# Patient Record
Sex: Male | Born: 1960
Health system: Southern US, Community
[De-identification: ages and names within clinical notes are randomized; demographics above are authoritative.]

## PROBLEM LIST (undated history)

## (undated) DIAGNOSIS — M1990 Unspecified osteoarthritis, unspecified site: Secondary | ICD-10-CM

## (undated) DIAGNOSIS — R0602 Shortness of breath: Secondary | ICD-10-CM

## (undated) DIAGNOSIS — K219 Gastro-esophageal reflux disease without esophagitis: Secondary | ICD-10-CM

## (undated) DIAGNOSIS — G4730 Sleep apnea, unspecified: Secondary | ICD-10-CM

## (undated) DIAGNOSIS — E669 Obesity, unspecified: Secondary | ICD-10-CM

## (undated) DIAGNOSIS — Z87442 Personal history of urinary calculi: Secondary | ICD-10-CM

## (undated) DIAGNOSIS — I10 Essential (primary) hypertension: Secondary | ICD-10-CM

## (undated) HISTORY — DX: Essential (primary) hypertension: I10

## (undated) HISTORY — PX: URETHRAL DILATION: SUR417

---

## 2000-09-17 ENCOUNTER — Ambulatory Visit (HOSPITAL_BASED_OUTPATIENT_CLINIC_OR_DEPARTMENT_OTHER): Admission: RE | Admit: 2000-09-17 | Discharge: 2000-09-17 | Payer: Self-pay

## 2002-04-09 ENCOUNTER — Ambulatory Visit (HOSPITAL_BASED_OUTPATIENT_CLINIC_OR_DEPARTMENT_OTHER): Admission: RE | Admit: 2002-04-09 | Discharge: 2002-04-09 | Payer: Self-pay

## 2005-02-13 ENCOUNTER — Ambulatory Visit: Payer: Self-pay | Admitting: Internal Medicine

## 2008-05-05 ENCOUNTER — Emergency Department (HOSPITAL_COMMUNITY): Admission: EM | Admit: 2008-05-05 | Discharge: 2008-05-05 | Payer: Self-pay | Admitting: Emergency Medicine

## 2009-01-25 ENCOUNTER — Ambulatory Visit (HOSPITAL_BASED_OUTPATIENT_CLINIC_OR_DEPARTMENT_OTHER): Admission: RE | Admit: 2009-01-25 | Discharge: 2009-01-25 | Payer: Self-pay | Admitting: Family Medicine

## 2011-04-13 NOTE — Procedures (Signed)
Corey [name], Corey [name]              ACCOUNT NO.:  [id number]   MEDICAL RECORD NO.:  [id number]          PATIENT TYPE:  OUT   LOCATION:  SLEEP CENTER                 FACILITY:  [facility]   PHYSICIAN:  Clinton D. Maple Hudson, MD, FCCP, FACPDATE OF BIRTH:  02-25-61   DATE OF STUDY:                            NOCTURNAL POLYSOMNOGRAM   REFERRING PHYSICIAN:  Tanya D. Daphine Deutscher, M.D.   INDICATIONS FOR STUDY:  Hypersomnia with sleep apnea.  Epworth  sleepiness score 20/24, BMI 35.4, weight 240 pounds, height 69 inches,  neck 17.5 inches.  Home medications are charted and reviewed.   SLEEP ARCHITECTURE:  Split study protocol.  During the diagnostic phase,  total sleep time was 140 minutes with sleep efficiency 90.3%.  Stage I  was 9.6%, stage II 90.4%.  Stages III and REM were absent.  Sleep  latency 3.5 minutes, awake after sleep onset 7 minutes, arousal index  67.3 indicating increased EEG arousal.  No bedtime medication was taken.   RESPIRATORY DATA:  Apnea/hypopnea index (AHI) 95.1 per hour.  A total of  222 events was scored including 63 obstructive apneas and 159 hypopneas.  Sleep position was indicated as supine.  CPAP was then titrated to 14  CWP, AHI zero per hour.  He wore a large ResMed Mirage micro-nasal mask  with chin strap and heated humidifier.   OXYGEN DATA:  Loud snoring before CPAP with oxygen desaturation to a  nadir of 81%.  After CPAP control, snoring was prevented and mean oxygen  saturation held 93.4% on room air.   CARDIAC DATA:  Normal sinus rhythm.   MOVEMENT/PARASOMNIA:  No significant movement disturbance.  No bathroom  trips.   IMPRESSION/RECOMMENDATION:  1. Severe obstructive sleep apnea/hypopnea syndrome, AHI 95.1 per      hour.  Loud snoring with oxygen desaturation to a nadir of 81% on      room air.  2. Successful CPAP titration to 14 CWP, AHI zero per hour.  He used a      Museum/gallery curator micro-nasal mask with chin strap and heated       humidifier.      Clinton D. Maple Hudson, MD, [facility], FACP  Diplomate, Biomedical engineer of Sleep Medicine  Electronically Signed     CDY/MEDQ  D:  01/29/2009 10:40:31  T:  01/30/2009 00:37:51  Job:  [id number]

## 2011-08-23 LAB — DIFFERENTIAL
Basophils Absolute: 0.1
Basophils Relative: 1
Eosinophils Absolute: 0
Eosinophils Relative: 0
Lymphocytes Relative: 9 — ABNORMAL LOW
Lymphs Abs: 1.3
Monocytes Absolute: 1.1 — ABNORMAL HIGH
Monocytes Relative: 7
Neutro Abs: 12.5 — ABNORMAL HIGH
Neutrophils Relative %: 84 — ABNORMAL HIGH

## 2011-08-23 LAB — CBC
HCT: 45.4
Hemoglobin: 15.9
MCHC: 34.9
MCV: 91.5
Platelets: 313
RBC: 4.97
RDW: 13.1
WBC: 15 — ABNORMAL HIGH

## 2011-08-23 LAB — URINALYSIS, ROUTINE W REFLEX MICROSCOPIC
Bilirubin Urine: NEGATIVE
Glucose, UA: NEGATIVE
Hgb urine dipstick: NEGATIVE
Ketones, ur: 15 — AB
Nitrite: NEGATIVE
Protein, ur: NEGATIVE
Specific Gravity, Urine: 1.025
Urobilinogen, UA: 0.2
pH: 5.5

## 2011-08-23 LAB — BASIC METABOLIC PANEL
BUN: 16
CO2: 23
Calcium: 9.4
Chloride: 109
Creatinine, Ser: 1.55 — ABNORMAL HIGH
GFR calc Af Amer: 59 — ABNORMAL LOW
GFR calc non Af Amer: 49 — ABNORMAL LOW
Glucose, Bld: 117 — ABNORMAL HIGH
Potassium: 4.8
Sodium: 140

## 2013-09-28 ENCOUNTER — Encounter: Payer: Self-pay | Admitting: Gastroenterology

## 2013-10-06 ENCOUNTER — Ambulatory Visit: Payer: Self-pay | Admitting: Gastroenterology

## 2013-12-04 ENCOUNTER — Ambulatory Visit: Payer: Self-pay | Admitting: Gastroenterology

## 2013-12-14 DIAGNOSIS — F411 Generalized anxiety disorder: Secondary | ICD-10-CM

## 2013-12-14 DIAGNOSIS — J209 Acute bronchitis, unspecified: Secondary | ICD-10-CM

## 2013-12-14 DIAGNOSIS — E291 Testicular hypofunction: Secondary | ICD-10-CM

## 2013-12-14 DIAGNOSIS — I10 Essential (primary) hypertension: Secondary | ICD-10-CM | POA: Insufficient documentation

## 2013-12-14 HISTORY — DX: Essential (primary) hypertension: I10

## 2013-12-14 HISTORY — DX: Acute bronchitis, unspecified: J20.9

## 2013-12-14 HISTORY — DX: Generalized anxiety disorder: F41.1

## 2013-12-14 HISTORY — DX: Testicular hypofunction: E29.1

## 2013-12-28 ENCOUNTER — Ambulatory Visit: Payer: Self-pay | Admitting: Gastroenterology

## 2014-01-14 ENCOUNTER — Encounter (HOSPITAL_BASED_OUTPATIENT_CLINIC_OR_DEPARTMENT_OTHER): Payer: Self-pay | Admitting: Emergency Medicine

## 2014-01-14 ENCOUNTER — Emergency Department (HOSPITAL_BASED_OUTPATIENT_CLINIC_OR_DEPARTMENT_OTHER)
Admission: EM | Admit: 2014-01-14 | Discharge: 2014-01-14 | Disposition: A | Discharge status: Home / Self Care | Payer: Worker's Compensation | Attending: Emergency Medicine | Admitting: Emergency Medicine

## 2014-01-14 ENCOUNTER — Emergency Department (HOSPITAL_BASED_OUTPATIENT_CLINIC_OR_DEPARTMENT_OTHER): Payer: Worker's Compensation

## 2014-01-14 DIAGNOSIS — Y9389 Activity, other specified: Secondary | ICD-10-CM | POA: Insufficient documentation

## 2014-01-14 DIAGNOSIS — S139XXA Sprain of joints and ligaments of unspecified parts of neck, initial encounter: Secondary | ICD-10-CM | POA: Insufficient documentation

## 2014-01-14 DIAGNOSIS — I10 Essential (primary) hypertension: Secondary | ICD-10-CM | POA: Insufficient documentation

## 2014-01-14 DIAGNOSIS — Z79899 Other long term (current) drug therapy: Secondary | ICD-10-CM | POA: Insufficient documentation

## 2014-01-14 DIAGNOSIS — S39012A Strain of muscle, fascia and tendon of lower back, initial encounter: Secondary | ICD-10-CM

## 2014-01-14 DIAGNOSIS — S161XXA Strain of muscle, fascia and tendon at neck level, initial encounter: Secondary | ICD-10-CM

## 2014-01-14 DIAGNOSIS — Y92410 Unspecified street and highway as the place of occurrence of the external cause: Secondary | ICD-10-CM | POA: Insufficient documentation

## 2014-01-14 DIAGNOSIS — S335XXA Sprain of ligaments of lumbar spine, initial encounter: Secondary | ICD-10-CM | POA: Insufficient documentation

## 2014-01-14 MED ORDER — OXYCODONE-ACETAMINOPHEN 5-325 MG PO TABS
2.0000 | ORAL_TABLET | Freq: Once | ORAL | Status: AC
  Administered 2014-01-14: 2 via ORAL
  Filled 2014-01-14: qty 2

## 2014-01-14 MED ORDER — OXYCODONE-ACETAMINOPHEN 5-325 MG PO TABS: 1.0000 | ORAL_TABLET | Freq: Four times a day (QID) | ORAL | Status: DC | PRN

## 2014-01-14 MED ORDER — NAPROXEN 500 MG PO TABS: 500.0000 mg | ORAL_TABLET | Freq: Two times a day (BID) | ORAL | Status: DC
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
## 2014-01-14 NOTE — Discharge Instructions (Signed)
Recommend you take naproxen as prescribed for symptoms. Apply ice to your sore areas 3-4 times per day to lessen inflammation. You may take Percocet as prescribed for breakthrough pain control. Followup with your primary care doctor. Refrain from strenuous activity or heavy lifting for one week. Return to the emergency department if symptoms worsen such as if you develop bowel or bladder incontinence, numbness of your inner thighs or genital region, or an inability to walk.  Cervical Sprain A cervical sprain is an injury in the neck in which the strong, fibrous tissues (ligaments) that connect your neck bones stretch or tear. Cervical sprains can range from mild to severe. Severe cervical sprains can cause the neck vertebrae to be unstable. This can lead to damage of the spinal cord and can result in serious nervous system problems. The amount of time it takes for a cervical sprain to get better depends on the cause and extent of the injury. Most cervical sprains heal in 1 to 3 weeks. CAUSES  Severe cervical sprains may be caused by:   Contact sport injuries (such as from football, rugby, wrestling, hockey, auto racing, gymnastics, diving, martial arts, or boxing).   Motor vehicle collisions.   Whiplash injuries. This is an injury from a sudden forward-and backward whipping movement of the head and neck.  Falls.  Mild cervical sprains may be caused by:   Being in an awkward position, such as while cradling a telephone between your ear and shoulder.   Sitting in a chair that does not offer proper support.   Working at a poorly Marketing executivedesigned computer station.   Looking up or down for long periods of time.  SYMPTOMS   Pain, soreness, stiffness, or a burning sensation in the front, back, or sides of the neck. This discomfort may develop immediately after the injury or slowly, 24 hours or more after the injury.   Pain or tenderness directly in the middle of the back of the neck.    Shoulder or upper back pain.   Limited ability to move the neck.   Headache.   Dizziness.   Weakness, numbness, or tingling in the hands or arms.   Muscle spasms.   Difficulty swallowing or chewing.   Tenderness and swelling of the neck.  DIAGNOSIS  Most of the time your health care provider can diagnose a cervical sprain by taking your history and doing a physical exam. Your health care provider will ask about previous neck injuries and any known neck problems, such as arthritis in the neck. X-rays may be taken to find out if there are any other problems, such as with the bones of the neck. Other tests, such as a CT scan or MRI, may also be needed.  TREATMENT  Treatment depends on the severity of the cervical sprain. Mild sprains can be treated with rest, keeping the neck in place (immobilization), and pain medicines. Severe cervical sprains are immediately immobilized. Further treatment is done to help with pain, muscle spasms, and other symptoms and may include:  Medicines, such as pain relievers, numbing medicines, or muscle relaxants.   Physical therapy. This may involve stretching exercises, strengthening exercises, and posture training. Exercises and improved posture can help stabilize the neck, strengthen muscles, and help stop symptoms from returning.  HOME CARE INSTRUCTIONS   Put ice on the injured area.   Put ice in a plastic bag.   Place a towel between your skin and the bag.   Leave the ice on for 15 20  minutes, 3 4 times a day.   If your injury was severe, you may have been given a cervical collar to wear. A cervical collar is a two-piece collar designed to keep your neck from moving while it heals.  Do not remove the collar unless instructed by your health care provider.  If you have long hair, keep it outside of the collar.  Ask your health care provider before making any adjustments to your collar. Minor adjustments may be required over time  to improve comfort and reduce pressure on your chin or on the back of your head.  Ifyou are allowed to remove the collar for cleaning or bathing, follow your health care provider's instructions on how to do so safely.  Keep your collar clean by wiping it with mild soap and water and drying it completely. If the collar you have been given includes removable pads, remove them every 1 2 days and hand wash them with soap and water. Allow them to air dry. They should be completely dry before you wear them in the collar.  If you are allowed to remove the collar for cleaning and bathing, wash and dry the skin of your neck. Check your skin for irritation or sores. If you see any, tell your health care provider.  Do not drive while wearing the collar.   Only take over-the-counter or prescription medicines for pain, discomfort, or fever as directed by your health care provider.   Keep all follow-up appointments as directed by your health care provider.   Keep all physical therapy appointments as directed by your health care provider.   Make any needed adjustments to your workstation to promote good posture.   Avoid positions and activities that make your symptoms worse.   Warm up and stretch before being active to help prevent problems.  SEEK MEDICAL CARE IF:   Your pain is not controlled with medicine.   You are unable to decrease your pain medicine over time as planned.   Your activity level is not improving as expected.  SEEK IMMEDIATE MEDICAL CARE IF:   You develop any bleeding.  You develop stomach upset.  You have signs of an allergic reaction to your medicine.   Your symptoms get worse.   You develop new, unexplained symptoms.   You have numbness, tingling, weakness, or paralysis in any part of your body.  MAKE SURE YOU:   Understand these instructions.  Will watch your condition.  Will get help right away if you are not doing well or get worse. Document  Released: 09/09/2007 Document Revised: 09/02/2013 Document Reviewed: 05/20/2013 [facility] Patient Information 2014 [place], [place].  Back Pain, Adult Back pain is very common. The pain often gets better over time. The cause of back pain is usually not dangerous. Most people can learn to manage their back pain on their own.  HOME CARE   Stay active. Start with short walks on flat ground if you can. Try to walk farther each day.  Do not sit, drive, or stand in one place for more than 30 minutes. Do not stay in bed.  Do not avoid exercise or work. Activity can help your back heal faster.  Be careful when you bend or lift an object. Bend at your knees, keep the object close to you, and do not twist.  Sleep on a firm mattress. Lie on your side, and bend your knees. If you lie on your back, put a pillow under your knees.  Only take medicines  as told by your doctor.  Put ice on the injured area.  Put ice in a plastic bag.  Place a towel between your skin and the bag.  Leave the ice on for 15-20 minutes, 03-04 times a day for the first 2 to 3 days. After that, you can switch between ice and heat packs.  Ask your doctor about back exercises or massage.  Avoid feeling anxious or stressed. Find good ways to deal with stress, such as exercise. GET HELP RIGHT AWAY IF:   Your pain does not go away with rest or medicine.  Your pain does not go away in 1 week.  You have new problems.  You do not feel well.  The pain spreads into your legs.  You cannot control when you poop (bowel movement) or pee (urinate).  Your arms or legs feel weak or lose feeling (numbness).  You feel sick to your stomach (nauseous) or throw up (vomit).  You have belly (abdominal) pain.  You feel like you may pass out (faint). MAKE SURE YOU:   Understand these instructions.  Will watch your condition.  Will get help right away if you are not doing well or get worse. Document Released: 04/30/2008 Document  Revised: 02/04/2012 Document Reviewed: 04/02/2011 [facility] Patient Information 2014 [place], [place].

## 2014-01-14 NOTE — ED Provider Notes (Signed)
Medical screening examination/treatment/procedure(s) were performed by non-physician practitioner and as supervising physician I was immediately available for consultation/collaboration.     Jasey Cortez, MD 01/14/14 2329 
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
## 2014-01-28 ENCOUNTER — Ambulatory Visit: Payer: Self-pay | Admitting: Gastroenterology

## 2014-05-17 ENCOUNTER — Other Ambulatory Visit: Payer: Self-pay | Admitting: Orthopedic Surgery

## 2014-05-20 ENCOUNTER — Encounter (HOSPITAL_COMMUNITY): Payer: Self-pay | Admitting: Pharmacy Technician

## 2014-05-22 NOTE — Pre-Procedure Instructions (Addendum)
Chinita PesterVernon T Biggins  05/22/2014   Your procedure is scheduled on:  July 9  [facility] at 08:00 AM.per carla (office)  Call this number if you have problems the morning of surgery: [phone]   Remember:   Do not eat food or drink liquids after midnight.   Take these medicines the morning of surgery with A SIP OF WATER: Mucinex, Flexeril       Take all meds as ordered until day of surgery except as instructed below or per dr   Despina AriasSTOP Naproxen July 4   STOP/ Do not take Aspirin, Aleve, Naproxen, Advil, Ibuprofen, Motrin, Vitamins, Herbs, or Supplements starting July 4   Do not wear jewelry, .  Do not wear lotions, powders, or perfumes. You may wear deodorant.  Do not shave 48 hours prior to surgery. Men may shave face and neck.  Do not bring valuables to the hospital.  [facility] is not responsible  for any belongings or valuables.               Contacts, dentures or bridgework may not be worn into surgery.  Leave suitcase in the car. After surgery it may be brought to your room.  For patients admitted to the hospital, discharge time is determined by your  treatment team.               Special Instructions: See [facility] Preparing For [facility]   Please read over the following fact sheets that you were given: Pain Booklet, Coughing and Deep Breathing, Blood Transfusion Information and Surgical Site Infection Prevention

## 2014-05-24 ENCOUNTER — Encounter (HOSPITAL_COMMUNITY)
Admission: RE | Admit: 2014-05-24 | Discharge: 2014-05-24 | Disposition: A | Discharge status: Home / Self Care | Payer: Worker's Compensation | Source: Ambulatory Visit | Attending: Orthopedic Surgery | Admitting: Orthopedic Surgery

## 2014-05-24 ENCOUNTER — Other Ambulatory Visit: Payer: Self-pay | Admitting: Orthopedic Surgery

## 2014-05-24 ENCOUNTER — Encounter (HOSPITAL_COMMUNITY): Payer: Self-pay

## 2014-05-24 DIAGNOSIS — Z01812 Encounter for preprocedural laboratory examination: Secondary | ICD-10-CM | POA: Insufficient documentation

## 2014-05-24 HISTORY — DX: Sleep apnea, unspecified: G47.30

## 2014-05-24 HISTORY — DX: Personal history of urinary calculi: Z87.442

## 2014-05-24 HISTORY — DX: Gastro-esophageal reflux disease without esophagitis: K21.9

## 2014-05-24 HISTORY — DX: Unspecified osteoarthritis, unspecified site: M19.90

## 2014-05-24 HISTORY — DX: Shortness of breath: R06.02

## 2014-05-24 LAB — URINALYSIS, ROUTINE W REFLEX MICROSCOPIC
Bilirubin Urine: NEGATIVE
Glucose, UA: NEGATIVE mg/dL
Hgb urine dipstick: NEGATIVE
Ketones, ur: NEGATIVE mg/dL
Leukocytes, UA: NEGATIVE
Nitrite: NEGATIVE
Protein, ur: NEGATIVE mg/dL
Specific Gravity, Urine: 1.036 — ABNORMAL HIGH (ref 1.005–1.030)
Urobilinogen, UA: 0.2 mg/dL (ref 0.0–1.0)
pH: 5.5 (ref 5.0–8.0)

## 2014-05-24 LAB — COMPREHENSIVE METABOLIC PANEL
ALT: 27 U/L (ref 0–53)
AST: 22 U/L (ref 0–37)
Albumin: 4.1 g/dL (ref 3.5–5.2)
Alkaline Phosphatase: 88 U/L (ref 39–117)
BUN: 19 mg/dL (ref 6–23)
CO2: 26 mEq/L (ref 19–32)
Calcium: 9.3 mg/dL (ref 8.4–10.5)
Chloride: 106 mEq/L (ref 96–112)
Creatinine, Ser: 1.12 mg/dL (ref 0.50–1.35)
GFR calc Af Amer: 86 mL/min — ABNORMAL LOW (ref >90)
GFR calc non Af Amer: 74 mL/min — ABNORMAL LOW (ref >90)
Glucose, Bld: 96 mg/dL (ref 70–99)
Potassium: 4.6 mEq/L (ref 3.7–5.3)
Sodium: 142 mEq/L (ref 137–147)
Total Bilirubin: <0.2 mg/dL — ABNORMAL LOW (ref 0.3–1.2)
Total Protein: 7.4 g/dL (ref 6.0–8.3)

## 2014-05-24 LAB — CBC WITH DIFFERENTIAL/PLATELET
Basophils Absolute: 0 10*3/uL (ref 0.0–0.1)
Basophils Relative: 0 % (ref 0–1)
Eosinophils Absolute: 0.4 10*3/uL (ref 0.0–0.7)
Eosinophils Relative: 5 % (ref 0–5)
HCT: 43.6 % (ref 39.0–52.0)
Hemoglobin: 14.6 g/dL (ref 13.0–17.0)
Lymphocytes Relative: 30 % (ref 12–46)
Lymphs Abs: 2.4 10*3/uL (ref 0.7–4.0)
MCH: 30.5 pg (ref 26.0–34.0)
MCHC: 33.5 g/dL (ref 30.0–36.0)
MCV: 91.2 fL (ref 78.0–100.0)
Monocytes Absolute: 0.7 10*3/uL (ref 0.1–1.0)
Monocytes Relative: 8 % (ref 3–12)
Neutro Abs: 4.6 10*3/uL (ref 1.7–7.7)
Neutrophils Relative %: 57 % (ref 43–77)
Platelets: 260 10*3/uL (ref 150–400)
RBC: 4.78 MIL/uL (ref 4.22–5.81)
RDW: 13.2 % (ref 11.5–15.5)
WBC: 8.1 10*3/uL (ref 4.0–10.5)

## 2014-05-24 LAB — SURGICAL PCR SCREEN
MRSA, PCR: NEGATIVE
Staphylococcus aureus: POSITIVE — AB

## 2014-05-24 LAB — APTT: aPTT: 32 seconds (ref 24–37)

## 2014-05-24 LAB — PROTIME-INR
INR: 1 (ref 0.00–1.49)
Prothrombin Time: 13.2 seconds (ref 11.6–15.2)

## 2014-05-24 NOTE — Progress Notes (Signed)
req'd ekg,cxr (recent this yr), [place](2 yrs[place] [facility] [facility]

## 2014-05-24 NOTE — Progress Notes (Signed)
Mupirocin ointment rx called into [facility] on [address] for positive staph PCR

## 2014-06-02 MED ORDER — POVIDONE-IODINE 7.5 % EX SOLN
Freq: Once | CUTANEOUS | Status: DC
  Filled 2014-06-02: qty 118

## 2014-06-02 MED ORDER — CEFAZOLIN SODIUM-DEXTROSE 2-3 GM-% IV SOLR
2.0000 g | INTRAVENOUS | Status: AC
  Administered 2014-06-03: 2 g via INTRAVENOUS
  Filled 2014-06-02: qty 50

## 2014-06-02 NOTE — H&P (Signed)
     PREOPERATIVE H&P  Chief Complaint: bilateral hand tinging and humbness  HPI: Corey [name] is a 53 y.o. male who presents with ongoing myelopathic symptoms  MRI reveals SCC C56 and C67  Patient has failed multiple forms of conservative care and continues to have pain (see office notes for additional details regarding the patient's full course of treatment)  Past Medical History  Diagnosis Date  . Hypertension   . Sleep apnea     cpap 16 yrs  . Shortness of breath     occ-allergies  . History of kidney stones   . GERD (gastroesophageal reflux disease)     occ  . Arthritis    Past Surgical History  Procedure Laterality Date  . Urethral dilation      child   History   Social History  . Marital Status: Married    Spouse Name: N/A    Number of Children: N/A  . Years of Education: N/A   Social History Main Topics  . Smoking status: Former Smoker -- 2.00 packs/day for 30 years    Types: Cigarettes    Quit date: 05/24/2012  . Smokeless tobacco: Not on file  . Alcohol Use: No  . Drug Use: No  . Sexual Activity: Not on file   Other Topics Concern  . Not on file   Social History Narrative  . No narrative on file   No family history on file. No Known Allergies Prior to Admission medications   Medication Sig Start Date End Date Taking? Authorizing Provider  ALPRAZolam Prudy Feeler(XANAX) 1 MG tablet Take 0.5 mg by mouth at bedtime as needed for anxiety or sleep.   Yes Historical Provider, MD  buPROPion (WELLBUTRIN XL) 300 MG 24 hr tablet Take 300 mg by mouth at bedtime.   Yes Historical Provider, MD  cyclobenzaprine (FLEXERIL) 5 MG tablet Take 5-10 mg by mouth 2 (two) times daily. Take 5mg  in the morning and 10mg  at bedtime.   Yes Historical Provider, MD  Dextromethorphan-Guaifenesin (MUCINEX DM MAXIMUM STRENGTH PO) Take 1 tablet by mouth 2 (two) times daily.   Yes Historical Provider, MD  naproxen (NAPROSYN) 500 MG tablet Take 1 tablet (500 mg total) by mouth 2 (two)  times daily with a meal. 01/14/14  Yes Antony MaduraKelly Humes, PA-C  Testosterone (ANDROGEL PUMP) 20.25 MG/ACT (1.62%) GEL Place 2 g onto the skin every morning.   Yes Historical Provider, MD  valsartan (DIOVAN) 320 MG tablet Take 320 mg by mouth daily.   Yes Historical Provider, MD     All other systems have been reviewed and were otherwise negative with the exception of those mentioned in the HPI and as above.  Physical Exam: There were no vitals filed for this visit.  General: Alert, no acute distress Cardiovascular: No pedal edema Respiratory: No cyanosis, no use of accessory musculature Skin: No lesions in the area of chief complaint Neurologic: Sensation intact distally Psychiatric: Patient is competent for consent with normal mood and affect Lymphatic: No axillary or cervical lymphadenopathy  MUSCULOSKELETAL: decreased sensation to light touch bilateral hands  Assessment/Plan: myelopathy Plan for Procedure(s): ANTERIOR CERVICAL DECOMPRESSION/DISCECTOMY FUSION 2 LEVELS   Emilee HeroUMONSKI,Casten Floren LEONARD, MD 06/02/2014 3:31 PM

## 2014-06-03 ENCOUNTER — Observation Stay (HOSPITAL_COMMUNITY): Payer: Worker's Compensation

## 2014-06-03 ENCOUNTER — Inpatient Hospital Stay (HOSPITAL_COMMUNITY): Payer: Worker's Compensation

## 2014-06-03 ENCOUNTER — Encounter (HOSPITAL_COMMUNITY)
Admission: RE | Disposition: A | Discharge status: Home / Self Care | Payer: Worker's Compensation | Source: Ambulatory Visit | Attending: Orthopedic Surgery

## 2014-06-03 ENCOUNTER — Observation Stay (HOSPITAL_COMMUNITY)
Admission: RE | Admit: 2014-06-03 | Discharge: 2014-06-04 | Disposition: A | Discharge status: Home / Self Care | Payer: Worker's Compensation | Source: Ambulatory Visit | Attending: Orthopedic Surgery | Admitting: Orthopedic Surgery

## 2014-06-03 ENCOUNTER — Encounter (HOSPITAL_COMMUNITY): Payer: Self-pay | Admitting: Certified Registered Nurse Anesthetist

## 2014-06-03 ENCOUNTER — Encounter (HOSPITAL_COMMUNITY): Payer: Worker's Compensation | Admitting: Anesthesiology

## 2014-06-03 ENCOUNTER — Inpatient Hospital Stay (HOSPITAL_COMMUNITY): Payer: Worker's Compensation | Admitting: Anesthesiology

## 2014-06-03 DIAGNOSIS — G4730 Sleep apnea, unspecified: Secondary | ICD-10-CM | POA: Insufficient documentation

## 2014-06-03 DIAGNOSIS — G959 Disease of spinal cord, unspecified: Secondary | ICD-10-CM

## 2014-06-03 DIAGNOSIS — K219 Gastro-esophageal reflux disease without esophagitis: Secondary | ICD-10-CM | POA: Insufficient documentation

## 2014-06-03 DIAGNOSIS — I10 Essential (primary) hypertension: Secondary | ICD-10-CM | POA: Insufficient documentation

## 2014-06-03 DIAGNOSIS — M129 Arthropathy, unspecified: Secondary | ICD-10-CM | POA: Insufficient documentation

## 2014-06-03 DIAGNOSIS — M4712 Other spondylosis with myelopathy, cervical region: Principal | ICD-10-CM | POA: Insufficient documentation

## 2014-06-03 DIAGNOSIS — Z87891 Personal history of nicotine dependence: Secondary | ICD-10-CM | POA: Insufficient documentation

## 2014-06-03 HISTORY — PX: ANTERIOR CERVICAL DECOMP/DISCECTOMY FUSION: SHX1161

## 2014-06-03 HISTORY — DX: Disease of spinal cord, unspecified: G95.9

## 2014-06-03 LAB — ABO/RH: ABO/RH(D): O POS

## 2014-06-03 LAB — TYPE AND SCREEN
ABO/RH(D): O POS
Antibody Screen: NEGATIVE

## 2014-06-03 SURGERY — ANTERIOR CERVICAL DECOMPRESSION/DISCECTOMY FUSION 2 LEVELS
Anesthesia: General | Site: Neck

## 2014-06-03 MED ORDER — ROCURONIUM BROMIDE 100 MG/10ML IV SOLN
INTRAVENOUS | Status: DC | PRN
  Administered 2014-06-03: 50 mg via INTRAVENOUS

## 2014-06-03 MED ORDER — SODIUM CHLORIDE 0.9 % IV SOLN
250.0000 mL | INTRAVENOUS | Status: DC
Start: 2014-06-03 — End: 2014-06-04

## 2014-06-03 MED ORDER — FENTANYL CITRATE 0.05 MG/ML IJ SOLN
INTRAMUSCULAR | Status: DC | PRN
  Administered 2014-06-03: 50 ug via INTRAVENOUS
  Administered 2014-06-03: 100 ug via INTRAVENOUS
  Administered 2014-06-03 (×3): 50 ug via INTRAVENOUS
  Administered 2014-06-03: 150 ug via INTRAVENOUS

## 2014-06-03 MED ORDER — GLYCOPYRROLATE 0.2 MG/ML IJ SOLN
INTRAMUSCULAR | Status: DC | PRN
  Administered 2014-06-03: 0.6 mg via INTRAVENOUS

## 2014-06-03 MED ORDER — FENTANYL CITRATE 0.05 MG/ML IJ SOLN
INTRAMUSCULAR | Status: AC
  Filled 2014-06-03: qty 5

## 2014-06-03 MED ORDER — PROMETHAZINE HCL 25 MG/ML IJ SOLN: 6.2500 mg | INTRAMUSCULAR | Status: DC | PRN

## 2014-06-03 MED ORDER — ONDANSETRON HCL 4 MG/2ML IJ SOLN
INTRAMUSCULAR | Status: AC
  Filled 2014-06-03: qty 2

## 2014-06-03 MED ORDER — THROMBIN 20000 UNITS EX SOLR
CUTANEOUS | Status: DC | PRN
  Administered 2014-06-03: 20000 [IU] via TOPICAL

## 2014-06-03 MED ORDER — HYDROMORPHONE HCL PF 1 MG/ML IJ SOLN
INTRAMUSCULAR | Status: AC
  Filled 2014-06-03: qty 1

## 2014-06-03 MED ORDER — OXYCODONE-ACETAMINOPHEN 5-325 MG PO TABS
1.0000 | ORAL_TABLET | ORAL | Status: DC | PRN
Start: 2014-06-03 — End: 2014-06-04
  Administered 2014-06-03 – 2014-06-04 (×3): 2 via ORAL
  Filled 2014-06-03 (×3): qty 2

## 2014-06-03 MED ORDER — SODIUM CHLORIDE 0.9 % IJ SOLN: 3.0000 mL | INTRAMUSCULAR | Status: DC | PRN

## 2014-06-03 MED ORDER — ACETAMINOPHEN 650 MG RE SUPP: 650.0000 mg | RECTAL | Status: DC | PRN

## 2014-06-03 MED ORDER — NEOSTIGMINE METHYLSULFATE 10 MG/10ML IV SOLN
INTRAVENOUS | Status: DC | PRN
  Administered 2014-06-03: 4 mg via INTRAVENOUS

## 2014-06-03 MED ORDER — LACTATED RINGERS IV SOLN
INTRAVENOUS | Status: DC
  Administered 2014-06-03: 08:00:00 via INTRAVENOUS

## 2014-06-03 MED ORDER — PROPOFOL 10 MG/ML IV BOLUS
INTRAVENOUS | Status: AC
  Filled 2014-06-03: qty 20

## 2014-06-03 MED ORDER — CEFAZOLIN SODIUM 1-5 GM-% IV SOLN
1.0000 g | Freq: Three times a day (TID) | INTRAVENOUS | Status: AC
  Administered 2014-06-03 – 2014-06-04 (×2): 1 g via INTRAVENOUS
  Filled 2014-06-03 (×2): qty 50

## 2014-06-03 MED ORDER — NEOSTIGMINE METHYLSULFATE 10 MG/10ML IV SOLN
INTRAVENOUS | Status: AC
  Filled 2014-06-03: qty 2

## 2014-06-03 MED ORDER — ALPRAZOLAM 0.5 MG PO TABS: 0.5000 mg | ORAL_TABLET | Freq: Every evening | ORAL | Status: DC | PRN

## 2014-06-03 MED ORDER — ACETAMINOPHEN 325 MG PO TABS: 650.0000 mg | ORAL_TABLET | ORAL | Status: DC | PRN

## 2014-06-03 MED ORDER — SODIUM CHLORIDE 0.9 % IJ SOLN
3.0000 mL | Freq: Two times a day (BID) | INTRAMUSCULAR | Status: DC
  Administered 2014-06-03: 3 mL via INTRAVENOUS

## 2014-06-03 MED ORDER — OXYCODONE HCL 5 MG/5ML PO SOLN: 5.0000 mg | Freq: Once | ORAL | Status: DC | PRN

## 2014-06-03 MED ORDER — ESMOLOL HCL 10 MG/ML IV SOLN
INTRAVENOUS | Status: AC
  Filled 2014-06-03: qty 10

## 2014-06-03 MED ORDER — SUCCINYLCHOLINE CHLORIDE 20 MG/ML IJ SOLN
INTRAMUSCULAR | Status: DC | PRN
  Administered 2014-06-03: 100 mg via INTRAVENOUS

## 2014-06-03 MED ORDER — BUPIVACAINE-EPINEPHRINE (PF) 0.25% -1:200000 IJ SOLN
INTRAMUSCULAR | Status: AC
  Filled 2014-06-03: qty 30

## 2014-06-03 MED ORDER — PROPOFOL 10 MG/ML IV BOLUS
INTRAVENOUS | Status: DC | PRN
  Administered 2014-06-03: 200 mg via INTRAVENOUS

## 2014-06-03 MED ORDER — SENNOSIDES-DOCUSATE SODIUM 8.6-50 MG PO TABS
1.0000 | ORAL_TABLET | Freq: Every evening | ORAL | Status: DC | PRN
  Filled 2014-06-03: qty 1

## 2014-06-03 MED ORDER — IRBESARTAN 300 MG PO TABS
300.0000 mg | ORAL_TABLET | Freq: Every day | ORAL | Status: DC
  Filled 2014-06-03 (×2): qty 1

## 2014-06-03 MED ORDER — OXYCODONE HCL 5 MG PO TABS: 5.0000 mg | ORAL_TABLET | Freq: Once | ORAL | Status: DC | PRN

## 2014-06-03 MED ORDER — GLYCOPYRROLATE 0.2 MG/ML IJ SOLN
INTRAMUSCULAR | Status: AC
  Filled 2014-06-03: qty 4

## 2014-06-03 MED ORDER — ROCURONIUM BROMIDE 50 MG/5ML IV SOLN
INTRAVENOUS | Status: AC
  Filled 2014-06-03: qty 1

## 2014-06-03 MED ORDER — MORPHINE SULFATE 2 MG/ML IJ SOLN
1.0000 mg | INTRAMUSCULAR | Status: DC | PRN
  Administered 2014-06-03: 2 mg via INTRAVENOUS
  Filled 2014-06-03: qty 1

## 2014-06-03 MED ORDER — DIAZEPAM 5 MG PO TABS
5.0000 mg | ORAL_TABLET | Freq: Four times a day (QID) | ORAL | Status: DC | PRN
  Administered 2014-06-03 – 2014-06-04 (×3): 5 mg via ORAL
  Filled 2014-06-03 (×3): qty 1

## 2014-06-03 MED ORDER — PHENOL 1.4 % MT LIQD: 1.0000 | OROMUCOSAL | Status: DC | PRN

## 2014-06-03 MED ORDER — ONDANSETRON HCL 4 MG/2ML IJ SOLN
INTRAMUSCULAR | Status: DC | PRN
  Administered 2014-06-03: 4 mg via INTRAVENOUS

## 2014-06-03 MED ORDER — BUPROPION HCL ER (XL) 300 MG PO TB24
300.0000 mg | ORAL_TABLET | Freq: Every day | ORAL | Status: DC
  Administered 2014-06-03: 300 mg via ORAL
  Filled 2014-06-03 (×2): qty 1

## 2014-06-03 MED ORDER — LACTATED RINGERS IV SOLN
INTRAVENOUS | Status: DC | PRN
  Administered 2014-06-03: 08:00:00 via INTRAVENOUS

## 2014-06-03 MED ORDER — DOCUSATE SODIUM 100 MG PO CAPS
100.0000 mg | ORAL_CAPSULE | Freq: Two times a day (BID) | ORAL | Status: DC
  Administered 2014-06-03: 100 mg via ORAL
  Filled 2014-06-03 (×3): qty 1

## 2014-06-03 MED ORDER — FLEET ENEMA 7-19 GM/118ML RE ENEM
1.0000 | ENEMA | Freq: Once | RECTAL | Status: AC | PRN
  Filled 2014-06-03: qty 1

## 2014-06-03 MED ORDER — MIDAZOLAM HCL 2 MG/2ML IJ SOLN
INTRAMUSCULAR | Status: AC
  Filled 2014-06-03: qty 2

## 2014-06-03 MED ORDER — HEMOSTATIC AGENTS (NO CHARGE) OPTIME
TOPICAL | Status: DC | PRN
  Administered 2014-06-03: 1

## 2014-06-03 MED ORDER — LIDOCAINE HCL (CARDIAC) 20 MG/ML IV SOLN
INTRAVENOUS | Status: DC | PRN
  Administered 2014-06-03: 100 mg via INTRAVENOUS

## 2014-06-03 MED ORDER — ONDANSETRON HCL 4 MG/2ML IJ SOLN
4.0000 mg | INTRAMUSCULAR | Status: DC | PRN
  Administered 2014-06-03: 4 mg via INTRAVENOUS
  Filled 2014-06-03: qty 2

## 2014-06-03 MED ORDER — BISACODYL 5 MG PO TBEC
5.0000 mg | DELAYED_RELEASE_TABLET | Freq: Every day | ORAL | Status: DC | PRN
  Filled 2014-06-03: qty 1

## 2014-06-03 MED ORDER — HYDROMORPHONE HCL PF 1 MG/ML IJ SOLN
0.2500 mg | INTRAMUSCULAR | Status: DC | PRN
  Administered 2014-06-03 (×4): 0.5 mg via INTRAVENOUS

## 2014-06-03 MED ORDER — THROMBIN 20000 UNITS EX KIT: PACK | CUTANEOUS | Status: DC | PRN

## 2014-06-03 MED ORDER — THROMBIN 20000 UNITS EX SOLR
CUTANEOUS | Status: AC
  Filled 2014-06-03: qty 20000

## 2014-06-03 MED ORDER — MENTHOL 3 MG MT LOZG: 1.0000 | LOZENGE | OROMUCOSAL | Status: DC | PRN

## 2014-06-03 MED ORDER — LIDOCAINE HCL (CARDIAC) 20 MG/ML IV SOLN
INTRAVENOUS | Status: AC
  Filled 2014-06-03: qty 10

## 2014-06-03 MED ORDER — ALBUTEROL SULFATE (2.5 MG/3ML) 0.083% IN NEBU: 3.0000 mL | INHALATION_SOLUTION | Freq: Four times a day (QID) | RESPIRATORY_TRACT | Status: DC | PRN

## 2014-06-03 MED ORDER — MIDAZOLAM HCL 5 MG/5ML IJ SOLN
INTRAMUSCULAR | Status: DC | PRN
  Administered 2014-06-03: 2 mg via INTRAVENOUS

## 2014-06-03 MED ORDER — BUPIVACAINE-EPINEPHRINE 0.25% -1:200000 IJ SOLN
INTRAMUSCULAR | Status: DC | PRN
  Administered 2014-06-03: 2.5 mL

## 2014-06-03 MED ORDER — MIDAZOLAM HCL 2 MG/2ML IJ SOLN
1.0000 mg | INTRAMUSCULAR | Status: DC | PRN
  Administered 2014-06-03: 2 mg via INTRAVENOUS
  Filled 2014-06-03: qty 2

## 2014-06-03 MED ORDER — 0.9 % SODIUM CHLORIDE (POUR BTL) OPTIME
TOPICAL | Status: DC | PRN
  Administered 2014-06-03: 1000 mL

## 2014-06-03 MED ORDER — ALUM & MAG HYDROXIDE-SIMETH 200-200-20 MG/5ML PO SUSP
30.0000 mL | Freq: Four times a day (QID) | ORAL | Status: DC | PRN
  Administered 2014-06-03 – 2014-06-04 (×2): 30 mL via ORAL
  Filled 2014-06-03 (×2): qty 30

## 2014-06-03 SURGICAL SUPPLY — 80 items
APL SKNCLS STERI-STRIP NONHPOA (GAUZE/BANDAGES/DRESSINGS) ×1
BENZOIN TINCTURE PRP APPL 2/3 (GAUZE/BANDAGES/DRESSINGS) ×3 IMPLANT
BIT DRILL NEURO 2X3.1 SFT TUCH (MISCELLANEOUS) ×1 IMPLANT
BIT DRILL SRG 14X2.2XFLT CHK (BIT) IMPLANT
BIT DRL SRG 14X2.2XFLT CHK (BIT) ×1
BLADE SURG 15 STRL LF DISP TIS (BLADE) ×1 IMPLANT
BLADE SURG 15 STRL SS (BLADE) ×3
BLADE SURG ROTATE 9660 (MISCELLANEOUS) ×5 IMPLANT
BUR MATCHSTICK NEURO 3.0 LAGG (BURR) IMPLANT
CAGE 16X6X14 ENDOSKEL MED (Orthopedic Implant) IMPLANT
CARTRIDGE OIL MAESTRO DRILL (MISCELLANEOUS) ×1 IMPLANT
CLOSURE WOUND 1/2 X4 (GAUZE/BANDAGES/DRESSINGS) ×1
COLLAR CERV LO CONTOUR FIRM DE (SOFTGOODS) IMPLANT
CORDS BIPOLAR (ELECTRODE) ×3 IMPLANT
COVER SURGICAL LIGHT HANDLE (MISCELLANEOUS) ×3 IMPLANT
CRADLE DONUT ADULT HEAD (MISCELLANEOUS) ×3 IMPLANT
DEVICE ENDSKLTN CRVCL 5MM-0MED (Orthopedic Implant) IMPLANT
DIFFUSER DRILL AIR PNEUMATIC (MISCELLANEOUS) ×3 IMPLANT
DRAIN JACKSON RD 7FR 3/32 (WOUND CARE) IMPLANT
DRAPE C-ARM 42X72 X-RAY (DRAPES) ×3 IMPLANT
DRAPE POUCH INSTRU U-SHP 10X18 (DRAPES) ×3 IMPLANT
DRAPE SURG 17X23 STRL (DRAPES) ×9 IMPLANT
DRILL BIT SKYLINE 14MM (BIT) ×3
DRILL NEURO 2X3.1 SOFT TOUCH (MISCELLANEOUS) ×3
DURAPREP 26ML APPLICATOR (WOUND CARE) ×3 IMPLANT
ELECT COATED BLADE 2.86 ST (ELECTRODE) ×3 IMPLANT
ELECT REM PT RETURN 9FT ADLT (ELECTROSURGICAL) ×3
ELECTRODE REM PT RTRN 9FT ADLT (ELECTROSURGICAL) ×1 IMPLANT
ENDOSKELETON CERVICAL 5MM-0MED (Orthopedic Implant) ×3 IMPLANT
ENDOSKELETON IMPLANT MED 6M-0 (Orthopedic Implant) ×3 IMPLANT
EVACUATOR SILICONE 100CC (DRAIN) IMPLANT
GAUZE SPONGE 4X4 16PLY XRAY LF (GAUZE/BANDAGES/DRESSINGS) ×3 IMPLANT
GLOVE BIO SURGEON STRL SZ7 (GLOVE) ×7 IMPLANT
GLOVE BIO SURGEON STRL SZ8 (GLOVE) ×3 IMPLANT
GLOVE BIOGEL PI IND STRL 7.0 (GLOVE) IMPLANT
GLOVE BIOGEL PI IND STRL 7.5 (GLOVE) ×2 IMPLANT
GLOVE BIOGEL PI IND STRL 8 (GLOVE) ×1 IMPLANT
GLOVE BIOGEL PI INDICATOR 7.0 (GLOVE) ×6
GLOVE BIOGEL PI INDICATOR 7.5 (GLOVE) ×4
GLOVE BIOGEL PI INDICATOR 8 (GLOVE) ×2
GLOVE SURG SS PI 7.0 STRL IVOR (GLOVE) ×4 IMPLANT
GOWN STRL REUS W/ TWL LRG LVL3 (GOWN DISPOSABLE) ×1 IMPLANT
GOWN STRL REUS W/ TWL XL LVL3 (GOWN DISPOSABLE) ×1 IMPLANT
GOWN STRL REUS W/TWL LRG LVL3 (GOWN DISPOSABLE) ×12
GOWN STRL REUS W/TWL XL LVL3 (GOWN DISPOSABLE) ×3
IV CATH 14GX2 1/4 (CATHETERS) ×3 IMPLANT
KIT BASIN OR (CUSTOM PROCEDURE TRAY) ×3 IMPLANT
KIT ROOM TURNOVER OR (KITS) ×3 IMPLANT
MARKER SKIN DUAL TIP RULER LAB (MISCELLANEOUS) ×2 IMPLANT
NDL SPNL 20GX3.5 QUINCKE YW (NEEDLE) ×1 IMPLANT
NEEDLE 27GAX1X1/2 (NEEDLE) ×3 IMPLANT
NEEDLE SPNL 20GX3.5 QUINCKE YW (NEEDLE) ×3 IMPLANT
NS IRRIG 1000ML POUR BTL (IV SOLUTION) ×3 IMPLANT
OIL CARTRIDGE MAESTRO DRILL (MISCELLANEOUS) ×3
PACK ORTHO CERVICAL (CUSTOM PROCEDURE TRAY) ×3 IMPLANT
PAD ARMBOARD 7.5X6 YLW CONV (MISCELLANEOUS) ×6 IMPLANT
PATTIES SURGICAL .5 X.5 (GAUZE/BANDAGES/DRESSINGS) ×2 IMPLANT
PATTIES SURGICAL .5 X1 (DISPOSABLE) IMPLANT
PIN DISTRACTION 14 (PIN) ×4 IMPLANT
PLATE TWO LEVEL SKYLINE 30MM (Plate) ×2 IMPLANT
PUTTY BONE DBX 2.5 MIS (Bone Implant) ×2 IMPLANT
SCREW VAR SELF TAP SKYLINE 14M (Screw) ×12 IMPLANT
SPONGE GAUZE 4X4 12PLY (GAUZE/BANDAGES/DRESSINGS) ×3 IMPLANT
SPONGE INTESTINAL PEANUT (DISPOSABLE) ×3 IMPLANT
SPONGE SURGIFOAM ABS GEL 100 (HEMOSTASIS) ×3 IMPLANT
STRIP CLOSURE SKIN 1/2X4 (GAUZE/BANDAGES/DRESSINGS) ×2 IMPLANT
SURGIFLO TRUKIT (HEMOSTASIS) IMPLANT
SUT MNCRL AB 4-0 PS2 18 (SUTURE) ×3 IMPLANT
SUT SILK 4 0 (SUTURE)
SUT SILK 4-0 18XBRD TIE 12 (SUTURE) IMPLANT
SUT VIC AB 2-0 CT2 18 VCP726D (SUTURE) ×3 IMPLANT
SYR BULB IRRIGATION 50ML (SYRINGE) ×3 IMPLANT
SYR CONTROL 10ML LL (SYRINGE) ×6 IMPLANT
TAPE CLOTH 4X10 WHT NS (GAUZE/BANDAGES/DRESSINGS) ×3 IMPLANT
TAPE CLOTH SURG 4X10 WHT LF (GAUZE/BANDAGES/DRESSINGS) ×2 IMPLANT
TAPE UMBILICAL COTTON 1/8X30 (MISCELLANEOUS) ×3 IMPLANT
TOWEL OR 17X24 6PK STRL BLUE (TOWEL DISPOSABLE) ×3 IMPLANT
TOWEL OR 17X26 10 PK STRL BLUE (TOWEL DISPOSABLE) ×3 IMPLANT
WATER STERILE IRR 1000ML POUR (IV SOLUTION) ×1 IMPLANT
YANKAUER SUCT BULB TIP NO VENT (SUCTIONS) ×3 IMPLANT

## 2014-06-03 NOTE — Anesthesia Procedure Notes (Signed)
Procedure Name: Intubation Date/Time: 06/03/2014 10:23 AM Performed by: Vita BarleyURNER, Olaf Mesa E Pre-anesthesia Checklist: Patient identified, Emergency Drugs available, Suction available and Patient being monitored Patient Re-evaluated:Patient Re-evaluated prior to inductionOxygen Delivery Method: Circle system utilized Preoxygenation: Pre-oxygenation with 100% oxygen Intubation Type: IV induction Ventilation: Mask ventilation without difficulty and Oral airway inserted - appropriate to patient size Laryngoscope Size: Hyacinth MeekerMiller and 2 Grade View: Grade II Tube type: Oral Tube size: 7.5 mm Number of attempts: 1 Airway Equipment and Method: Stylet and Oral airway Placement Confirmation: ETT inserted through vocal cords under direct vision,  positive ETCO2 and breath sounds checked- equal and bilateral Secured at: 23 cm Tube secured with: Tape Dental Injury: Teeth and Oropharynx as per pre-operative assessment

## 2014-06-03 NOTE — Progress Notes (Signed)
Pt placed on CPAP of 16 cmH2O home settings with pt home nasal mask. Pt tolerating well at this time.

## 2014-06-03 NOTE — Anesthesia Preprocedure Evaluation (Addendum)
Anesthesia Evaluation  Patient identified by MRN, date of birth, ID band Patient awake    Reviewed: Allergy & Precautions, H&P , NPO status , Patient's Chart, lab work & pertinent test results  History of Anesthesia Complications Negative for: history of anesthetic complications  Airway Mallampati: II TM Distance: >3 FB Neck ROM: Full    Dental  (+) Partial Lower, Partial Upper, Dental Advisory Given,    Pulmonary sleep apnea and Continuous Positive Airway Pressure Ventilation , former smoker,    Pulmonary exam normal       Cardiovascular hypertension,     Neuro/Psych negative neurological ROS  negative psych ROS   GI/Hepatic Neg liver ROS, GERD-  Medicated,  Endo/Other  Morbid obesity  Renal/GU negative Renal ROS     Musculoskeletal   Abdominal   Peds  Hematology   Anesthesia Other Findings   Reproductive/Obstetrics                        Anesthesia Physical Anesthesia Plan  ASA: II  Anesthesia Plan: General   Post-op Pain Management:    Induction: Intravenous  Airway Management Planned: Oral ETT  Additional Equipment:   Intra-op Plan:   Post-operative Plan: Extubation in OR  Informed Consent: I have reviewed the patients History and Physical, chart, labs and discussed the procedure including the risks, benefits and alternatives for the proposed anesthesia with the patient or authorized representative who has indicated his/her understanding and acceptance.   Dental advisory given  Plan Discussed with: CRNA, Anesthesiologist and Surgeon  Anesthesia Plan Comments:       Anesthesia Quick Evaluation

## 2014-06-03 NOTE — Anesthesia Postprocedure Evaluation (Signed)
Anesthesia Post Note  Patient: Corey [name]  Procedure(s) Performed: Procedure(s) (LRB): ANTERIOR CERVICAL DECOMPRESSION FUSION, CERVICAL FIVE-SIX, CERVICAL SIX-SEVEN WITH INSTRUMENTATION AND ALLOGRAFT (N/A)  Anesthesia type: general  Patient location: PACU  Post pain: Pain level controlled  Post assessment: Patient's Cardiovascular Status Stable  Last Vitals:  Filed Vitals:   06/03/14 1415  BP:   Pulse: 87  Temp:   Resp: 20    Post vital signs: Reviewed and stable  Level of consciousness: sedated  Complications: No apparent anesthesia complications

## 2014-06-03 NOTE — Transfer of Care (Signed)
Immediate Anesthesia Transfer of Care Note  Patient: Corey [name]  Procedure(s) Performed: Procedure(s): ANTERIOR CERVICAL DECOMPRESSION FUSION, CERVICAL FIVE-SIX, CERVICAL SIX-SEVEN WITH INSTRUMENTATION AND ALLOGRAFT (N/A)  Patient Location: PACU  Anesthesia Type:General  Level of Consciousness: awake and alert   Airway & Oxygen Therapy: Patient Spontanous Breathing and Patient connected to nasal cannula oxygen  Post-op Assessment: Report given to PACU RN, Post -op Vital signs reviewed and stable and Patient moving all extremities X 4  Post vital signs: Reviewed and stable  Complications: No apparent anesthesia complications

## 2014-06-03 NOTE — Progress Notes (Signed)
Orthopedic Tech Progress Note Patient Details:  Corey [name] 09-08-1961 [id number]  Ortho Devices Type of Ortho Device: [place] cervical collar Ortho Device/Splint Location: neck Ortho Device/Splint Interventions: Corey CaldronOrdered   [name] 06/03/2014, 4:31 PM

## 2014-06-04 ENCOUNTER — Encounter (HOSPITAL_COMMUNITY): Payer: Self-pay | Admitting: Orthopedic Surgery

## 2014-06-04 MED FILL — Gelatin Absorbable Sponge Size 100: CUTANEOUS | Qty: 1 | Status: AC

## 2014-06-04 NOTE — Progress Notes (Signed)
UR Completed Bethani Brugger Graves-Bigelow, RN,BSN [phone]  

## 2014-06-04 NOTE — Op Note (Signed)
NAMEKHOEN, GENET NO.:  [id number]  MEDICAL RECORD NO.:  [id number]  LOCATION:  3C09C                        FACILITY:  [facility]  PHYSICIAN:  Estill Bamberg, MD      DATE OF BIRTH:  03/03/1961  DATE OF PROCEDURE:  06/03/2014                              OPERATIVE REPORT   PREOPERATIVE DIAGNOSES: 1. Cervical myelopathy. 2. Right-sided cervical radiculopathy.  POSTOPERATIVE DIAGNOSES: 1. Cervical myelopathy. 2. Right-sided cervical radiculopathy.  PROCEDURES: 1. Anterior cervical decompression and fusion, C5-6, C6-7. 2. Placement of anterior instrumentation, C5-C7. 3. Use of morselized allograft. 4. Insertion of interbody device x2 (tightened interbody spacers). 5. Intraoperative use of fluoroscopy.  SURGEON:  Estill Bamberg, MD.  ASSISTANTDorathy Daft McKenzie PA-C.  ANESTHESIA:  General endotracheal anesthesia.  COMPLICATIONS:  None.  DISPOSITION:  Stable.  ESTIMATED BLOOD LOSS:  Minimal.  INDICATIONS FOR PROCEDURE:  Briefly, Mr. Jun is a very pleasant 53- year-old male, who did initially present to me on March 08, 2014, status post a work injury that did occur on January 14, 2014.  The patient did have symptoms associated with cervical myelopathy, in addition to cervical radiculopathy.  Of note, the patient did have progressive bilateral hand tingling and numbness, and he did have progressive weakness in his grip.  An MRI did reveal spinal cord compression and neural foraminal compression at C5-6 and C6-7.  We therefore did discuss proceeding with procedure reflected above.  OPERATIVE DETAILS:  On June 03, 2014, the patient was brought to surgery and general endotracheal anesthesia was administered.  The patient was placed supine on the hospital bed.  The patient's arms were secured to the sides and all bony prominences were padded.  A time-out procedure was performed.  The neck was then prepped in the usual fashion.  A left transverse incision  was then made.  The platysma was incised.  A Clementeen Graham approach was used and the anterior spine was noted.  A lateral intraoperative fluoroscopic view did confirm the appropriate operative level.  I then placed a self-retaining Shadow-Line retractor.  I then placed Caspar pins into the C6 and C7 vertebral bodies and distraction was applied.  I then went forward with a standard C6-7 diskectomy.  The posterior longitudinal ligament was identified and entered using a nerve hook.  I then performed a central and bilateral neural foraminal decompression in the usual manner.  The endplates were then prepared and the appropriate size interbody spacer was packed with DBX mix and tamped into position in the usual fashion.  The lower Caspar pin was removed. The bone wax was placed in its place.  The Caspar pin was then placed into the C5 vertebral body and again distraction was applied, and again, a diskectomy and decompression was performed as previously noted.  The endplates were then prepared and the appropriate size interbody spacer was packed with DBX mix and tamped into position in the usual fashion. I did use intraoperative fluoroscopy while placing the hardware.  The appropriate-sized anterior cervical plate was placed over the anterior cervical spine.  The 14 mm variable angle screws were placed, 2 in each vertebral body from C5-C7 for a total of 6 vertebral body screws.  I was pleased with the press fit of the screws.  The CAM locking mechanism was utilized to secure the screws to the plate.  I was pleased with the radiographic appearance.  The wound was then copiously irrigated.  The platysma was then closed using 2-0 Vicryl.  The skin was then closed using 3-0 Monocryl.  Benzoin and Steri-Strips were applied followed by a sterile dressing.  All instrument counts were correct at the termination of the procedure.  Of note, Jason CoopKayla McKenzie was my assistant throughout the surgery and  did aid in retraction, suctioning and closure throughout the surgery.     Estill BambergMark Yaniyah Koors, MD     MD/MEDQ  D:  06/03/2014  T:  06/04/2014  Job:  [id number]

## 2014-06-04 NOTE — Progress Notes (Signed)
    Patient doing excellent. Resolved R arm pain, improved numbness, mild-moderate neck pain well controlled, expected moderate swallowing difficulties, denies hoarseness of voice, SOB. Pleased with progress and eager to proceed home   Physical Exam: Filed Vitals:   06/04/14 0757  BP: 148/83  Pulse: 85  Temp: 98.4 F (36.9 C)  Resp: 18    Dressing in place, CDI, Hard collar in place and worn appropriately. Neck soft and supple.   POD #1 s/p C5-7 ACDF for R radiculopathy and myelopathy  - Resolved R arm px, improved numbness - encourage ambulation - Percocet for pain, Valium for muscle spasms - likely d/c home today after breakfast  - F/U in office 2 weeks

## 2014-06-04 NOTE — Progress Notes (Signed)
Pt doing well. Pt and wife given D/C instructions with Rx's, verbal understanding of teaching was given. Pt's IV was removed prior to D/C. Pt D/C'd home via wheelchair with aspen collar and Philly collar @ 0920 per MD order. Pt is stable @ D/C and has no other needs at this time. Rema FendtAshley Aleah Ahlgrim, RN

## 2014-06-09 NOTE — Discharge Summary (Signed)
Patient ID: Corey [name] MRN: [id number] DOB/AGE: [birth year] 53 y.o.  Admit date: 06/03/2014 Discharge date: 06/04/2014  Admission Diagnoses:  Active Problems:   Myelopathy   Discharge Diagnoses:  Same  Past Medical History  Diagnosis Date  . Hypertension   . Sleep apnea     cpap 16 yrs  . Shortness of breath     occ-allergies  . History of kidney stones   . GERD (gastroesophageal reflux disease)     occ  . Arthritis     Surgeries: Procedure(s): ANTERIOR CERVICAL DECOMPRESSION FUSION, CERVICAL FIVE-SIX, CERVICAL SIX-SEVEN WITH INSTRUMENTATION AND ALLOGRAFT on 06/03/2014   Consultants:  None  Discharged Condition: Improved  Hospital Course: Corey [name] is an 53 y.o. male who was admitted 06/03/2014 for operative treatment of myeloradiculopathy for unremitting pain that affects sleep, daily activities, and work/hobbies. After pre-op clearance the patient was taken to the operating room on 06/03/2014 and underwent  Procedure(s): ANTERIOR CERVICAL DECOMPRESSION FUSION, CERVICAL FIVE-SIX, CERVICAL SIX-SEVEN WITH INSTRUMENTATION AND ALLOGRAFT.    Patient was given perioperative antibiotics:  Anti-infectives   Start     Dose/Rate Route Frequency Ordered Stop   06/03/14 1700  ceFAZolin (ANCEF) IVPB 1 g/50 mL premix     1 g 100 mL/hr over 30 Minutes Intravenous Every 8 hours 06/03/14 1611 06/04/14 0151   06/03/14 0600  ceFAZolin (ANCEF) IVPB 2 g/50 mL premix     2 g 100 mL/hr over 30 Minutes Intravenous On call to O.R. 06/02/14 1426 06/03/14 1030       Patient was given sequential compression devices, early ambulation to prevent DVT.  Patient benefited maximally from hospital stay and there were no complications.    Recent vital signs: BP 148/83  Pulse 85  Temp(Src) 98.4 F (36.9 C) (Oral)  Resp 18  SpO2 92%   Discharge Medications:     Medication List    STOP taking these medications       cyclobenzaprine 5 MG tablet  Commonly known as:  FLEXERIL     naproxen 500 MG tablet  Commonly known as:  NAPROSYN      TAKE these medications       albuterol 108 (90 BASE) MCG/ACT inhaler  Commonly known as:  PROVENTIL HFA;VENTOLIN HFA  Inhale 2 puffs into the lungs every 6 (six) hours as needed for wheezing or shortness of breath.     ALPRAZolam 1 MG tablet  Commonly known as:  XANAX  Take 0.5 mg by mouth at bedtime as needed for anxiety or sleep.     ANDROGEL PUMP 20.25 MG/ACT (1.62%) Gel  Generic drug:  Testosterone  Place 2 g onto the skin every morning.     buPROPion 300 MG 24 hr tablet  Commonly known as:  WELLBUTRIN XL  Take 300 mg by mouth at bedtime.     MUCINEX DM MAXIMUM STRENGTH PO  Take 1 tablet by mouth 2 (two) times daily.     valsartan 320 MG tablet  Commonly known as:  DIOVAN  Take 320 mg by mouth daily.        Diagnostic Studies: Dg Chest 2 View  06/03/2014   CLINICAL DATA:  Preoperative examination.  EXAM: CHEST  2 VIEW  COMPARISON:  None.  FINDINGS: The lungs are clear. Heart size is normal. No pneumothorax or pleural effusion. Thoracic spondylosis is noted.  IMPRESSION: No acute disease.   Electronically Signed   By: Drusilla Kanner M.D.   On: 06/03/2014 08:35   Dg  Cervical Spine 2-3 Views  06/03/2014   CLINICAL DATA:  Lateral cervical spine portable images during cervical fusion.  EXAM: DG C-ARM 61-120 MIN; CERVICAL SPINE - 2-3 VIEW  : FINDINGS:  Two portable lateral images of the cervical spine were submitted. First shows placement of a surgical probe through an anterior approach to have its tip at the axial level of the mid body of C 6. Follow-up image shows placement of an anterior fusion plate and fixation screws spanning C5-C7. Metal intervertebral cages are well positioned centrally in the C5-C6 and C6-C7 disc spaces. No evidence of an operative complication.  IMPRESSION: Lateral images during cervical fusion from C5-C7 as described.   Electronically Signed   By: Amie Portlandavid  Ormond M.D.   On: 06/03/2014 17:11   Dg  C-arm 1-60 Min  06/03/2014   CLINICAL DATA:  Lateral cervical spine portable images during cervical fusion.  EXAM: DG C-ARM 61-120 MIN; CERVICAL SPINE - 2-3 VIEW  : FINDINGS:  Two portable lateral images of the cervical spine were submitted. First shows placement of a surgical probe through an anterior approach to have its tip at the axial level of the mid body of C 6. Follow-up image shows placement of an anterior fusion plate and fixation screws spanning C5-C7. Metal intervertebral cages are well positioned centrally in the C5-C6 and C6-C7 disc spaces. No evidence of an operative complication.  IMPRESSION: Lateral images during cervical fusion from C5-C7 as described.   Electronically Signed   By: Amie Portlandavid  Ormond M.D.   On: 06/03/2014 17:11    Disposition: 01-Home or Self Care   POD #1 s/p C5-7 ACDF for R radiculopathy and myelopathy  - Resolved R arm px, improved numbness  - encourage ambulation  - Percocet for pain, Valium for muscle spasms  -Written scripts for pain signed and in chart -D/C instructions sheet printed and in chart -D/C today  -F/U in office 2 weeks   Signed: Georga BoraMCKENZIE, Kellianne Ek J 06/09/2014, 12:33 PM

## 2016-02-10 ENCOUNTER — Ambulatory Visit (INDEPENDENT_AMBULATORY_CARE_PROVIDER_SITE_OTHER): Payer: 59 | Admitting: Cardiology

## 2016-02-10 ENCOUNTER — Encounter: Payer: Self-pay | Admitting: Cardiology

## 2016-02-10 VITALS — BP 126/74 | Ht 65.0 in | Wt 261.1 lb

## 2016-02-10 DIAGNOSIS — R079 Chest pain, unspecified: Secondary | ICD-10-CM | POA: Diagnosis not present

## 2016-02-10 DIAGNOSIS — R0602 Shortness of breath: Secondary | ICD-10-CM | POA: Diagnosis not present

## 2016-02-10 DIAGNOSIS — I739 Peripheral vascular disease, unspecified: Secondary | ICD-10-CM

## 2016-02-10 DIAGNOSIS — I10 Essential (primary) hypertension: Secondary | ICD-10-CM | POA: Insufficient documentation

## 2016-02-10 DIAGNOSIS — K219 Gastro-esophageal reflux disease without esophagitis: Secondary | ICD-10-CM | POA: Insufficient documentation

## 2016-02-10 DIAGNOSIS — I517 Cardiomegaly: Secondary | ICD-10-CM | POA: Diagnosis not present

## 2016-02-10 DIAGNOSIS — N200 Calculus of kidney: Secondary | ICD-10-CM

## 2016-02-10 DIAGNOSIS — G4733 Obstructive sleep apnea (adult) (pediatric): Secondary | ICD-10-CM | POA: Insufficient documentation

## 2016-02-10 HISTORY — DX: Obstructive sleep apnea (adult) (pediatric): G47.33

## 2016-02-10 HISTORY — DX: Essential (primary) hypertension: I10

## 2016-02-10 HISTORY — DX: Calculus of kidney: N20.0

## 2016-02-10 NOTE — Progress Notes (Signed)
Cardiology Office Note   Date:  02/10/2016   ID:  Corey [name], DOB 13-Jul-1961, MRN [id number]  PCP:  Dema SeverinYORK,REGINA F, NP    Chief Complaint  Patient presents with  . Chest Pain  . Shortness of Breath      History of Present Illness: Corey [name] is a 55 y.o. male who presents for evaluation of abnormal chest xray.  [name].  He was initially evaluated by his PCP and found to have an abnormal chest xray with an enlarged heart.  He has a history of HTN and has a family history of CAD with CABG at age 55.  He has a remote history of tobacco use.  His last lipids showed a total chol of 168, LDL 108, HDL 45 and TAG 75. He does have problems with DOE but has gained significant weight since neck surgery.  He says the SOB was gradual as he gained weight.  He occasionally complains of chest tightness in his chest at the end of the day when he feels tired or gets angry that is midsternal with no radiation.  He denies any nausea or diaphoresis with the pain.  He denies any palpitations, dizziness or syncope.  He denies any PND or orthopnea but uses his CPAP nightly.  He also complains of cramps in his legs and fatigue that resolves with rest.     Past Medical History  Diagnosis Date  . Sleep apnea     cpap 16 yrs  . Shortness of breath     occ-allergies  . History of kidney stones   . GERD (gastroesophageal reflux disease)     occ  . Arthritis   . Benign essential HTN 02/10/2016    Past Surgical History  Procedure Laterality Date  . Urethral dilation      child  . Anterior cervical decomp/discectomy fusion N/A 06/03/2014    Procedure: ANTERIOR CERVICAL DECOMPRESSION FUSION, CERVICAL FIVE-SIX, CERVICAL SIX-SEVEN WITH INSTRUMENTATION AND ALLOGRAFT;  Surgeon: Emilee HeroMark Leonard Dumonski, MD;  Location: [facility] OR;  Service: Orthopedics;  Laterality: N/A;     Current Outpatient Prescriptions  Medication Sig Dispense Refill  . albuterol (PROVENTIL HFA;VENTOLIN HFA) 108 (90 BASE)  MCG/ACT inhaler Inhale 2 puffs into the lungs every 6 (six) hours as needed for wheezing or shortness of breath.    . ALPRAZolam (XANAX) 1 MG tablet Take 0.5 mg by mouth at bedtime as needed for anxiety or sleep.    [name]. buPROPion (WELLBUTRIN XL) 300 MG 24 hr tablet Take 300 mg by mouth at bedtime.    [name]. Dextromethorphan-Guaifenesin (MUCINEX DM MAXIMUM STRENGTH PO) Take 1 tablet by mouth 2 (two) times daily.    . Testosterone (ANDROGEL PUMP) 20.25 MG/ACT (1.62%) GEL Place 2 g onto the skin every morning.    . valsartan (DIOVAN) 320 MG tablet Take 320 mg by mouth daily.     No current facility-administered medications for this visit.    Allergies:   Review of patient's allergies indicates no known allergies.    Social History:  The patient  reports that he quit smoking about 3 years ago. His smoking use included Cigarettes. He has a 60 pack-year smoking history. He does not have any smokeless tobacco history on file. He reports that he does not drink alcohol or use illicit drugs.   Family History:  The patient's family history includes Arrhythmia in his mother; CAD (age of onset:  31) in his father; Heart failure in his mother.    ROS:  Please see the history of present illness.   Otherwise, review of systems are positive for none.   All other systems are reviewed and negative.    PHYSICAL EXAM: VS:  BP 126/74 mmHg  Ht  (1.651 m)  Wt 261 lb 1.9 oz (118.443 kg)  BMI 43.45 kg/m2 , BMI Body mass index is 43.45 kg/(m^2). GEN: Well nourished, well developed, in no acute distress HEENT: normal Neck: no JVD, carotid bruits, or masses Cardiac: RRR; no murmurs, rubs, or gallops,no edema  Respiratory:  clear to auscultation bilaterally, normal work of breathing GI: soft, nontender, nondistended, + BS MS: no deformity or atrophy Skin: warm and dry, no rash Neuro:  Strength and sensation are intact Psych: euthymic mood, full affect   EKG:  EKG is ordered today. The ekg ordered today  demonstrates NSR with no ST changes   Recent Labs: No results found for requested labs within last 365 days.    Lipid Panel No results found for: CHOL, TRIG, HDL, CHOLHDL, VLDL, LDLCALC, LDLDIRECT    Wt Readings from Last 3 Encounters:  02/10/16 261 lb 1.9 oz (118.443 kg)  05/24/14 241 lb 1.6 oz (109.362 kg)  01/14/14 245 lb (111.131 kg)       ASSESSMENT AND PLAN:  1.  Chest pain which is nonexertional and usually occurs at the end of the day when he is tired and also occurs when he is angry.  His cardiac risk factors include obesity, remote history of tobacco use, HTN.  EKG is nonischemic.  I will get a stress myoview to rule out ischemia.   2.  Cardiomegaly on chest xray - I suspect this is due to fat pad.  I will get a 2D echo to assess LVF and LVF size.  3.  DOE that seems to have come on gradually as he has gained weight.  - check 2D echo 4.  Claudication - with smoking history he may have PVD.  I will check LE arterial dopplers to assess further.  5.  HTN - well controlled. Continue Valsartan.   Current medicines are reviewed at length with the patient today.  The patient does not have concerns regarding medicines.  The following changes have been made:  no change  Labs/ tests ordered today: See above Assessment and Plan  Orders Placed This Encounter  Procedures  . Myocardial Perfusion Imaging  . EKG 12-Lead  . ECHOCARDIOGRAM COMPLETE     Disposition:   FU with me PRN pending results of studies  SignedQuintella Reichert, MD  02/10/2016 9:40 PM    [facility] [address] [place], [place], [place]  [address] Phone: [phone]; Fax: [phone]

## 2016-02-10 NOTE — Patient Instructions (Addendum)
Medication Instructions:  Your physician recommends that you continue on your current medications as directed. Please refer to the Current Medication list given to you today.   Labwork: None  Testing/Procedures: Your physician has requested that you have an echocardiogram. Echocardiography is a painless test that uses sound waves to create images of your heart. It provides your doctor with information about the size and shape of your heart and how well your heart's chambers and valves are working. This procedure takes approximately one hour. There are no restrictions for this procedure.   Your physician has requested that you have a lower extremity arterial duplex. During this test, ultrasound is used to evaluate arterial blood flow in the legs. Allow one hour for this exam. There are no restrictions or special instructions.   Dr. Mayford Knifeurner recommends you have a NUCLEAR STRESS TEST.  Follow-Up: Your physician recommends that you schedule a follow-up appointment AS NEEDED with Dr. Mayford Knifeurner pending study results.   Any Other Special Instructions Will Be Listed Below (If Applicable).     If you need a refill on your cardiac medications before your next appointment, please call your pharmacy.

## 2016-02-20 ENCOUNTER — Other Ambulatory Visit: Payer: Self-pay | Admitting: Cardiology

## 2016-02-20 DIAGNOSIS — I739 Peripheral vascular disease, unspecified: Secondary | ICD-10-CM

## 2016-03-07 ENCOUNTER — Telehealth (HOSPITAL_COMMUNITY): Payer: Self-pay

## 2016-03-07 NOTE — Telephone Encounter (Signed)
Encounter complete. 

## 2016-03-08 ENCOUNTER — Ambulatory Visit (HOSPITAL_COMMUNITY)
Admission: RE | Admit: 2016-03-08 | Discharge: 2016-03-08 | Disposition: A | Discharge status: Home / Self Care | Payer: 59 | Source: Ambulatory Visit | Attending: Cardiology | Admitting: Cardiology

## 2016-03-08 ENCOUNTER — Ambulatory Visit (HOSPITAL_BASED_OUTPATIENT_CLINIC_OR_DEPARTMENT_OTHER)
Admission: RE | Admit: 2016-03-08 | Discharge: 2016-03-08 | Disposition: A | Discharge status: Home / Self Care | Payer: 59 | Source: Ambulatory Visit | Attending: Cardiology | Admitting: Cardiology

## 2016-03-08 DIAGNOSIS — R0600 Dyspnea, unspecified: Secondary | ICD-10-CM | POA: Diagnosis present

## 2016-03-08 DIAGNOSIS — Z6841 Body Mass Index (BMI) 40.0 and over, adult: Secondary | ICD-10-CM | POA: Diagnosis not present

## 2016-03-08 DIAGNOSIS — Z8249 Family history of ischemic heart disease and other diseases of the circulatory system: Secondary | ICD-10-CM | POA: Insufficient documentation

## 2016-03-08 DIAGNOSIS — I739 Peripheral vascular disease, unspecified: Secondary | ICD-10-CM | POA: Diagnosis not present

## 2016-03-08 DIAGNOSIS — R938 Abnormal findings on diagnostic imaging of other specified body structures: Secondary | ICD-10-CM | POA: Diagnosis not present

## 2016-03-08 DIAGNOSIS — E669 Obesity, unspecified: Secondary | ICD-10-CM | POA: Insufficient documentation

## 2016-03-08 DIAGNOSIS — R0602 Shortness of breath: Secondary | ICD-10-CM | POA: Diagnosis not present

## 2016-03-08 DIAGNOSIS — K219 Gastro-esophageal reflux disease without esophagitis: Secondary | ICD-10-CM | POA: Insufficient documentation

## 2016-03-08 DIAGNOSIS — R079 Chest pain, unspecified: Secondary | ICD-10-CM

## 2016-03-08 DIAGNOSIS — R5383 Other fatigue: Secondary | ICD-10-CM | POA: Diagnosis not present

## 2016-03-08 DIAGNOSIS — Z87891 Personal history of nicotine dependence: Secondary | ICD-10-CM | POA: Insufficient documentation

## 2016-03-08 DIAGNOSIS — I119 Hypertensive heart disease without heart failure: Secondary | ICD-10-CM | POA: Diagnosis not present

## 2016-03-08 DIAGNOSIS — G4733 Obstructive sleep apnea (adult) (pediatric): Secondary | ICD-10-CM | POA: Insufficient documentation

## 2016-03-08 DIAGNOSIS — R0609 Other forms of dyspnea: Secondary | ICD-10-CM | POA: Diagnosis not present

## 2016-03-08 LAB — MYOCARDIAL PERFUSION IMAGING
Estimated workload: 8.3 METS
Exercise duration (min): 8 min
Exercise duration (sec): 10 s
LV dias vol: 109 mL (ref 62–150)
LV sys vol: 48 mL
MPHR: 166 {beats}/min
Peak HR: 142 {beats}/min
Percent HR: 85 %
RPE: 17
Rest HR: 67 {beats}/min
SDS: 0
SRS: 3
SSS: 3
TID: 1.06

## 2016-03-08 LAB — ECHOCARDIOGRAM COMPLETE
Height: 66 in
Weight: 4176 oz

## 2016-03-08 MED ORDER — TECHNETIUM TC 99M SESTAMIBI GENERIC - CARDIOLITE
28.1000 | Freq: Once | INTRAVENOUS | Status: AC | PRN
  Administered 2016-03-08: 28.1 via INTRAVENOUS

## 2016-03-08 MED ORDER — TECHNETIUM TC 99M SESTAMIBI GENERIC - CARDIOLITE
9.8000 | Freq: Once | INTRAVENOUS | Status: AC | PRN
  Administered 2016-03-08: 9.8 via INTRAVENOUS

## 2016-08-07 IMAGING — NM NM MISC PROCEDURE
6 series · 36 of 36 positions shown · non-contrast
Comparison: none

[Series 1: wbr_r-proj_st wbr rest · 6.40mm/px · 6 of 64 frames shown]
[frame 6/64]
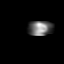
[frame 16/64]
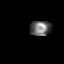
[frame 27/64]
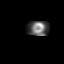
[frame 38/64]
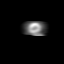
[frame 48/64]
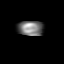
[frame 59/64]
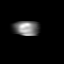

[Series 1: wbr rest · 6.40mm/px · 6 of 64 frames shown]
[frame 6/64]
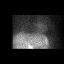
[frame 16/64]
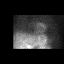
[frame 27/64]
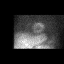
[frame 38/64]
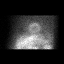
[frame 48/64]
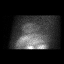
[frame 59/64]
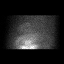

[Series 2: wbr_s-proj_st wbr stress-gsp · 6.40mm/px · 6 of 512 frames shown]
[frame 43/512]
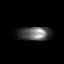
[frame 128/512]
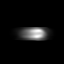
[frame 214/512]
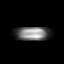
[frame 299/512]
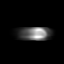
[frame 384/512]
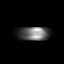
[frame 470/512]
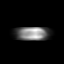

[Series 2: wbr stress-gsp · 6.40mm/px · 6 of 469 frames shown]
[frame 40/469  full-range]
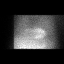
[frame 118/469  full-range]
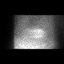
[frame 196/469  full-range]
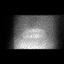
[frame 274/469  full-range]
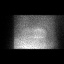
[frame 352/469  full-range]
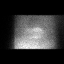
[frame 430/469  full-range]
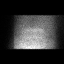

[Series 3: wbr stress-sum-em · 6.40mm/px · 6 of 64 frames shown]
[frame 6/64]
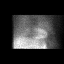
[frame 16/64]
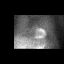
[frame 27/64]
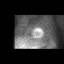
[frame 38/64]
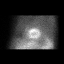
[frame 48/64]
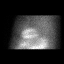
[frame 59/64]
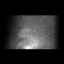

[Series 3: wbr_s-proj_st wbr stress-sum-em · 6.40mm/px · 6 of 64 frames shown]
[frame 6/64]
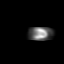
[frame 16/64]
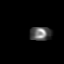
[frame 27/64]
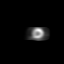
[frame 38/64]
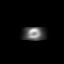
[frame 48/64]
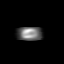
[frame 59/64]
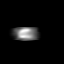

[36 of 36 positions shown; findings below may reference images not displayed]

Canned report from images found in remote index.

Refer to host system for actual result text.

## 2018-11-10 ENCOUNTER — Emergency Department (HOSPITAL_COMMUNITY): Payer: Worker's Compensation | Admitting: Certified Registered"

## 2018-11-10 ENCOUNTER — Emergency Department (HOSPITAL_BASED_OUTPATIENT_CLINIC_OR_DEPARTMENT_OTHER): Payer: Worker's Compensation

## 2018-11-10 ENCOUNTER — Encounter (HOSPITAL_COMMUNITY)
Admission: EM | Disposition: A | Discharge status: Home / Self Care | Payer: Self-pay | Source: Home / Self Care | Attending: Emergency Medicine

## 2018-11-10 ENCOUNTER — Other Ambulatory Visit: Payer: Self-pay

## 2018-11-10 ENCOUNTER — Encounter (HOSPITAL_BASED_OUTPATIENT_CLINIC_OR_DEPARTMENT_OTHER): Payer: Self-pay | Admitting: Emergency Medicine

## 2018-11-10 ENCOUNTER — Ambulatory Visit (HOSPITAL_BASED_OUTPATIENT_CLINIC_OR_DEPARTMENT_OTHER)
Admission: EM | Admit: 2018-11-10 | Discharge: 2018-11-10 | Disposition: A | Discharge status: Home / Self Care | Payer: Worker's Compensation | Attending: Emergency Medicine | Admitting: Emergency Medicine

## 2018-11-10 DIAGNOSIS — W231XXA Caught, crushed, jammed, or pinched between stationary objects, initial encounter: Secondary | ICD-10-CM | POA: Diagnosis not present

## 2018-11-10 DIAGNOSIS — Z79890 Hormone replacement therapy (postmenopausal): Secondary | ICD-10-CM | POA: Diagnosis not present

## 2018-11-10 DIAGNOSIS — S62635A Displaced fracture of distal phalanx of left ring finger, initial encounter for closed fracture: Secondary | ICD-10-CM | POA: Diagnosis not present

## 2018-11-10 DIAGNOSIS — S61215A Laceration without foreign body of left ring finger without damage to nail, initial encounter: Secondary | ICD-10-CM | POA: Insufficient documentation

## 2018-11-10 DIAGNOSIS — G4733 Obstructive sleep apnea (adult) (pediatric): Secondary | ICD-10-CM | POA: Diagnosis not present

## 2018-11-10 DIAGNOSIS — S61211A Laceration without foreign body of left index finger without damage to nail, initial encounter: Secondary | ICD-10-CM | POA: Diagnosis not present

## 2018-11-10 DIAGNOSIS — S68113A Complete traumatic metacarpophalangeal amputation of left middle finger, initial encounter: Secondary | ICD-10-CM | POA: Diagnosis present

## 2018-11-10 DIAGNOSIS — Z87891 Personal history of nicotine dependence: Secondary | ICD-10-CM | POA: Diagnosis not present

## 2018-11-10 DIAGNOSIS — I10 Essential (primary) hypertension: Secondary | ICD-10-CM | POA: Diagnosis not present

## 2018-11-10 DIAGNOSIS — Z23 Encounter for immunization: Secondary | ICD-10-CM | POA: Insufficient documentation

## 2018-11-10 DIAGNOSIS — S68119A Complete traumatic metacarpophalangeal amputation of unspecified finger, initial encounter: Secondary | ICD-10-CM

## 2018-11-10 DIAGNOSIS — S61213A Laceration without foreign body of left middle finger without damage to nail, initial encounter: Secondary | ICD-10-CM

## 2018-11-10 DIAGNOSIS — Z79899 Other long term (current) drug therapy: Secondary | ICD-10-CM | POA: Diagnosis not present

## 2018-11-10 DIAGNOSIS — E669 Obesity, unspecified: Secondary | ICD-10-CM | POA: Diagnosis not present

## 2018-11-10 DIAGNOSIS — Z6838 Body mass index (BMI) 38.0-38.9, adult: Secondary | ICD-10-CM | POA: Insufficient documentation

## 2018-11-10 DIAGNOSIS — Y9389 Activity, other specified: Secondary | ICD-10-CM | POA: Insufficient documentation

## 2018-11-10 HISTORY — DX: Obesity, unspecified: E66.9

## 2018-11-10 HISTORY — PX: I&D EXTREMITY: SHX5045

## 2018-11-10 LAB — CBC WITH DIFFERENTIAL/PLATELET
Abs Immature Granulocytes: 0.03 10*3/uL (ref 0.00–0.07)
Basophils Absolute: 0 10*3/uL (ref 0.0–0.1)
Basophils Relative: 0 %
Eosinophils Absolute: 0.1 10*3/uL (ref 0.0–0.5)
Eosinophils Relative: 1 %
HCT: 46 % (ref 39.0–52.0)
Hemoglobin: 14.8 g/dL (ref 13.0–17.0)
Immature Granulocytes: 0 %
Lymphocytes Relative: 24 %
Lymphs Abs: 2.6 10*3/uL (ref 0.7–4.0)
MCH: 29.7 pg (ref 26.0–34.0)
MCHC: 32.2 g/dL (ref 30.0–36.0)
MCV: 92.4 fL (ref 80.0–100.0)
Monocytes Absolute: 1 10*3/uL (ref 0.1–1.0)
Monocytes Relative: 9 %
Neutro Abs: 7.2 10*3/uL (ref 1.7–7.7)
Neutrophils Relative %: 66 %
Platelets: 315 10*3/uL (ref 150–400)
RBC: 4.98 MIL/uL (ref 4.22–5.81)
RDW: 13.1 % (ref 11.5–15.5)
WBC: 11 10*3/uL — ABNORMAL HIGH (ref 4.0–10.5)
nRBC: 0 % (ref 0.0–0.2)

## 2018-11-10 LAB — BASIC METABOLIC PANEL
Anion gap: 6 (ref 5–15)
BUN: 23 mg/dL — ABNORMAL HIGH (ref 6–20)
CO2: 26 mmol/L (ref 22–32)
Calcium: 9.6 mg/dL (ref 8.9–10.3)
Chloride: 106 mmol/L (ref 98–111)
Creatinine, Ser: 1.07 mg/dL (ref 0.61–1.24)
GFR calc Af Amer: >60 mL/min (ref >60)
GFR calc non Af Amer: >60 mL/min (ref >60)
Glucose, Bld: 122 mg/dL — ABNORMAL HIGH (ref 70–99)
Potassium: 4.3 mmol/L (ref 3.5–5.1)
Sodium: 138 mmol/L (ref 135–145)

## 2018-11-10 SURGERY — IRRIGATION AND DEBRIDEMENT EXTREMITY
Anesthesia: Monitor Anesthesia Care | Site: Finger | Laterality: Left

## 2018-11-10 MED ORDER — OXYCODONE HCL 5 MG PO TABS: 5.0000 mg | ORAL_TABLET | Freq: Four times a day (QID) | ORAL | 0 refills | Status: DC | PRN

## 2018-11-10 MED ORDER — TETANUS-DIPHTH-ACELL PERTUSSIS 5-2.5-18.5 LF-MCG/0.5 IM SUSP
0.5000 mL | Freq: Once | INTRAMUSCULAR | Status: AC
  Administered 2018-11-10: 0.5 mL via INTRAMUSCULAR
  Filled 2018-11-10: qty 0.5

## 2018-11-10 MED ORDER — FENTANYL CITRATE (PF) 100 MCG/2ML IJ SOLN
50.0000 ug | Freq: Once | INTRAMUSCULAR | Status: AC
  Administered 2018-11-10: 50 ug via INTRAVENOUS
  Filled 2018-11-10: qty 2

## 2018-11-10 MED ORDER — SODIUM CHLORIDE 0.9 % IV SOLN
INTRAVENOUS | Status: DC | PRN
  Administered 2018-11-10: 250 mL via INTRAVENOUS

## 2018-11-10 MED ORDER — LIDOCAINE HCL (PF) 1 % IJ SOLN
INTRAMUSCULAR | Status: DC | PRN
  Administered 2018-11-10: 9 mL

## 2018-11-10 MED ORDER — MIDAZOLAM HCL 5 MG/5ML IJ SOLN
INTRAMUSCULAR | Status: DC | PRN
  Administered 2018-11-10: 2 mg via INTRAVENOUS

## 2018-11-10 MED ORDER — LIDOCAINE 2% (20 MG/ML) 5 ML SYRINGE
INTRAMUSCULAR | Status: DC | PRN
  Administered 2018-11-10: 60 mg via INTRAVENOUS

## 2018-11-10 MED ORDER — BUPIVACAINE-EPINEPHRINE (PF) 0.25% -1:200000 IJ SOLN
INTRAMUSCULAR | Status: AC
  Filled 2018-11-10: qty 30

## 2018-11-10 MED ORDER — BUPIVACAINE-EPINEPHRINE (PF) 0.25% -1:200000 IJ SOLN
INTRAMUSCULAR | Status: DC | PRN
  Administered 2018-11-10: 9 mL via PERINEURAL

## 2018-11-10 MED ORDER — ACETAMINOPHEN 325 MG PO TABS: 650.0000 mg | ORAL_TABLET | Freq: Four times a day (QID) | ORAL | Status: DC

## 2018-11-10 MED ORDER — CEPHALEXIN 500 MG PO CAPS: 500.0000 mg | ORAL_CAPSULE | Freq: Four times a day (QID) | ORAL | 0 refills | Status: AC

## 2018-11-10 MED ORDER — ONDANSETRON HCL 4 MG/2ML IJ SOLN
INTRAMUSCULAR | Status: DC | PRN
  Administered 2018-11-10: 4 mg via INTRAVENOUS

## 2018-11-10 MED ORDER — LACTATED RINGERS IV SOLN
INTRAVENOUS | Status: DC
  Administered 2018-11-10: 19:00:00 via INTRAVENOUS

## 2018-11-10 MED ORDER — LIDOCAINE 2% (20 MG/ML) 5 ML SYRINGE
INTRAMUSCULAR | Status: AC
  Filled 2018-11-10: qty 5

## 2018-11-10 MED ORDER — PROPOFOL 500 MG/50ML IV EMUL
INTRAVENOUS | Status: DC | PRN
  Administered 2018-11-10: 75 ug/kg/min via INTRAVENOUS

## 2018-11-10 MED ORDER — LIDOCAINE HCL 1 % IJ SOLN
INTRAMUSCULAR | Status: AC
Start: 2018-11-10 — End: ?
  Filled 2018-11-10: qty 20

## 2018-11-10 MED ORDER — ONDANSETRON HCL 4 MG/2ML IJ SOLN
INTRAMUSCULAR | Status: AC
  Filled 2018-11-10: qty 2

## 2018-11-10 MED ORDER — FENTANYL CITRATE (PF) 250 MCG/5ML IJ SOLN
INTRAMUSCULAR | Status: AC
  Filled 2018-11-10: qty 5

## 2018-11-10 MED ORDER — 0.9 % SODIUM CHLORIDE (POUR BTL) OPTIME
TOPICAL | Status: DC | PRN
  Administered 2018-11-10: 1000 mL

## 2018-11-10 MED ORDER — CEFAZOLIN SODIUM 1 G IJ SOLR
INTRAMUSCULAR | Status: AC
  Filled 2018-11-10: qty 20

## 2018-11-10 MED ORDER — IBUPROFEN 200 MG PO TABS: 600.0000 mg | ORAL_TABLET | Freq: Four times a day (QID) | ORAL | Status: DC

## 2018-11-10 MED ORDER — MIDAZOLAM HCL 2 MG/2ML IJ SOLN
INTRAMUSCULAR | Status: AC
  Filled 2018-11-10: qty 2

## 2018-11-10 MED ORDER — PROPOFOL 10 MG/ML IV BOLUS
INTRAVENOUS | Status: DC | PRN
  Administered 2018-11-10: 30 mg via INTRAVENOUS

## 2018-11-10 MED ORDER — FENTANYL CITRATE (PF) 250 MCG/5ML IJ SOLN
INTRAMUSCULAR | Status: DC | PRN
  Administered 2018-11-10: 50 ug via INTRAVENOUS

## 2018-11-10 MED ORDER — CEFAZOLIN SODIUM-DEXTROSE 2-4 GM/100ML-% IV SOLN
2.0000 g | Freq: Once | INTRAVENOUS | Status: DC
  Filled 2018-11-10: qty 100

## 2018-11-10 MED ORDER — SODIUM CHLORIDE 0.9 % IV SOLN
2.0000 g | Freq: Once | INTRAVENOUS | Status: AC
  Administered 2018-11-10: 2 g via INTRAVENOUS
  Filled 2018-11-10: qty 20

## 2018-11-10 SURGICAL SUPPLY — 36 items
BANDAGE COBAN STERILE 2 (GAUZE/BANDAGES/DRESSINGS) IMPLANT
BLADE SURG 15 STRL LF DISP TIS (BLADE) ×1 IMPLANT
BLADE SURG 15 STRL SS (BLADE) ×3
BNDG COHESIVE 4X5 TAN STRL (GAUZE/BANDAGES/DRESSINGS) ×3 IMPLANT
BNDG GAUZE ELAST 4 BULKY (GAUZE/BANDAGES/DRESSINGS) ×4 IMPLANT
CHLORAPREP W/TINT 26ML (MISCELLANEOUS) ×1 IMPLANT
CORD BIPOLAR FORCEPS 12FT (ELECTRODE) ×3 IMPLANT
COVER WAND RF STERILE (DRAPES) ×1 IMPLANT
CUFF TOURNIQUET SINGLE 18IN (TOURNIQUET CUFF) ×2 IMPLANT
DRAPE SURG 17X23 STRL (DRAPES) ×3 IMPLANT
DRSG EMULSION OIL 3X3 NADH (GAUZE/BANDAGES/DRESSINGS) ×3 IMPLANT
GAUZE SPONGE 4X4 12PLY STRL (GAUZE/BANDAGES/DRESSINGS) ×3 IMPLANT
GLOVE BIO SURGEON STRL SZ7.5 (GLOVE) ×3 IMPLANT
GLOVE BIOGEL PI IND STRL 7.0 (GLOVE) ×1 IMPLANT
GLOVE BIOGEL PI IND STRL 8 (GLOVE) ×1 IMPLANT
GLOVE BIOGEL PI INDICATOR 7.0 (GLOVE) ×2
GLOVE BIOGEL PI INDICATOR 8 (GLOVE) ×2
GLOVE ECLIPSE 6.5 STRL STRAW (GLOVE) ×3 IMPLANT
GOWN STRL REUS W/ TWL LRG LVL3 (GOWN DISPOSABLE) ×2 IMPLANT
GOWN STRL REUS W/TWL LRG LVL3 (GOWN DISPOSABLE) ×6
GOWN STRL REUS W/TWL XL LVL3 (GOWN DISPOSABLE) ×3 IMPLANT
KIT BASIN OR (CUSTOM PROCEDURE TRAY) ×3 IMPLANT
NDL HYPO 25X1 1.5 SAFETY (NEEDLE) IMPLANT
NEEDLE HYPO 25X1 1.5 SAFETY (NEEDLE) ×3 IMPLANT
NS IRRIG 1000ML POUR BTL (IV SOLUTION) ×3 IMPLANT
PACK ORTHO EXTREMITY (CUSTOM PROCEDURE TRAY) ×3 IMPLANT
PADDING CAST ABS 4INX4YD NS (CAST SUPPLIES) ×2
PADDING CAST ABS COTTON 4X4 ST (CAST SUPPLIES) IMPLANT
RUBBERBAND STERILE (MISCELLANEOUS) IMPLANT
SET IRRIG Y TYPE TUR BLADDER L (SET/KITS/TRAYS/PACK) IMPLANT
STOCKINETTE 6  STRL (DRAPES) ×2
STOCKINETTE 6 STRL (DRAPES) ×1 IMPLANT
SUT VICRYL RAPIDE 4-0 (SUTURE) IMPLANT
SUT VICRYL RAPIDE 4/0 PS 2 (SUTURE) ×2 IMPLANT
SYR 10ML LL (SYRINGE) IMPLANT
UNDERPAD 30X30 (UNDERPADS AND DIAPERS) ×3 IMPLANT
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
## 2018-11-10 NOTE — ED Notes (Signed)
ED Provider at bedside. 
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
## 2018-11-10 NOTE — Discharge Instructions (Addendum)
Discharge Instructions    Move your fingers as much as possible, making a full fist and fully opening the fist. Elevate your hand to reduce pain & swelling of the digits.  Ice over the operative site may be helpful to reduce pain & swelling.  DO NOT USE HEAT. Leave the dressing in place until you return to our office.  You may shower, but keep the bandage clean & dry.  You may drive a car when you are off of prescription pain medications and can safely control your vehicle with both hands. Our office will call you to arrange follow-up   Please call [phone] during normal business hours or [phone] after hours for any problems. Including the following:  - excessive redness of the incisions - drainage for more than 4 days - fever of more than 101.5 F  *Please note that pain medications will not be refilled after hours or on weekends.
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
## 2018-11-10 NOTE — Anesthesia Postprocedure Evaluation (Signed)
Anesthesia Post Note  Patient: Corey [name]  Procedure(s) Performed: IRRIGATION AND DEBRIDEMENT EXTREMITY, REVISION AMPUTATION OF LEFT RING FINGER (Left Finger)     Patient location during evaluation: PACU Anesthesia Type: MAC Level of consciousness: awake and alert Pain management: pain level controlled Vital Signs Assessment: post-procedure vital signs reviewed and stable Respiratory status: spontaneous breathing, nonlabored ventilation, respiratory function stable and patient connected to nasal cannula oxygen Cardiovascular status: stable and blood pressure returned to baseline Postop Assessment: no apparent nausea or vomiting Anesthetic complications: no    Last Vitals:  Vitals:   11/10/18 2123 11/10/18 2126  BP: (!) 151/105 (!) 141/81  Pulse: 70 72  Resp: (!) 24 20  Temp:  36.5 C  SpO2: 91% 92%    Last Pain:  Vitals:   11/10/18 2108  TempSrc:   PainSc: 0-No pain                 Lavonna Lampron COKER
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
## 2018-11-10 NOTE — Transfer of Care (Signed)
Immediate Anesthesia Transfer of Care Note  Patient: Corey [name]  Procedure(s) Performed: IRRIGATION AND DEBRIDEMENT EXTREMITY, REVISION AMPUTATION OF LEFT RING FINGER (Left Finger)  Patient Location: PACU  Anesthesia Type:MAC  Level of Consciousness: awake, alert , oriented and patient cooperative  Airway & Oxygen Therapy: Patient Spontanous Breathing and Patient connected to nasal cannula oxygen  Post-op Assessment: Report given to RN, Post -op Vital signs reviewed and stable and Patient moving all extremities  Post vital signs: Reviewed and stable  Last Vitals:  Vitals Value Taken Time  BP 140/116 11/10/2018  9:08 PM  Temp    Pulse 75 11/10/2018  9:09 PM  Resp 18 11/10/2018  9:09 PM  SpO2 96 % 11/10/2018  9:09 PM  Vitals shown include unvalidated device data.  Last Pain:  Vitals:   11/10/18 1726  TempSrc:   PainSc: 10-Worst pain ever         Complications: No apparent anesthesia complications

## 2018-11-10 NOTE — Anesthesia Preprocedure Evaluation (Signed)
Anesthesia Evaluation  Patient identified by MRN, date of birth, ID band Patient awake    Reviewed: Allergy & Precautions, NPO status , Patient's Chart, lab work & pertinent test results  Airway Mallampati: III  TM Distance: >3 FB Neck ROM: Full    Dental  (+) Edentulous Upper, Edentulous Lower   Pulmonary former smoker,    breath sounds clear to auscultation       Cardiovascular hypertension,  Rhythm:Regular Rate:Normal     Neuro/Psych    GI/Hepatic   Endo/Other    Renal/GU      Musculoskeletal   Abdominal (+) + obese,   Peds  Hematology   Anesthesia Other Findings   Reproductive/Obstetrics                             Anesthesia Physical Anesthesia Plan  ASA: III  Anesthesia Plan: MAC   Post-op Pain Management:    Induction:   PONV Risk Score and Plan: Propofol infusion and Ondansetron  Airway Management Planned:   Additional Equipment:   Intra-op Plan:   Post-operative Plan:   Informed Consent: I have reviewed the patients History and Physical, chart, labs and discussed the procedure including the risks, benefits and alternatives for the proposed anesthesia with the patient or authorized representative who has indicated his/her understanding and acceptance.     Plan Discussed with: CRNA and Anesthesiologist  Anesthesia Plan Comments:         Anesthesia Quick Evaluation

## 2018-11-11 ENCOUNTER — Encounter (HOSPITAL_COMMUNITY): Payer: Self-pay | Admitting: Orthopedic Surgery

## 2019-06-02 ENCOUNTER — Ambulatory Visit (INDEPENDENT_AMBULATORY_CARE_PROVIDER_SITE_OTHER): Payer: Managed Care, Other (non HMO) | Admitting: Family Medicine

## 2019-06-02 ENCOUNTER — Encounter: Payer: Self-pay | Admitting: Family Medicine

## 2019-06-02 ENCOUNTER — Other Ambulatory Visit: Payer: Self-pay

## 2019-06-02 DIAGNOSIS — R079 Chest pain, unspecified: Secondary | ICD-10-CM | POA: Diagnosis not present

## 2019-06-02 NOTE — Progress Notes (Signed)
Chief Complaint  Patient presents with  . Chest tightness       New Patient Visit SUBJECTIVE: HPI: Corey [name] is an 58 y.o.male who is being seen for establishing care.  The patient was previously seen at [facility]. Due to COVID-19 pandemic, we are interacting via web portal for an electronic face-to-face visit. I verified patient's ID using 2 identifiers. Patient agreed to proceed with visit via this method. Patient is at work, I am at office. Patient and I are present for visit.   Duration of issue: 6 days Quality: Tightness/pressure Palliation: Rest Provocation: exertion (cycling or "overdoing it"); he is able to go about his daily job without issue Severity: 2/10 Radiation: none Duration of chest pain: several minutes Associated symptoms: sob Cardiac history: HTN Family heart history: Mom w A fib and CHF, dad w several MI's and bypass surgeries Smoker? No  No Known Allergies  Past Medical History:  Diagnosis Date  . Arthritis   . Benign essential HTN 02/10/2016  . GERD (gastroesophageal reflux disease)    occ  . History of kidney stones   . Obesity   . Shortness of breath    occ-allergies  . Sleep apnea    cpap 16 yrs   Past Surgical History:  Procedure Laterality Date  . ANTERIOR CERVICAL DECOMP/DISCECTOMY FUSION N/A 06/03/2014   Procedure: ANTERIOR CERVICAL DECOMPRESSION FUSION, CERVICAL FIVE-SIX, CERVICAL SIX-SEVEN WITH INSTRUMENTATION AND ALLOGRAFT;  Surgeon: Sinclair Ship, MD;  Location: [place];  Service: Orthopedics;  Laterality: N/A;  . I&D EXTREMITY Left 11/10/2018   Procedure: IRRIGATION AND DEBRIDEMENT EXTREMITY, REVISION AMPUTATION OF LEFT RING FINGER;  Surgeon: Milly Jakob, MD;  Location: [place];  Service: Orthopedics;  Laterality: Left;  . URETHRAL DILATION     child   Family History  Problem Relation Age of Onset  . Arrhythmia Mother        afib  . Heart failure Mother   . CAD Father 58       CABG with redo   No Known  Allergies  Current Outpatient Medications:  .  acetaminophen (TYLENOL) 325 MG tablet, Take 2 tablets (650 mg total) by mouth every 6 (six) hours., Disp: , Rfl:  .  ALPRAZolam (XANAX) 1 MG tablet, Take 0.5 mg by mouth at bedtime as needed for anxiety or sleep., Disp: , Rfl:  .  ibuprofen (ADVIL) 200 MG tablet, Take 3 tablets (600 mg total) by mouth every 6 (six) hours., Disp: , Rfl:  .  losartan (COZAAR) 100 MG tablet, Take 100 mg by mouth daily., Disp: , Rfl:  .  Omega-3 Fatty Acids (OMEGA-3 FISH OIL PO), Take 1 tablet by mouth daily., Disp: , Rfl:   ROS Cardiovascular: Denies current chest pain  Respiratory: Denies current dyspnea   OBJECTIVE: No conversational dyspnea Age appropriate judgment and insight Nml affect and mood  ASSESSMENT/PLAN: Exertional chest pain - Plan: Ambulatory referral to Cardiology; with hx, concerned for cardiac etiology with him. He is able to walk. Take it easy from heavy exertion causing tightness. If less exertion brings about s/s's, he is go to go ED.  Patient should return once he sees cards The patient voiced understanding and agreement to the plan.   [place], DO 06/02/19  11:00 AM

## 2019-06-04 ENCOUNTER — Other Ambulatory Visit: Payer: Self-pay

## 2019-06-04 ENCOUNTER — Telehealth (INDEPENDENT_AMBULATORY_CARE_PROVIDER_SITE_OTHER): Payer: Managed Care, Other (non HMO) | Admitting: Cardiology

## 2019-06-04 DIAGNOSIS — I10 Essential (primary) hypertension: Secondary | ICD-10-CM

## 2019-06-04 DIAGNOSIS — I209 Angina pectoris, unspecified: Secondary | ICD-10-CM

## 2019-06-04 DIAGNOSIS — K219 Gastro-esophageal reflux disease without esophagitis: Secondary | ICD-10-CM

## 2019-06-04 DIAGNOSIS — G4733 Obstructive sleep apnea (adult) (pediatric): Secondary | ICD-10-CM

## 2019-06-04 HISTORY — DX: Angina pectoris, unspecified: I20.9

## 2019-06-04 MED ORDER — NITROGLYCERIN 0.4 MG SL SUBL: 0.4000 mg | SUBLINGUAL_TABLET | SUBLINGUAL | 2 refills | Status: DC | PRN

## 2019-06-04 MED ORDER — ASPIRIN EC 81 MG PO TBEC: 81.0000 mg | DELAYED_RELEASE_TABLET | Freq: Every day | ORAL | 3 refills | Status: AC

## 2019-06-04 MED ORDER — METOPROLOL TARTRATE 25 MG PO TABS: 25.0000 mg | ORAL_TABLET | Freq: Two times a day (BID) | ORAL | 1 refills | Status: DC

## 2019-06-04 NOTE — Patient Instructions (Signed)
Medication Instructions:  Your physician has recommended you make the following change in your medication:   START: Aspirin 81 mg : Take 1 tab daily START: Metoprolol 25 mg : Take 1 tab twice daily  Nitroglycerin 0.4 mg sublingual (under your tongue) as needed for chest pain. If experiencing chest pain, stop what you are doing and sit down. Take 1 nitroglycerin and wait 5 minutes. If chest pain continues, take another nitroglycerin and wait 5 minutes. If chest pain does not subside, take 1 more nitroglycerin and dial 911. You make take a total of 3 nitroglycerin in a 15 minute time frame.   If you need a refill on your cardiac medications before your next appointment, please call your pharmacy.   Lab work: None If you have labs (blood work) drawn today and your tests are completely normal, you will receive your results only by: [name] MyChart Message (if you have MyChart) OR . A paper copy in the mail If you have any lab test that is abnormal or we need to change your treatment, we will call you to review the results.  Testing/Procedures: None  Follow-Up: At [facility], you and your health needs are our priority.  As part of our continuing mission to provide you with exceptional heart care, we have created designated Provider Care Teams.  These Care Teams include your primary Cardiologist (physician) and Advanced Practice Providers (APPs -  Physician Assistants and Nurse Practitioners) who all work together to provide you with the care you need, when you need it. You will need a follow up appointment in 2 weeks.  Any Other Special Instructions Will Be Listed Below (If Applicable).

## 2019-06-04 NOTE — Progress Notes (Signed)
Virtual Visit via Video Note   This visit type was conducted due to national recommendations for restrictions regarding the COVID-19 Pandemic (e.g. social distancing) in an effort to limit this patient's exposure and mitigate transmission in our community.  Due to his co-morbid illnesses, this patient is at least at moderate risk for complications without adequate follow up.  This format is felt to be most appropriate for this patient at this time.  All issues noted in this document were discussed and addressed.  A limited physical exam was performed with this format.  Please refer to the patient's chart for his consent to telehealth for [facility].  Evaluation Performed:  Follow-up visit  This visit type was conducted due to national recommendations for restrictions regarding the COVID-19 Pandemic (e.g. social distancing).  This format is felt to be most appropriate for this patient at this time.  All issues noted in this document were discussed and addressed.  No physical exam was performed (except for noted visual exam findings with Video Visits).  Please refer to the patient's chart (MyChart message for video visits and phone note for telephone visits) for the patient's consent to telehealth for [facility].  Date:  06/04/2019  ID: Corey PesterVernon T Farver, DOB [birth year], MRN [id number]   Patient Location: [address] [place] [id number]   Provider location:   [facility] [place] [facility]  PCP:  Dema SeverinYork, Regina F, NP  Cardiologist:  Gypsy Balsamobert Krasowski, MD     Chief Complaint: I have a chest pain  History of Present Illness:    Corey [name] is a 58 y.o. male  who presents via audio/video conferencing for a telehealth visit today.  With hypertension, hyperlipidemia, family history of premature coronary artery disease, remote history of smoking who was referred to me because of chest pain.  He said that few weeks ago he went to [place] with his family he was riding a bike  with his grandchildren and anytime he was trying to push himself a little harder he developed tightness squeezing in the chest shortness of breath was associated with this sensation.  He have to stop the sensation went away his activities of normal daily living does not bring this sensation up.  He has been back since then to [place] is able to walk and do normal activity without symptoms His risk factors include family history of premature coronary artery disease his father actually got bypasses done twice first time before age of 58.  He used to smoke but quit years ago, he does have hypertension, he does have dyslipidemia..   The patient does not have symptoms concerning for COVID-19 infection (fever, chills, cough, or new SHORTNESS OF BREATH).    Prior CV studies:   The following studies were reviewed today:       Past Medical History:  Diagnosis Date  . Arthritis   . Benign essential HTN 02/10/2016  . GERD (gastroesophageal reflux disease)    occ  . History of kidney stones   . Obesity   . Shortness of breath    occ-allergies  . Sleep apnea    cpap 16 yrs    Past Surgical History:  Procedure Laterality Date  . ANTERIOR CERVICAL DECOMP/DISCECTOMY FUSION N/A 06/03/2014   Procedure: ANTERIOR CERVICAL DECOMPRESSION FUSION, CERVICAL FIVE-SIX, CERVICAL SIX-SEVEN WITH INSTRUMENTATION AND ALLOGRAFT;  Surgeon: Emilee HeroMark Leonard Dumonski, MD;  [facility];  Service: Orthopedics;  Laterality: N/A;  . I&D EXTREMITY Left 11/10/2018   Procedure: IRRIGATION  AND DEBRIDEMENT EXTREMITY, REVISION AMPUTATION OF LEFT RING FINGER;  Surgeon: Milly Jakob, MD;  Location: [place];  Service: Orthopedics;  Laterality: Left;  . URETHRAL DILATION     child     Current Meds  Medication Sig  . acetaminophen (TYLENOL) 325 MG tablet Take 2 tablets (650 mg total) by mouth every 6 (six) hours.  . ALPRAZolam (XANAX) 1 MG tablet Take 0.5 mg by mouth at bedtime as needed for anxiety or sleep.  [name]  ibuprofen (ADVIL) 200 MG tablet Take 3 tablets (600 mg total) by mouth every 6 (six) hours.  [name] losartan (COZAAR) 100 MG tablet Take 100 mg by mouth daily.  . Omega-3 Fatty Acids (OMEGA-3 FISH OIL PO) Take 1 tablet by mouth daily.      Family History: The patient's family history includes Arrhythmia in his mother; CAD (age of onset: 55) in his father; Heart failure in his mother.   ROS:   Please see the history of present illness.     All other systems reviewed and are negative.   Labs/Other Tests and Data Reviewed:     Recent Labs: 11/10/2018: BUN 23; Creatinine, Ser 1.07; Hemoglobin 14.8; Platelets 315; Potassium 4.3; Sodium 138  Recent Lipid Panel No results found for: CHOL, TRIG, HDL, CHOLHDL, VLDL, LDLCALC, LDLDIRECT    Exam:    Vital Signs:  There were no vitals taken for this visit.    Wt Readings from Last 3 Encounters:  11/10/18 240 lb (108.9 kg)  03/08/16 261 lb (118.4 kg)  02/10/16 261 lb 1.9 oz (118.4 kg)     Well nourished, well developed in no acute distress. Alert oriented x3.  We talking over the video link.  He is asymptomatic at the time of my interview he is on the parking lot and talking to him while he sitting in his car.  Diagnosis for this visit:   1. Benign essential HTN   2. Angina pectoris ([place])   3. OSA (obstructive sleep apnea)   4. Gastroesophageal reflux disease without esophagitis      ASSESSMENT & PLAN:    1.  Angina pectoris really very worrisome story he is symptoms happen only with extreme exertion.  We talked about options in this situation and were talking about medical therapy versus stress testing versus cardiac catheterization.  He is not at the point of making decision what to do.  I explained to him all options and I told him and his situation cardiac catheterization is reasonable.  He wants to think about it.  In the meantime I will start him on appropriate medications.  We will start with aspirin I will give him prescription  for metoprolol 25 twice daily.  I will also give him prescription for nitroglycerin asked him to take it on as-needed basis.  I told him if nitroglycerin does not need to relieve the pain he need to go to the emergency room.  I will talk to him again about 2 weeks however I told him if he decide which way he wants to proceed in the meantime he can contact us will proceed whichever way he prefers. 2.  Obstructive sleep apnea use CPAP mask on a regular basis. 3.  Essential hypertension blood pressure well controlled continue present management. 4.  Gastroesophageal reflux disease.  Stable on appropriate medications.  COVID-19 Education: The signs and symptoms of COVID-19 were discussed with the patient and how to seek care for testing (follow up with PCP or arrange E-visit).  The  importance of social distancing was discussed today.  Patient Risk:   After full review of this patients clinical status, I feel that they are at least moderate risk at this time.  Time:   Today, I have spent 25 minutes with the patient with telehealth technology discussing pt health issues.  I spent 5 minutes reviewing her chart before the visit.  Visit was finished at 10:45 AM.    Medication Adjustments/Labs and Tests Ordered: Current medicines are reviewed at length with the patient today.  Concerns regarding medicines are outlined above.  No orders of the defined types were placed in this encounter.  Medication changes:  Meds ordered this encounter  Medications  . aspirin EC 81 MG tablet    Sig: Take 1 tablet (81 mg total) by mouth daily.    Dispense:  90 tablet    Refill:  3  . metoprolol tartrate (LOPRESSOR) 25 MG tablet    Sig: Take 1 tablet (25 mg total) by mouth 2 (two) times daily.    Dispense:  180 tablet    Refill:  1  . nitroGLYCERIN (NITROSTAT) 0.4 MG SL tablet    Sig: Place 1 tablet (0.4 mg total) under the tongue every 5 (five) minutes as needed for chest pain.    Dispense:  30 tablet     Refill:  2     Disposition: Follow-up in 2 weeks  Signed, Georgeanna Leaobert J. Krasowski, MD, [facility] 06/04/2019 11:23 AM    [facility]

## 2019-06-09 ENCOUNTER — Telehealth: Payer: Self-pay | Admitting: Cardiology

## 2019-06-09 DIAGNOSIS — I209 Angina pectoris, unspecified: Secondary | ICD-10-CM

## 2019-06-09 DIAGNOSIS — Z01812 Encounter for preprocedural laboratory examination: Secondary | ICD-10-CM

## 2019-06-09 NOTE — Telephone Encounter (Signed)
Patient has decided to schedule the heart cath that was suggested by Dr Raliegh Ip but he would like to have a call back before scheduling because he has a couple question

## 2019-06-10 NOTE — Telephone Encounter (Signed)
Patient seen by DR. Agustin Cree 06/04/2019. He recommended a cardiac cath at the time. The patient now wants to proceed. Ok to schedule?

## 2019-06-10 NOTE — Telephone Encounter (Signed)
Will need to discuss with Dr. Agustin Cree. Can route to his inbox or keep with follow up.

## 2019-06-10 NOTE — Telephone Encounter (Signed)
Telephone call to patient. Informed need to talk to Dr. Agustin Cree. Will route to his inbox and someone will contact him on Monday. Told to go to the ED if chest pain occurs without resolution with NTG .

## 2019-06-11 NOTE — Telephone Encounter (Signed)
Lets start working on getting him for cardiac cath next week and I can talk to him on Monday via virtualvisit

## 2019-06-12 ENCOUNTER — Ambulatory Visit: Payer: 59 | Admitting: Family Medicine

## 2019-06-12 DIAGNOSIS — Z0289 Encounter for other administrative examinations: Secondary | ICD-10-CM

## 2019-06-12 NOTE — Telephone Encounter (Signed)
Telephone call to patient. Informed of date and time of cardiac cath (06/19/2019 at 7:30 am) and to be at short stay at 5:30 am. Informed of date and time of Covid test which is 06/16/19 at 11:45 AM at the [facility]. He was told to quarantine after COvid test. Pt verbalized understanding.

## 2019-06-12 NOTE — Addendum Note (Signed)
Addended byShirlee More on: 06/12/2019 08:21 AM   Modules accepted: Miquel Dunn

## 2019-06-12 NOTE — Telephone Encounter (Signed)
Pt rescheduled for a virtual visit on Mon 06/15/19 and patient aware. BMP and CBC ordered and patient coming Mon to have drawn. COvid test scheduled for Tues 06/16/19 at 1145 and patient aware to be at the [facility] to have test. Cath scheduled for Friday 06/18/09 with Dr. Martin[place] for 7:30 case. Patient aware to be there by 5:30 AM at the [facility] at [facility] Orders will need to be placed and discussed with patient on Monday visit.

## 2019-06-15 ENCOUNTER — Other Ambulatory Visit: Payer: Self-pay

## 2019-06-15 ENCOUNTER — Encounter: Payer: Self-pay | Admitting: Cardiology

## 2019-06-15 ENCOUNTER — Ambulatory Visit (INDEPENDENT_AMBULATORY_CARE_PROVIDER_SITE_OTHER): Payer: Managed Care, Other (non HMO) | Admitting: Cardiology

## 2019-06-15 VITALS — BP 124/70 | HR 60 | Ht 66.0 in | Wt 245.0 lb

## 2019-06-15 DIAGNOSIS — G4733 Obstructive sleep apnea (adult) (pediatric): Secondary | ICD-10-CM

## 2019-06-15 DIAGNOSIS — I10 Essential (primary) hypertension: Secondary | ICD-10-CM | POA: Diagnosis not present

## 2019-06-15 DIAGNOSIS — I209 Angina pectoris, unspecified: Secondary | ICD-10-CM | POA: Diagnosis not present

## 2019-06-15 LAB — CBC
Hematocrit: 44.4 % (ref 37.5–51.0)
Hemoglobin: 14.8 g/dL (ref 13.0–17.7)
MCH: 30.5 pg (ref 26.6–33.0)
MCHC: 33.3 g/dL (ref 31.5–35.7)
MCV: 92 fL (ref 79–97)
Platelets: 278 10*3/uL (ref 150–450)
RBC: 4.85 x10E6/uL (ref 4.14–5.80)
RDW: 12.7 % (ref 11.6–15.4)
WBC: 6.7 10*3/uL (ref 3.4–10.8)

## 2019-06-15 LAB — BASIC METABOLIC PANEL
BUN/Creatinine Ratio: 17 (ref 9–20)
BUN: 15 mg/dL (ref 6–24)
CO2: 21 mmol/L (ref 20–29)
Calcium: 9.5 mg/dL (ref 8.7–10.2)
Chloride: 102 mmol/L (ref 96–106)
Creatinine, Ser: 0.89 mg/dL (ref 0.76–1.27)
GFR calc Af Amer: 110 mL/min/{1.73_m2} (ref >59)
GFR calc non Af Amer: 95 mL/min/{1.73_m2} (ref >59)
Glucose: 103 mg/dL — ABNORMAL HIGH (ref 65–99)
Potassium: 4.2 mmol/L (ref 3.5–5.2)
Sodium: 138 mmol/L (ref 134–144)

## 2019-06-15 NOTE — Patient Instructions (Addendum)
Medication Instructions:  Your physician recommends that you continue on your current medications as directed. Please refer to the Current Medication list given to you today.  If you need a refill on your cardiac medications before your next appointment, please call your pharmacy.   Lab work: Your physician recommends that you return for lab work today: cbc, bmp   If you have labs (blood work) drawn today and your tests are completely normal, you will receive your results only by:  [place] (if you have MyChart) OR  A paper copy in the mail If you have any lab test that is abnormal or we need to change your treatment, we will call you to review the results.  Testing/Procedures:  A chest x-ray takes a picture of the organs and structures inside the chest, including the heart, lungs, and blood vessels. This test can show several things, including, whether the heart is enlarges; whether fluid is building up in the lungs; and whether pacemaker / defibrillator leads are still in place.     Corey [name] [place] [address] Dept: [phone] Loc: [phone]  Corey [name]  06/15/2019  You are scheduled for a Cardiac Catheterization on Friday, July 24 with Dr. Peter Martinique.  1. Please arrive at the [facility] (Main Entrance A) at [facility]: [address] [place], [place] [address] at 5:30 AM (This time is two hours before your procedure to ensure your preparation). Free valet parking service is available.   Special note: Every effort is made to have your procedure done on time. Please understand that emergencies sometimes delay scheduled procedures.  2. Diet: Do not eat solid foods after midnight.  The patient may have clear liquids until 5am upon the day of the procedure.  3. Labs: You will have labs drawn today.   4. Medication instructions in preparation for your  procedure:   On the morning of your procedure, take your Aspirin and any morning medicines NOT listed above.  You may use sips of water.  5. Plan for one night stay--bring personal belongings. 6. Bring a current list of your medications and current insurance cards. 7. You MUST have a responsible person to drive you home. 8. Someone MUST be with you the first 24 hours after you arrive home or your discharge will be delayed. 9. Please wear clothes that are easy to get on and off and wear slip-on shoes.  Thank you for allowing [place] to care for you!   -- [facility]   Follow-Up: At [facility], you and your health needs are our priority.  As part of our continuing mission to provide you with exceptional heart care, we have created designated Provider Care Teams.  These Care Teams include your primary Cardiologist (physician) and Advanced Practice Providers (APPs -  Physician Assistants and Nurse Practitioners) who all work together to provide you with the care you need, when you need it. You will need a follow up appointment in 1 months.  Please call our office 2 months in advance to schedule this appointment.  You may see No primary care provider on file. or another member of our [employer] Provider Team in [place]: Shirlee More, MD  Jyl Heinz, MD  Any Other Special Instructions Will Be Listed Below (If Applicable).   Coronary Angiogram With Stent Coronary angiogram with stent placement is a procedure to widen or open a narrow  blood vessel of the heart (coronary artery). Arteries may become blocked by cholesterol buildup (plaques) in the lining of the wall. When a coronary artery becomes partially blocked, blood flow to that area decreases. This may lead to chest pain or a heart attack (myocardial infarction). A stent is a small piece of metal that looks like mesh or a spring. Stent placement may be done as treatment for a heart attack or right  after a coronary angiogram in which a blocked artery is found. Let your health care provider know about:  Any allergies you have.  All medicines you are taking, including vitamins, herbs, eye drops, creams, and over-the-counter medicines.  Any problems you or family members have had with anesthetic medicines.  Any blood disorders you have.  Any surgeries you have had.  Any medical conditions you have.  Whether you are pregnant or may be pregnant. What are the risks? Generally, this is a safe procedure. However, problems may occur, including:  Damage to the heart or its blood vessels.  A return of blockage.  Bleeding, infection, or bruising at the insertion site.  A collection of blood under the skin (hematoma) at the insertion site.  A blood clot in another part of the body.  Kidney injury.  Allergic reaction to the dye or contrast that is used.  Bleeding into the abdomen (retroperitoneal bleeding). What happens before the procedure? Staying hydrated Follow instructions from your health care provider about hydration, which may include:  Up to 2 hours before the procedure - you may continue to drink clear liquids, such as water, clear fruit juice, black coffee, and plain tea.  Eating and drinking restrictions Follow instructions from your health care provider about eating and drinking, which may include:  8 hours before the procedure - stop eating heavy meals or foods such as meat, fried foods, or fatty foods.  6 hours before the procedure - stop eating light meals or foods, such as toast or cereal.  2 hours before the procedure - stop drinking clear liquids. Ask your health care provider about:  Changing or stopping your regular medicines. This is especially important if you are taking diabetes medicines or blood thinners.  Taking medicines such as ibuprofen. These medicines can thin your blood. Do not take these medicines before your procedure if your health care  provider instructs you not to. Generally, aspirin is recommended before a procedure of passing a small, thin tube (catheter) through a blood vessel and into the heart (cardiac catheterization). What happens during the procedure?   An IV tube will be inserted into one of your veins.  You will be given one or more of the following: ? A medicine to help you relax (sedative). ? A medicine to numb the area where the catheter will be inserted into an artery (local anesthetic).  To reduce your risk of infection: ? Your health care team will wash or sanitize their hands. ? Your skin will be washed with soap. ? Hair may be removed from the area where the catheter will be inserted.  Using a guide wire, the catheter will be inserted into an artery. The location may be in your groin, in your wrist, or in the fold of your arm (near your elbow).  A type of X-ray (fluoroscopy) will be used to help guide the catheter to the opening of the arteries in the heart.  A dye will be injected into the catheter, and X-rays will be taken. The dye will help to show  where any narrowing or blockages are located in the arteries.  A tiny wire will be guided to the blocked spot, and a balloon will be inflated to make the artery wider.  The stent will be expanded and will crush the plaques into the wall of the vessel. The stent will hold the area open and improve the blood flow. Most stents have a drug coating to reduce the risk of the stent narrowing over time.  The artery may be made wider using a drill, laser, or other tools to remove plaques.  When the blood flow is better, the catheter will be removed. The lining of the artery will grow over the stent, which stays where it was placed. This procedure may vary among health care providers and hospitals. What happens after the procedure?  If the procedure is done through the leg, you will be kept in bed lying flat for about 6 hours. You will be instructed to not bend  and not cross your legs.  The insertion site will be checked frequently.  The pulse in your foot or wrist will be checked frequently.  You may have additional blood tests, X-rays, and a test that records the electrical activity of your heart (electrocardiogram, or ECG). This information is not intended to replace advice given to you by your health care provider. Make sure you discuss any questions you have with your health care provider. Document Released: 05/19/2003 Document Revised: 02/21/2018 Document Reviewed: 06/17/2016 Elsevier Patient Education  2020 Elsevier Inc.   Chest X-Ray A chest X-ray is a painless test that uses radiation to create images of the structures inside of your chest. Chest X-rays are used to look for many health conditions, including heart failure, pneumonia, tuberculosis, rib fractures, breathing disorders, and cancer. They may be used to diagnose chest pain, constant coughing, or trouble breathing. Tell a health care provider about:  Any allergies you have.  All medicines you are taking, including vitamins, herbs, eye drops, creams, and over-the-counter medicines.  Any surgeries you have had.  Any medical conditions you have.  Whether you are pregnant or may be pregnant. What are the risks? Getting a chest X-ray is a safe procedure. However, you will be exposed to a small amount of radiation. Being exposed to too much radiation over a lifetime can increase the risk of cancer. This risk is small, but it may occur if you have many X-rays throughout your life. What happens before the procedure?  You may be asked to remove glasses, jewelry, and any other metal objects.  You will be asked to undress from the waist up. You may be given a hospital gown to wear.  You may be asked to wear a protective lead apron to protect parts of your body from radiation. What happens during the procedure?   You will be asked to stand still as each picture is taken to get  the best possible images.  You will be asked to take a deep breath and hold your breath for a few seconds.  The X-ray machine will create a picture of your chest using a tiny burst of radiation. This is painless.  More pictures may be taken from other angles. Typically, one picture will be taken while you face the X-ray camera, and another picture will be taken from the side while you stand. If you cannot stand, you may be asked to lie down. The procedure may vary among health care providers and hospitals. What happens after the procedure?  The  X-ray(s) will be reviewed by your health care provider or an X-ray (radiology) specialist.  It is up to you to get your test results. Ask your health care provider, or the department that is doing the test, when your results will be ready.  Your health care provider will tell you if you need more tests or a follow-up exam. Keep all follow-up visits as told by your health care provider. This is important. Summary  A chest X-ray is a safe, painless test that is used to examine the inside of the chest, heart, and lungs.  You will need to undress from the waist up and remove jewelry and metal objects before the procedure.  You will be exposed to a small amount of radiation during the procedure.  The X-ray machine will take one or more pictures of your chest while you remain as still as possible.  Later, a health care provider or specialist will review the test results with you. This information is not intended to replace advice given to you by your health care provider. Make sure you discuss any questions you have with your health care provider. Document Released: 01/08/2017 Document Revised: 03/04/2019 Document Reviewed: 01/08/2017 Elsevier Patient Education  2020 [employer]

## 2019-06-15 NOTE — Addendum Note (Signed)
Addended by: Ashok Norris on: 06/15/2019 09:46 AM   Modules accepted: Orders

## 2019-06-15 NOTE — Progress Notes (Signed)
Cardiology Office Note:    Date:  06/15/2019   ID:  Corey [name], DOB 06/21/1961, MRN [id number]  PCP:  York, Regina F, NP  Cardiologist:  Robert Krasowski, MD    Referring MD: York, Regina F, NP   Chief Complaint  Patient presents with  . EKG  to be done  . Discuss getting cath  I want to have a cardiac catheterization  History of Present Illness:    Corey [name] is a 57 y.o. male I have seen him over the video link few weeks ago he presented with fairly typical exertional tightness in the chest.  We will talk about options for this situation I did offer him cardiac catheterization he said he would like to think about it.  He make up his mind that he would like to have a cardiac catheterization.  Overall he said he is doing well he is active and does not have any chest pain doing normal activity of daily living.  Previously had a chest pain when he was trying to push himself when he was riding a bike.  He described one episode since have seen him last time when he got upset and he started having tightness in the chest.  Did not have to take nitroglycerin.  His risk factors for coronary artery disease include family history of premature coronary artery disease, hypertension, history of smoking however he quit years ago.   Past Medical History:  Diagnosis Date  . Arthritis   . Benign essential HTN 02/10/2016  . GERD (gastroesophageal reflux disease)    occ  . History of kidney stones   . Obesity   . Shortness of breath    occ-allergies  . Sleep apnea    cpap 16 yrs    Past Surgical History:  Procedure Laterality Date  . ANTERIOR CERVICAL DECOMP/DISCECTOMY FUSION N/A 06/03/2014   Procedure: ANTERIOR CERVICAL DECOMPRESSION FUSION, CERVICAL FIVE-SIX, CERVICAL SIX-SEVEN WITH INSTRUMENTATION AND ALLOGRAFT;  Surgeon: Mark Leonard Dumonski, MD;  Location: [facility] OR;  Service: Orthopedics;  Laterality: N/A;  . I&D EXTREMITY Left 11/10/2018   Procedure: IRRIGATION AND DEBRIDEMENT  EXTREMITY, REVISION AMPUTATION OF LEFT RING FINGER;  Surgeon: Thompson, David, MD;  Location: [facility] OR;  Service: Orthopedics;  Laterality: Left;  . URETHRAL DILATION     child    Current Medications: Current Meds  Medication Sig  . acetaminophen (TYLENOL) 325 MG tablet Take 2 tablets (650 mg total) by mouth every 6 (six) hours.  . ALPRAZolam (XANAX) 1 MG tablet Take 0.5 mg by mouth at bedtime as needed for anxiety or sleep.  . aspirin EC 81 MG tablet Take 1 tablet (81 mg total) by mouth daily.  . losartan (COZAAR) 100 MG tablet Take 100 mg by mouth daily.  . nitroGLYCERIN (NITROSTAT) 0.4 MG SL tablet Place 1 tablet (0.4 mg total) under the tongue every 5 (five) minutes as needed for chest pain.  . Omega-3 Fatty Acids (OMEGA-3 FISH OIL PO) Take 1 tablet by mouth daily.     Allergies:   Patient has no known allergies.   Social History   Socioeconomic History  . Marital status: Married    Spouse name: Not on file  . Number of children: Not on file  . Years of education: Not on file  . Highest education level: Not on file  Occupational History  . Not on file  Social Needs  . Financial resource strain: Not on file  . Food insecurity    Worry: Not on   file    Inability: Not on file  . Transportation needs    Medical: Not on file    Non-medical: Not on file  Tobacco Use  . Smoking status: Former Smoker    Packs/day: 2.00    Years: 30.00    Pack years: 60.00    Types: Cigarettes    Quit date: 05/24/2012    Years since quitting: 7.0  . Smokeless tobacco: Never Used  Substance and Sexual Activity  . Alcohol use: No  . Drug use: No  . Sexual activity: Not on file  Lifestyle  . Physical activity    Days per week: Not on file    Minutes per session: Not on file  . Stress: Not on file  Relationships  . Social Herbalist on phone: Not on file    Gets together: Not on file    Attends religious service: Not on file    Active member of club or organization: Not on file     Attends meetings of clubs or organizations: Not on file    Relationship status: Not on file  Other Topics Concern  . Not on file  Social History Narrative  . Not on file     Family History: The patient's family history includes Arrhythmia in his mother; CAD (age of onset: 58) in his father; Heart failure in his mother. ROS:   Please see the history of present illness.    All 14 point review of systems negative except as described per history of present illness  EKGs/Labs/Other Studies Reviewed:      Recent Labs: 11/10/2018: BUN 23; Creatinine, Ser 1.07; Hemoglobin 14.8; Platelets 315; Potassium 4.3; Sodium 138  Recent Lipid Panel No results found for: CHOL, TRIG, HDL, CHOLHDL, VLDL, LDLCALC, LDLDIRECT  Physical Exam:    VS:  BP 124/70   Pulse 60   Ht 5\' 6"  (1.676 m)   Wt 245 lb (111.1 kg)   SpO2 98%   BMI 39.54 kg/m     Wt Readings from Last 3 Encounters:  06/15/19 245 lb (111.1 kg)  11/10/18 240 lb (108.9 kg)  03/08/16 261 lb (118.4 kg)     GEN:  Well nourished, well developed in no acute distress HEENT: Normal NECK: No JVD; No carotid bruits LYMPHATICS: No lymphadenopathy CARDIAC: RRR, no murmurs, no rubs, no gallops RESPIRATORY:  Clear to auscultation without rales, wheezing or rhonchi  ABDOMEN: Soft, non-tender, non-distended MUSCULOSKELETAL:  No edema; No deformity  SKIN: Warm and dry LOWER EXTREMITIES: no swelling NEUROLOGIC:  Alert and oriented x 3 PSYCHIATRIC:  Normal affect   ASSESSMENT:    1. Angina pectoris ([place])   2. Benign essential HTN   3. OSA (obstructive sleep apnea)    PLAN:    In order of problems listed above:  1. Angina pectoris.  Now only one episode since I seen him last time when he got upset.  Did not have to use any nitroglycerin.  We talked in length about what to do with the situation.  He agreed to have a cardiac catheterization he is scheduled to have it done on Friday.  I explained procedure to him including all risk  benefits as well as alternatives.  He agreed to proceed.  In the meantime I will start him also on statin I will start him on Crestor 10 mg daily he is reluctant to do it because he tells me he is cholesterol was always very good.  I told him if he  truly does have significant coronary artery disease whatever cholesterol he had checked before was probably noted good and have them he need to have cholesterol medication.  I told him to use nitroglycerin for chest pain go to the emergency room if pain is not relieved by nitroglycerin. 2. Essential hypertension blood pressure today 124/70 which is good I will continue present management. 3. Obstructive sleep apnea that being managed by internal medicine team   Medication Adjustments/Labs and Tests Ordered: Current medicines are reviewed at length with the patient today.  Concerns regarding medicines are outlined above.  No orders of the defined types were placed in this encounter.  Medication changes: No orders of the defined types were placed in this encounter.   Signed, Georgeanna Leaobert J. Krasowski, MD, [facility] HospitalFACC 06/15/2019 9:27 AM    [facility]

## 2019-06-15 NOTE — H&P (View-Only) (Signed)
Cardiology Office Note:    Date:  06/15/2019   ID:  Corey [name], DOB 1961/05/31, MRN [id number]  PCP:  Dema SeverinYork, Regina F, NP  Cardiologist:  Gypsy Balsamobert Journi Moffa, MD    Referring MD: Dema SeverinYork, Regina F, NP   Chief Complaint  Patient presents with  . EKG  to be done  . Discuss getting cath  I want to have a cardiac catheterization  History of Present Illness:    Corey [name] is a 58 y.o. male I have seen him over the video link few weeks ago he presented with fairly typical exertional tightness in the chest.  We will talk about options for this situation I did offer him cardiac catheterization he said he would like to think about it.  He make up his mind that he would like to have a cardiac catheterization.  Overall he said he is doing well he is active and does not have any chest pain doing normal activity of daily living.  Previously had a chest pain when he was trying to push himself when he was riding a bike.  He described one episode since have seen him last time when he got upset and he started having tightness in the chest.  Did not have to take nitroglycerin.  His risk factors for coronary artery disease include family history of premature coronary artery disease, hypertension, history of smoking however he quit years ago.   Past Medical History:  Diagnosis Date  . Arthritis   . Benign essential HTN 02/10/2016  . GERD (gastroesophageal reflux disease)    occ  . History of kidney stones   . Obesity   . Shortness of breath    occ-allergies  . Sleep apnea    cpap 16 yrs    Past Surgical History:  Procedure Laterality Date  . ANTERIOR CERVICAL DECOMP/DISCECTOMY FUSION N/A 06/03/2014   Procedure: ANTERIOR CERVICAL DECOMPRESSION FUSION, CERVICAL FIVE-SIX, CERVICAL SIX-SEVEN WITH INSTRUMENTATION AND ALLOGRAFT;  Surgeon: Emilee HeroMark Leonard Dumonski, MD;  Location: [facility] OR;  Service: Orthopedics;  Laterality: N/A;  . I&D EXTREMITY Left 11/10/2018   Procedure: IRRIGATION AND DEBRIDEMENT  EXTREMITY, REVISION AMPUTATION OF LEFT RING FINGER;  Surgeon: Mack Hookhompson, David, MD;  [facility];  Service: Orthopedics;  Laterality: Left;  . URETHRAL DILATION     child    Current Medications: Current Meds  Medication Sig  . acetaminophen (TYLENOL) 325 MG tablet Take 2 tablets (650 mg total) by mouth every 6 (six) hours.  . ALPRAZolam (XANAX) 1 MG tablet Take 0.5 mg by mouth at bedtime as needed for anxiety or sleep.  [name]. aspirin EC 81 MG tablet Take 1 tablet (81 mg total) by mouth daily.  [name]. losartan (COZAAR) 100 MG tablet Take 100 mg by mouth daily.  . nitroGLYCERIN (NITROSTAT) 0.4 MG SL tablet Place 1 tablet (0.4 mg total) under the tongue every 5 (five) minutes as needed for chest pain.  . Omega-3 Fatty Acids (OMEGA-3 FISH OIL PO) Take 1 tablet by mouth daily.     Allergies:   Patient has no known allergies.   Social History   Socioeconomic History  . Marital status: Married    Spouse name: Not on file  . Number of children: Not on file  . Years of education: Not on file  . Highest education level: Not on file  Occupational History  . Not on file  Social Needs  . Financial resource strain: Not on file  . Food insecurity    Worry: Not on  file    Inability: Not on file  . Transportation needs    Medical: Not on file    Non-medical: Not on file  Tobacco Use  . Smoking status: Former Smoker    Packs/day: 2.00    Years: 30.00    Pack years: 60.00    Types: Cigarettes    Quit date: 05/24/2012    Years since quitting: 7.0  . Smokeless tobacco: Never Used  Substance and Sexual Activity  . Alcohol use: No  . Drug use: No  . Sexual activity: Not on file  Lifestyle  . Physical activity    Days per week: Not on file    Minutes per session: Not on file  . Stress: Not on file  Relationships  . Social Herbalist on phone: Not on file    Gets together: Not on file    Attends religious service: Not on file    Active member of club or organization: Not on file     Attends meetings of clubs or organizations: Not on file    Relationship status: Not on file  Other Topics Concern  . Not on file  Social History Narrative  . Not on file     Family History: The patient's family history includes Arrhythmia in his mother; CAD (age of onset: 58) in his father; Heart failure in his mother. ROS:   Please see the history of present illness.    All 14 point review of systems negative except as described per history of present illness  EKGs/Labs/Other Studies Reviewed:      Recent Labs: 11/10/2018: BUN 23; Creatinine, Ser 1.07; Hemoglobin 14.8; Platelets 315; Potassium 4.3; Sodium 138  Recent Lipid Panel No results found for: CHOL, TRIG, HDL, CHOLHDL, VLDL, LDLCALC, LDLDIRECT  Physical Exam:    VS:  BP 124/70   Pulse 60   Ht 5\' 6"  (1.676 m)   Wt 245 lb (111.1 kg)   SpO2 98%   BMI 39.54 kg/m     Wt Readings from Last 3 Encounters:  06/15/19 245 lb (111.1 kg)  11/10/18 240 lb (108.9 kg)  03/08/16 261 lb (118.4 kg)     GEN:  Well nourished, well developed in no acute distress HEENT: Normal NECK: No JVD; No carotid bruits LYMPHATICS: No lymphadenopathy CARDIAC: RRR, no murmurs, no rubs, no gallops RESPIRATORY:  Clear to auscultation without rales, wheezing or rhonchi  ABDOMEN: Soft, non-tender, non-distended MUSCULOSKELETAL:  No edema; No deformity  SKIN: Warm and dry LOWER EXTREMITIES: no swelling NEUROLOGIC:  Alert and oriented x 3 PSYCHIATRIC:  Normal affect   ASSESSMENT:    1. Angina pectoris ([place])   2. Benign essential HTN   3. OSA (obstructive sleep apnea)    PLAN:    In order of problems listed above:  1. Angina pectoris.  Now only one episode since I seen him last time when he got upset.  Did not have to use any nitroglycerin.  We talked in length about what to do with the situation.  He agreed to have a cardiac catheterization he is scheduled to have it done on Friday.  I explained procedure to him including all risk  benefits as well as alternatives.  He agreed to proceed.  In the meantime I will start him also on statin I will start him on Crestor 10 mg daily he is reluctant to do it because he tells me he is cholesterol was always very good.  I told him if he  truly does have significant coronary artery disease whatever cholesterol he had checked before was probably noted good and have them he need to have cholesterol medication.  I told him to use nitroglycerin for chest pain go to the emergency room if pain is not relieved by nitroglycerin. 2. Essential hypertension blood pressure today 124/70 which is good I will continue present management. 3. Obstructive sleep apnea that being managed by internal medicine team   Medication Adjustments/Labs and Tests Ordered: Current medicines are reviewed at length with the patient today.  Concerns regarding medicines are outlined above.  No orders of the defined types were placed in this encounter.  Medication changes: No orders of the defined types were placed in this encounter.   Signed, Georgeanna Leaobert J. Kassidy Frankson, MD, [facility] 06/15/2019 9:27 AM    [facility]

## 2019-06-16 ENCOUNTER — Telehealth: Payer: Self-pay | Admitting: *Deleted

## 2019-06-16 ENCOUNTER — Other Ambulatory Visit (HOSPITAL_COMMUNITY)
Admission: RE | Admit: 2019-06-16 | Discharge: 2019-06-16 | Disposition: A | Discharge status: Home / Self Care | Payer: Managed Care, Other (non HMO) | Source: Ambulatory Visit | Attending: Cardiology | Admitting: Cardiology

## 2019-06-16 DIAGNOSIS — Z1159 Encounter for screening for other viral diseases: Secondary | ICD-10-CM | POA: Diagnosis present

## 2019-06-16 LAB — SARS CORONAVIRUS 2 (TAT 6-24 HRS): SARS Coronavirus 2: NEGATIVE

## 2019-06-16 NOTE — Telephone Encounter (Signed)
Labs are looking good per Dr. Raliegh Ip. Pt verbalized understanding.

## 2019-06-16 NOTE — Telephone Encounter (Signed)
-----   Message from Park Liter, MD sent at 06/16/2019 12:34 PM EDT ----- Labs are looking good

## 2019-06-18 ENCOUNTER — Telehealth: Payer: Self-pay | Admitting: *Deleted

## 2019-06-18 NOTE — Telephone Encounter (Addendum)
Pt contacted pre-catheterization scheduled at [facility] for: Friday June 19, 2019 7:30 AM Verified arrival time and place: [place] Entrance A at: 5:30   Covid-19 test date: 06/16/19  No solid food after midnight prior to cath, clear liquids until 5 AM day of procedure. Contrast allergy: no    AM meds can be  taken pre-cath with sip of water including: ASA 81 mg   Confirmed patient has responsible person to drive home post procedure and observe 24 hours after arriving home: yes  Due to Covid-19 pandemic, only one support person will be allowed with patient. Must be the same support person for that patient's entire stay. On arrival the support person will be required to wear a mask and will be screened, including having temporal temperature checked (below 100.4 to be cleared). They will be required to wait in the [facility] waiting room for the duration of the procedure. To limit exposure, MDs will continue to call support person after the procedure instead of speaking with them in consult room.   Patients are required to wear a mask when they enter the hospital.      COVID-19 Pre-Screening Questions:  . In the past 7 to 10 days have you had a cough,  shortness of breath, headache, congestion, fever (100 or greater) body aches, chills, sore throat, or sudden loss of taste or sense of smell? no . Have you been around anyone with known Covid 19? no . Have you been around anyone who is awaiting Covid 19 test results in the past 7 to 10 days? no . Have you been around anyone who has been exposed to Covid 19, or has mentioned symptoms of Covid 19 within the past 7 to 10 days? no  I reviewed procedure instructions/mask/visitor/Covid-19 screening questions with patient,he verbalized understanding, thanked me for call.  Pt states there has been an "outbreak"  of Covid-19 at his work but he has not been allowed in the building and has not been in physical contact with anyone  at his work.

## 2019-06-19 ENCOUNTER — Other Ambulatory Visit: Payer: Self-pay

## 2019-06-19 ENCOUNTER — Encounter (HOSPITAL_COMMUNITY)
Admission: RE | Disposition: A | Discharge status: Home / Self Care | Payer: Managed Care, Other (non HMO) | Source: Home / Self Care | Attending: Cardiology

## 2019-06-19 ENCOUNTER — Ambulatory Visit (HOSPITAL_COMMUNITY)
Admission: RE | Admit: 2019-06-19 | Discharge: 2019-06-19 | Disposition: A | Discharge status: Home / Self Care | Payer: Managed Care, Other (non HMO) | Attending: Cardiology | Admitting: Cardiology

## 2019-06-19 DIAGNOSIS — Z79899 Other long term (current) drug therapy: Secondary | ICD-10-CM | POA: Insufficient documentation

## 2019-06-19 DIAGNOSIS — I25119 Atherosclerotic heart disease of native coronary artery with unspecified angina pectoris: Secondary | ICD-10-CM | POA: Diagnosis not present

## 2019-06-19 DIAGNOSIS — Z8249 Family history of ischemic heart disease and other diseases of the circulatory system: Secondary | ICD-10-CM | POA: Insufficient documentation

## 2019-06-19 DIAGNOSIS — M1990 Unspecified osteoarthritis, unspecified site: Secondary | ICD-10-CM | POA: Insufficient documentation

## 2019-06-19 DIAGNOSIS — Z87891 Personal history of nicotine dependence: Secondary | ICD-10-CM | POA: Insufficient documentation

## 2019-06-19 DIAGNOSIS — E669 Obesity, unspecified: Secondary | ICD-10-CM | POA: Diagnosis not present

## 2019-06-19 DIAGNOSIS — K219 Gastro-esophageal reflux disease without esophagitis: Secondary | ICD-10-CM | POA: Insufficient documentation

## 2019-06-19 DIAGNOSIS — Z955 Presence of coronary angioplasty implant and graft: Secondary | ICD-10-CM | POA: Diagnosis not present

## 2019-06-19 DIAGNOSIS — Z6839 Body mass index (BMI) 39.0-39.9, adult: Secondary | ICD-10-CM | POA: Diagnosis not present

## 2019-06-19 DIAGNOSIS — Z7982 Long term (current) use of aspirin: Secondary | ICD-10-CM | POA: Insufficient documentation

## 2019-06-19 DIAGNOSIS — G4733 Obstructive sleep apnea (adult) (pediatric): Secondary | ICD-10-CM | POA: Diagnosis not present

## 2019-06-19 DIAGNOSIS — I10 Essential (primary) hypertension: Secondary | ICD-10-CM | POA: Diagnosis present

## 2019-06-19 DIAGNOSIS — I209 Angina pectoris, unspecified: Secondary | ICD-10-CM | POA: Diagnosis present

## 2019-06-19 DIAGNOSIS — E785 Hyperlipidemia, unspecified: Secondary | ICD-10-CM | POA: Insufficient documentation

## 2019-06-19 HISTORY — PX: CORONARY STENT INTERVENTION: CATH118234

## 2019-06-19 HISTORY — PX: LEFT HEART CATH AND CORONARY ANGIOGRAPHY: CATH118249

## 2019-06-19 LAB — POCT ACTIVATED CLOTTING TIME
Activated Clotting Time: 235 seconds
Activated Clotting Time: 505 seconds

## 2019-06-19 SURGERY — LEFT HEART CATH AND CORONARY ANGIOGRAPHY
Anesthesia: LOCAL

## 2019-06-19 MED ORDER — MIDAZOLAM HCL 2 MG/2ML IJ SOLN
INTRAMUSCULAR | Status: DC | PRN
  Administered 2019-06-19 (×2): 1 mg via INTRAVENOUS

## 2019-06-19 MED ORDER — SODIUM CHLORIDE 0.9 % WEIGHT BASED INFUSION: 1.0000 mL/kg/h | INTRAVENOUS | Status: DC

## 2019-06-19 MED ORDER — SODIUM CHLORIDE 0.9 % IV SOLN: 250.0000 mL | INTRAVENOUS | Status: DC | PRN

## 2019-06-19 MED ORDER — SODIUM CHLORIDE 0.9% FLUSH: 3.0000 mL | INTRAVENOUS | Status: DC | PRN

## 2019-06-19 MED ORDER — HEPARIN SODIUM (PORCINE) 1000 UNIT/ML IJ SOLN
INTRAMUSCULAR | Status: AC
  Filled 2019-06-19: qty 2

## 2019-06-19 MED ORDER — LIDOCAINE HCL (PF) 1 % IJ SOLN
INTRAMUSCULAR | Status: DC | PRN
  Administered 2019-06-19: 2 mL

## 2019-06-19 MED ORDER — LIDOCAINE HCL (PF) 1 % IJ SOLN
INTRAMUSCULAR | Status: AC
  Filled 2019-06-19: qty 30

## 2019-06-19 MED ORDER — HEPARIN (PORCINE) IN NACL 1000-0.9 UT/500ML-% IV SOLN
INTRAVENOUS | Status: AC
  Filled 2019-06-19: qty 1500

## 2019-06-19 MED ORDER — TICAGRELOR 90 MG PO TABS: 90.0000 mg | ORAL_TABLET | Freq: Two times a day (BID) | ORAL | Status: DC

## 2019-06-19 MED ORDER — SODIUM CHLORIDE 0.9 % WEIGHT BASED INFUSION
3.0000 mL/kg/h | INTRAVENOUS | Status: AC
  Administered 2019-06-19: 3 mL/kg/h via INTRAVENOUS

## 2019-06-19 MED ORDER — TICAGRELOR 90 MG PO TABS
ORAL_TABLET | ORAL | Status: DC | PRN
  Administered 2019-06-19: 180 mg via ORAL

## 2019-06-19 MED ORDER — HEPARIN (PORCINE) IN NACL 1000-0.9 UT/500ML-% IV SOLN
INTRAVENOUS | Status: DC | PRN
  Administered 2019-06-19 (×3): 500 mL

## 2019-06-19 MED ORDER — IOHEXOL 350 MG/ML SOLN
INTRAVENOUS | Status: DC | PRN
  Administered 2019-06-19: 150 mL via INTRAVENOUS

## 2019-06-19 MED ORDER — ATORVASTATIN CALCIUM 80 MG PO TABS: 80.0000 mg | ORAL_TABLET | Freq: Every day | ORAL | 1 refills | Status: DC

## 2019-06-19 MED ORDER — TICAGRELOR 90 MG PO TABS: 90.0000 mg | ORAL_TABLET | Freq: Two times a day (BID) | ORAL | 2 refills | Status: DC

## 2019-06-19 MED ORDER — TICAGRELOR 90 MG PO TABS
ORAL_TABLET | ORAL | Status: AC
  Filled 2019-06-19: qty 2

## 2019-06-19 MED ORDER — FENTANYL CITRATE (PF) 100 MCG/2ML IJ SOLN
INTRAMUSCULAR | Status: DC | PRN
  Administered 2019-06-19 (×3): 25 ug via INTRAVENOUS

## 2019-06-19 MED ORDER — ASPIRIN 81 MG PO CHEW: 81.0000 mg | CHEWABLE_TABLET | ORAL | Status: DC

## 2019-06-19 MED ORDER — SODIUM CHLORIDE 0.9% FLUSH: 3.0000 mL | Freq: Two times a day (BID) | INTRAVENOUS | Status: DC

## 2019-06-19 MED ORDER — MIDAZOLAM HCL 2 MG/2ML IJ SOLN
INTRAMUSCULAR | Status: AC
  Filled 2019-06-19: qty 2

## 2019-06-19 MED ORDER — VERAPAMIL HCL 2.5 MG/ML IV SOLN
INTRAVENOUS | Status: AC
  Filled 2019-06-19: qty 2

## 2019-06-19 MED ORDER — ANGIOPLASTY BOOK
Status: AC
  Filled 2019-06-19: qty 1

## 2019-06-19 MED ORDER — HEPARIN SODIUM (PORCINE) 1000 UNIT/ML IJ SOLN
INTRAMUSCULAR | Status: DC | PRN
  Administered 2019-06-19: 5500 [IU] via INTRAVENOUS
  Administered 2019-06-19: 3000 [IU] via INTRAVENOUS
  Administered 2019-06-19: 5500 [IU] via INTRAVENOUS

## 2019-06-19 MED ORDER — NITROGLYCERIN 1 MG/10 ML FOR IR/CATH LAB
INTRA_ARTERIAL | Status: DC | PRN
  Administered 2019-06-19: 200 ug via INTRACORONARY

## 2019-06-19 MED ORDER — NITROGLYCERIN 1 MG/10 ML FOR IR/CATH LAB
INTRA_ARTERIAL | Status: AC
  Filled 2019-06-19: qty 10

## 2019-06-19 MED ORDER — FENTANYL CITRATE (PF) 100 MCG/2ML IJ SOLN
INTRAMUSCULAR | Status: AC
  Filled 2019-06-19: qty 2

## 2019-06-19 MED ORDER — VERAPAMIL HCL 2.5 MG/ML IV SOLN
INTRAVENOUS | Status: DC | PRN
  Administered 2019-06-19: 10 mL via INTRA_ARTERIAL

## 2019-06-19 MED FILL — BRILINTA 90 MG TABLET: 90 | 30 days supply | Qty: 60 | Fill #0

## 2019-06-19 SURGICAL SUPPLY — 22 items
BALLN SAPPHIRE 2.5X15 (BALLOONS) ×2
BALLN SAPPHIRE ~~LOC~~ 3.0X18 (BALLOONS) ×1 IMPLANT
BALLOON SAPPHIRE 2.5X15 (BALLOONS) IMPLANT
BAND CMPR LRG ZPHR (HEMOSTASIS) ×1
BAND ZEPHYR COMPRESS 30 LONG (HEMOSTASIS) ×1 IMPLANT
CATH 5FR JL3.5 JR4 ANG PIG MP (CATHETERS) ×1 IMPLANT
CATH LAUNCHER 6FR EBU3.5 (CATHETERS) ×1 IMPLANT
COVER DOME SNAP 22 D (MISCELLANEOUS) ×1 IMPLANT
GUIDEWIRE INQWIRE 1.5J.035X260 (WIRE) IMPLANT
INQWIRE 1.5J .035X260CM (WIRE) ×2
KIT ENCORE 26 ADVANTAGE (KITS) ×1 IMPLANT
KIT HEART LEFT (KITS) ×2 IMPLANT
NDL PERC 21GX4CM (NEEDLE) IMPLANT
NEEDLE PERC 21GX4CM (NEEDLE) ×2 IMPLANT
PACK CARDIAC CATHETERIZATION (CUSTOM PROCEDURE TRAY) ×2 IMPLANT
SHEATH RAIN RADIAL 21G 6FR (SHEATH) ×1 IMPLANT
STENT RESOLUTE ONYX 3.0X26 (Permanent Stent) ×1 IMPLANT
SYR MEDRAD MARK 7 150ML (SYRINGE) ×2 IMPLANT
TRANSDUCER W/STOPCOCK (MISCELLANEOUS) ×2 IMPLANT
TUBING CIL FLEX 10 FLL-RA (TUBING) ×2 IMPLANT
WIRE ASAHI PROWATER 180CM (WIRE) ×1 IMPLANT
WIRE MICROINTRODUCER 60CM (WIRE) ×1 IMPLANT

## 2019-06-19 NOTE — Discharge Instructions (Signed)
Radial Site Care  This sheet gives you information about how to care for yourself after your procedure. Your health care provider may also give you more specific instructions. If you have problems or questions, contact your health care provider. What can I expect after the procedure? After the procedure, it is common to have:  Bruising and tenderness at the catheter insertion area. Follow these instructions at home: Medicines  Take over-the-counter and prescription medicines only as told by your health care provider. Insertion site care  Follow instructions from your health care provider about how to take care of your insertion site. Make sure you: ? Wash your hands with soap and water before you change your bandage (dressing). If soap and water are not available, use hand sanitizer. ? Change your dressing as told by your health care provider. ? Leave stitches (sutures), skin glue, or adhesive strips in place. These skin closures may need to stay in place for 2 weeks or longer. If adhesive strip edges start to loosen and curl up, you may trim the loose edges. Do not remove adhesive strips completely unless your health care provider tells you to do that.  Check your insertion site every day for signs of infection. Check for: ? Redness, swelling, or pain. ? Fluid or blood. ? Pus or a bad smell. ? Warmth.  Do not take baths, swim, or use a hot tub until your health care provider approves.  You may shower 24-48 hours after the procedure, or as directed by your health care provider. ? Remove the dressing and gently wash the site with plain soap and water. ? Pat the area dry with a clean towel. ? Do not rub the site. That could cause bleeding.  Do not apply powder or lotion to the site. Activity   For 24 hours after the procedure, or as directed by your health care provider: ? Do not flex or bend the affected arm. ? Do not push or pull heavy objects with the affected arm. ? Do not  drive yourself home from the hospital or clinic. You may drive 24 hours after the procedure unless your health care provider tells you not to. ? Do not operate machinery or power tools.  Do not lift anything that is heavier than 5 lb (4.5 kg) for one week.  Ask your health care provider when it is okay to: ? Return to work or school. ? Resume usual physical activities or sports. ? Resume sexual activity. General instructions  If the catheter site starts to bleed, raise your arm and put firm pressure on the site. If the bleeding does not stop, get help right away. This is a medical emergency.  If you went home on the same day as your procedure, a responsible adult should be with you for the first 24 hours after you arrive home.  Keep all follow-up visits as told by your health care provider. This is important. Contact a health care provider if:  You have a fever.  You have redness, swelling, or yellow drainage around your insertion site. Get help right away if:  You have unusual pain at the radial site.  The catheter insertion area swells very fast.  The insertion area is bleeding, and the bleeding does not stop when you hold steady pressure on the area.  Your arm or hand becomes pale, cool, tingly, or numb. These symptoms may represent a serious problem that is an emergency. Do not wait to see if the symptoms will go  away. Get medical help right away. Call your local emergency services (911 in the [place] Do not drive yourself to the hospital. Summary  After the procedure, it is common to have bruising and tenderness at the site.  Follow instructions from your health care provider about how to take care of your radial site wound. Check the wound every day for signs of infection.  Do not lift anything that is heavier than 10 lb (4.5 kg), or the limit that you are told, until your health care provider says that it is safe. This information is not intended to replace advice given to  you by your health care provider. Make sure you discuss any questions you have with your health care provider. Document Released: 12/15/2010 Document Revised: 12/18/2017 Document Reviewed: 12/18/2017 Elsevier Patient Education  2020 [employer]

## 2019-06-19 NOTE — Progress Notes (Signed)
Cardiology Nurse Practitioner in to see pt.

## 2019-06-19 NOTE — Interval H&P Note (Signed)
History and Physical Interval Note:  06/19/2019 9:04 AM  Corey [name]  has presented today for surgery, with the diagnosis of Angina.  The various methods of treatment have been discussed with the patient and family. After consideration of risks, benefits and other options for treatment, the patient has consented to  Procedure(s): LEFT HEART CATH AND CORONARY ANGIOGRAPHY (N/A) as a surgical intervention.  The patient's history has been reviewed, patient examined, no change in status, stable for surgery.  I have reviewed the patient's chart and labs.  Questions were answered to the patient's satisfaction.   Cath Lab Visit (complete for each Cath Lab visit)  Clinical Evaluation Leading to the Procedure:   ACS: No.  Non-ACS:    Anginal Classification: CCS II  Anti-ischemic medical therapy: No Therapy  Non-Invasive Test Results: No non-invasive testing performed  Prior CABG: No previous CABG        [name] [facility] 06/19/2019 9:04 AM

## 2019-06-19 NOTE — Progress Notes (Signed)
CARDIAC REHAB PHASE I   Post stent education completed with pt. Pt educated on importance of ASA, Brilinta, and NTG. Pt given heart healthy diet. Pt states wife has stent card. Reviewed restrictions, site care, and exercise guidelines. Will refer to CRP II [place], pt not interested in Virtual Cardiac Rehab at this time.   [id number] Rufina Falco, RN BSN 06/19/2019 12:05 PM

## 2019-06-19 NOTE — Discharge Summary (Signed)
Discharge Summary/SAME DAY PCI    Patient ID: Corey [name],  MRN: [id number], DOB/AGE: 12-23-1960 58 y.o.  Admit date: 06/19/2019 Discharge date: 06/19/2019  Primary Care Provider: Imagene Riches Primary Cardiologist: Dr. Agustin Cree   Discharge Diagnoses    Principal Problem:   Angina pectoris [facility]) Active Problems:   Benign essential HTN   OSA (obstructive sleep apnea)   Allergies No Known Allergies  Diagnostic Studies/Procedures    Cath: 06/19/19   Mid RCA lesion is 100% stenosed.  Mid Cx lesion is 90% stenosed.  Prox Cx to Mid Cx lesion is 60% stenosed.  A drug-eluting stent was successfully placed using a STENT RESOLUTE ONYX 3.0X26.  Post intervention, there is a 0% residual stenosis.  Post intervention, there is a 0% residual stenosis.  The left ventricular systolic function is normal.  LV end diastolic pressure is normal.  The left ventricular ejection fraction is 55-65% by visual estimate.   1. 2 vessel obstructive CAD.    - 90% mid LCx    - 100% CTO of the mid RCA. He has robust right to right and left to right collaterals. 2. Normal LV function 3. Normal LVEDP 4. Successful PCI of the LCx with DES x 1  Plan: DAPT with ASA and Brilnta for one year. Risk factor modification. Start high dose statin. I would treat the CTO of the RCA medically. He has excellent collaterals. If he should have refractory angina despite guideline recommended medical therapy we could consider CTO PCI but given the robust collaterals I doubt this will be necessary.   Diagnostic Dominance: Right  Intervention    _____________   History of Present Illness     Corey [name] is a 58 y.o. male  who presented for visit via audio/video conferencing for a telehealth visit with Dr. Agustin Cree.  PMH of hypertension, hyperlipidemia, family history of premature coronary artery disease, remote history of smoking who was referred because of chest pain.  He said that few  weeks prior to this virtual visit he went to [place] with his family he was riding a bike with his grandchildren and anytime he was trying to push himself a little harder he developed tightness squeezing in the chest shortness of breath was associated with this sensation.  He would have to stop, then the sensation went away his activities of normal daily living does not bring this sensation up.  He has been back since then to [place] was able to walk and do normal activity without symptoms His risk factors included family history of premature coronary artery disease his father actually got bypasses done twice first time before age of 36.  He used to smoke but quit years ago, he does have hypertension, he does have dyslipidemia. Given his symptoms, he was set up for outpatient cardiac cath.   Hospital Course     Underwent cardiac cath noted above with successful PCI/DESx1 to the mLcx, CTO of the mRCA with robust right to left collaterals. Plan for DAPT with ASA/Brilinta for at least one year. Seen by cardiac rehab while in short stay. No complications post cath. Radial site stable. Instructions/precautions regarding cath site care given prior to discharge. Added statin at discharge. Will need LFTs in 6-8 weeks if tolerating addition.    Corey [name] was seen by Dr. [place] and determined stable for discharge home. Follow up in the office has been arranged. Medications are listed below.   _____________  Discharge Vitals Blood  pressure (!) 139/91, pulse 64, temperature 97.6 F (36.4 C), temperature source Oral, resp. rate 18, height 5\' 6"  (1.676 m), weight 108.9 kg, SpO2 99 %.  Filed Weights   06/19/19 0612  Weight: 108.9 kg    Labs & Radiologic Studies    CBC No results for input(s): WBC, NEUTROABS, HGB, HCT, MCV, PLT in the last 72 hours. Basic Metabolic Panel No results for input(s): NA, K, CL, CO2, GLUCOSE, BUN, CREATININE, CALCIUM, MG, PHOS in the last 72 hours. Liver  Function Tests No results for input(s): AST, ALT, ALKPHOS, BILITOT, PROT, ALBUMIN in the last 72 hours. No results for input(s): LIPASE, AMYLASE in the last 72 hours. Cardiac Enzymes No results for input(s): CKTOTAL, CKMB, CKMBINDEX, TROPONINI in the last 72 hours. BNP Invalid input(s): POCBNP D-Dimer No results for input(s): DDIMER in the last 72 hours. Hemoglobin A1C No results for input(s): HGBA1C in the last 72 hours. Fasting Lipid Panel No results for input(s): CHOL, HDL, LDLCALC, TRIG, CHOLHDL, LDLDIRECT in the last 72 hours. Thyroid Function Tests No results for input(s): TSH, T4TOTAL, T3FREE, THYROIDAB in the last 72 hours.  Invalid input(s): FREET3 _____________  No results found. Disposition   Pt is being discharged home today in good condition.  Follow-up Plans & Appointments    Follow-up Information    Georgeanna LeaKrasowski, Robert J, MD Follow up on 06/26/2019.   Specialty: Cardiology Why: at 3:55pm for your follow up appt.  Contact information: [address] [place] [id number] [phone]          Discharge Instructions    Amb Referral to Cardiac Rehabilitation   Complete by: As directed    Diagnosis: Coronary Stents   After initial evaluation and assessments completed: Virtual Based Care may be provided alone or in conjunction with Phase 2 Cardiac Rehab based on patient barriers.: No       Discharge Medications     Medication List    STOP taking these medications   celecoxib 200 MG capsule Commonly known as: CELEBREX     TAKE these medications   acetaminophen 500 MG tablet Commonly known as: TYLENOL Take 1,000 mg by mouth daily.   ALPRAZolam 1 MG tablet Commonly known as: XANAX Take 0.5-1 mg by mouth at bedtime as needed for anxiety or sleep.   aspirin EC 81 MG tablet Take 1 tablet (81 mg total) by mouth daily.   atorvastatin 80 MG tablet Commonly known as: Lipitor Take 1 tablet (80 mg total) by mouth daily.    losartan-hydrochlorothiazide 100-25 MG tablet Commonly known as: HYZAAR Take 1 tablet by mouth daily.   metoprolol tartrate 25 MG tablet Commonly known as: LOPRESSOR Take 1 tablet (25 mg total) by mouth 2 (two) times daily.   nitroGLYCERIN 0.4 MG SL tablet Commonly known as: NITROSTAT Place 1 tablet (0.4 mg total) under the tongue every 5 (five) minutes as needed for chest pain.   OMEGA-3 FISH OIL PO Take 1 tablet by mouth daily.   ticagrelor 90 MG Tabs tablet Commonly known as: Brilinta Take 1 tablet (90 mg total) by mouth 2 (two) times daily.          Outstanding Labs/Studies   FLP/LFTs in 6 weeks.   Duration of Discharge Encounter   Greater than 30 minutes including physician time.  Signed, Laverda PageLindsay Caroly Purewal NP-C 06/19/2019, 1:56 PM

## 2019-06-19 NOTE — Progress Notes (Signed)
Cardiac rehab in to see pt. 

## 2019-06-22 ENCOUNTER — Ambulatory Visit: Payer: Managed Care, Other (non HMO) | Admitting: Cardiology

## 2019-06-22 ENCOUNTER — Encounter (HOSPITAL_COMMUNITY): Payer: Self-pay | Admitting: Cardiology

## 2019-06-26 ENCOUNTER — Ambulatory Visit (INDEPENDENT_AMBULATORY_CARE_PROVIDER_SITE_OTHER): Payer: Managed Care, Other (non HMO) | Admitting: Cardiology

## 2019-06-26 ENCOUNTER — Other Ambulatory Visit: Payer: Self-pay

## 2019-06-26 VITALS — BP 142/64 | HR 68 | Ht 66.0 in | Wt 245.8 lb

## 2019-06-26 DIAGNOSIS — I2510 Atherosclerotic heart disease of native coronary artery without angina pectoris: Secondary | ICD-10-CM | POA: Insufficient documentation

## 2019-06-26 DIAGNOSIS — I209 Angina pectoris, unspecified: Secondary | ICD-10-CM

## 2019-06-26 DIAGNOSIS — I10 Essential (primary) hypertension: Secondary | ICD-10-CM

## 2019-06-26 HISTORY — DX: Atherosclerotic heart disease of native coronary artery without angina pectoris: I25.10

## 2019-06-26 NOTE — Progress Notes (Addendum)
Cardiology Office Note:    Date:  06/26/2019   ID:  Corey PesterVernon T Fawley, DOB December 04, 1960, MRN [id number]  PCP:  Dema SeverinYork, Regina F, NP  Cardiologist:  Gypsy Balsamobert Krasowski, MD    Referring MD: Dema SeverinYork, Regina F, NP   Chief Complaint  Patient presents with  . Follow-up  Doing much better  History of Present Illness:    Corey [name] is a 58 y.o. male came to me with typical angina pectoris he was referred for cardiac catheterization which was done that showed completely occluded right coronary artery as well as critically narrowed circumflex artery.  The circumflex artery was stented.  The right coronary artery was left alone since there was very good collateralization.  Since that time he is doing fine he got a lot of questions all his questions were answered to his satisfaction.  He did have some shortness of breath initially probably because of Brilinta but now he seems to be doing well.  Past Medical History:  Diagnosis Date  . Arthritis   . Benign essential HTN 02/10/2016  . GERD (gastroesophageal reflux disease)    occ  . History of kidney stones   . Obesity   . Shortness of breath    occ-allergies  . Sleep apnea    cpap 16 yrs    Past Surgical History:  Procedure Laterality Date  . ANTERIOR CERVICAL DECOMP/DISCECTOMY FUSION N/A 06/03/2014   Procedure: ANTERIOR CERVICAL DECOMPRESSION FUSION, CERVICAL FIVE-SIX, CERVICAL SIX-SEVEN WITH INSTRUMENTATION AND ALLOGRAFT;  Surgeon: Emilee HeroMark Leonard Dumonski, MD;  Location: [facility] OR;  Service: Orthopedics;  Laterality: N/A;  . CORONARY STENT INTERVENTION N/A 06/19/2019   Procedure: CORONARY STENT INTERVENTION;  Surgeon: SwazilandJordan, Peter M, MD;  [facility];  Service: Cardiovascular;  Laterality: N/A;  . I&D EXTREMITY Left 11/10/2018   Procedure: IRRIGATION AND DEBRIDEMENT EXTREMITY, REVISION AMPUTATION OF LEFT RING FINGER;  Surgeon: Mack Hookhompson, David, MD;  [facility];  Service: Orthopedics;  Laterality: Left;  . LEFT HEART CATH AND  CORONARY ANGIOGRAPHY N/A 06/19/2019   Procedure: LEFT HEART CATH AND CORONARY ANGIOGRAPHY;  Surgeon: SwazilandJordan, Peter M, MD;  [facility];  Service: Cardiovascular;  Laterality: N/A;  . URETHRAL DILATION     child    Current Medications: Current Meds  Medication Sig  . acetaminophen (TYLENOL) 500 MG tablet Take 1,000 mg by mouth daily.  [name]. ALPRAZolam (XANAX) 1 MG tablet Take 0.5-1 mg by mouth at bedtime as needed for anxiety or sleep.   [name]. aspirin EC 81 MG tablet Take 1 tablet (81 mg total) by mouth daily.  [name]. atorvastatin (LIPITOR) 80 MG tablet Take 1 tablet (80 mg total) by mouth daily.  [name]. losartan-hydrochlorothiazide (HYZAAR) 100-25 MG tablet Take 1 tablet by mouth daily.  . metoprolol tartrate (LOPRESSOR) 25 MG tablet Take 1 tablet (25 mg total) by mouth 2 (two) times daily.  . nitroGLYCERIN (NITROSTAT) 0.4 MG SL tablet Place 1 tablet (0.4 mg total) under the tongue every 5 (five) minutes as needed for chest pain.  . Omega-3 Fatty Acids (OMEGA-3 FISH OIL PO) Take 1 tablet by mouth daily.  . ticagrelor (BRILINTA) 90 MG TABS tablet Take 1 tablet (90 mg total) by mouth 2 (two) times daily.     Allergies:   Patient has no known allergies.   Social History   Socioeconomic History  . Marital status: Married    Spouse name: Not on file  . Number of children: Not on file  . Years of education:  Not on file  . Highest education level: Not on file  Occupational History  . Not on file  Social Needs  . Financial resource strain: Not on file  . Food insecurity    Worry: Not on file    Inability: Not on file  . Transportation needs    Medical: Not on file    Non-medical: Not on file  Tobacco Use  . Smoking status: Former Smoker    Packs/day: 2.00    Years: 30.00    Pack years: 60.00    Types: Cigarettes    Quit date: 05/24/2012    Years since quitting: 7.0  . Smokeless tobacco: Never Used  Substance and Sexual Activity  . Alcohol use: No  . Drug use: No  . Sexual  activity: Not on file  Lifestyle  . Physical activity    Days per week: Not on file    Minutes per session: Not on file  . Stress: Not on file  Relationships  . Social Musicianconnections    Talks on phone: Not on file    Gets together: Not on file    Attends religious service: Not on file    Active member of club or organization: Not on file    Attends meetings of clubs or organizations: Not on file    Relationship status: Not on file  Other Topics Concern  . Not on file  Social History Narrative  . Not on file     Family History: The patient's family history includes Arrhythmia in his mother; CAD (age of onset: 731) in his father; Heart failure in his mother. ROS:   Please see the history of present illness.    All 14 point review of systems negative except as described per history of present illness  EKGs/Labs/Other Studies Reviewed:      Recent Labs: 06/15/2019: BUN 15; Creatinine, Ser 0.89; Hemoglobin 14.8; Platelets 278; Potassium 4.2; Sodium 138  Recent Lipid Panel No results found for: CHOL, TRIG, HDL, CHOLHDL, VLDL, LDLCALC, LDLDIRECT  Physical Exam:    VS:  BP (!) 142/64   Pulse 68   Ht 5\' 6"  (1.676 m)   Wt 245 lb 12.8 oz (111.5 kg)   SpO2 98%   BMI 39.67 kg/m     Wt Readings from Last 3 Encounters:  06/26/19 245 lb 12.8 oz (111.5 kg)  06/19/19 240 lb (108.9 kg)  06/15/19 245 lb (111.1 kg)     GEN:  Well nourished, well developed in no acute distress HEENT: Normal NECK: No JVD; No carotid bruits LYMPHATICS: No lymphadenopathy CARDIAC: RRR, no murmurs, no rubs, no gallops RESPIRATORY:  Clear to auscultation without rales, wheezing or rhonchi  ABDOMEN: Soft, non-tender, non-distended MUSCULOSKELETAL:  No edema; No deformity  SKIN: Warm and dry LOWER EXTREMITIES: no swelling NEUROLOGIC:  Alert and oriented x 3 PSYCHIATRIC:  Normal affect   ASSESSMENT:    1. Angina pectoris (HCC)   2. Coronary artery disease involving native coronary artery of native  heart without angina pectoris   3. Essential hypertension    PLAN:    In order of problems listed above:  1. Angina pectoris now gone.  Did have significant coronary artery disease that being addressed with stenting he does have completely occluded right coronary artery but very well collateralization.  Therefore we will continue medical therapy.  He is asymptomatic right now. 2. We did review his cardiac catheterization explained to him everything what was done.  We talked about healthy lifestyle including exercise  on regular basis as well as good diet 3. Essential hypertension blood pressure slightly elevated right now. 4. 3.  Dyslipidemia. 5. We will check his fasting lipid profile.  He is on high intensity statin which I will continue  He needs to stay away from work for 2 to 3 weeks after his procedure.   Medication Adjustments/Labs and Tests Ordered: Current medicines are reviewed at length with the patient today.  Concerns regarding medicines are outlined above.  No orders of the defined types were placed in this encounter.  Medication changes: No orders of the defined types were placed in this encounter.   Signed, Park Liter, MD, [facility] 06/26/2019 4:33 PM    [place]

## 2019-06-26 NOTE — Patient Instructions (Signed)
Medication Instructions:  Your physician recommends that you continue on your current medications as directed. Please refer to the Current Medication list given to you today.  If you need a refill on your cardiac medications before your next appointment, please call your pharmacy.   Lab work: None.  If you have labs (blood work) drawn today and your tests are completely normal, you will receive your results only by: . MyChart Message (if you have MyChart) OR . A paper copy in the mail If you have any lab test that is abnormal or we need to change your treatment, we will call you to review the results.  Testing/Procedures: None.   Follow-Up: At [facility], you and your health needs are our priority.  As part of our continuing mission to provide you with exceptional heart care, we have created designated Provider Care Teams.  These Care Teams include your primary Cardiologist (physician) and Advanced Practice Providers (APPs -  Physician Assistants and Nurse Practitioners) who all work together to provide you with the care you need, when you need it. You will need a follow up appointment in 1 months.  Please call our office 2 months in advance to schedule this appointment.  You may see No primary care provider on file. or another member of our [facility] Provider Team in [place]: Brian Munley, MD . Rajan Revankar, MD  Any Other Special Instructions Will Be Listed Below (If Applicable).    

## 2019-06-30 ENCOUNTER — Telehealth: Payer: Self-pay | Admitting: Cardiology

## 2019-06-30 NOTE — Telephone Encounter (Signed)
Patient needs a letter to return to work 07/13/2019.

## 2019-07-07 ENCOUNTER — Encounter: Payer: Self-pay | Admitting: Emergency Medicine

## 2019-08-06 ENCOUNTER — Telehealth: Payer: Self-pay | Admitting: Cardiology

## 2019-08-06 NOTE — Telephone Encounter (Signed)
Called patient. He reports he has been having some intermittent left sided chest discomfort / tightness at times. He has also been feeling short of breath during the day but he does have to wear a mask now most of the day. He denies any current chest discomfort. When the discomfort is present he describes it as a non radiation dull pain only in the left side. He did wake up a week ago feeling very nauseas and dizzy, however it passed and he feels better. He was advised to go to the emergency department if the discomfort returned. He verbally understood. Otherwise will consult with Dr. Agustin Cree for any further recommendations.

## 2019-08-06 NOTE — Telephone Encounter (Signed)
Patient has no energy most of the time, please advise.

## 2019-08-06 NOTE — Telephone Encounter (Signed)
Called patient informed him to follow previous instruction per Dr. Agustin Cree patient should go to the emergency department if pain returns otherwise we will follow up with him on 08/13/2019.

## 2019-08-13 ENCOUNTER — Encounter: Payer: Self-pay | Admitting: Cardiology

## 2019-08-13 ENCOUNTER — Other Ambulatory Visit: Payer: Self-pay

## 2019-08-13 ENCOUNTER — Ambulatory Visit (INDEPENDENT_AMBULATORY_CARE_PROVIDER_SITE_OTHER): Payer: Managed Care, Other (non HMO) | Admitting: Cardiology

## 2019-08-13 VITALS — BP 130/72 | HR 70 | Ht 66.0 in | Wt 247.0 lb

## 2019-08-13 DIAGNOSIS — I209 Angina pectoris, unspecified: Secondary | ICD-10-CM

## 2019-08-13 DIAGNOSIS — I10 Essential (primary) hypertension: Secondary | ICD-10-CM | POA: Diagnosis not present

## 2019-08-13 DIAGNOSIS — I2510 Atherosclerotic heart disease of native coronary artery without angina pectoris: Secondary | ICD-10-CM

## 2019-08-13 MED ORDER — ROSUVASTATIN CALCIUM 20 MG PO TABS: 20.0000 mg | ORAL_TABLET | Freq: Every day | ORAL | 1 refills | Status: DC

## 2019-08-13 MED ORDER — RANOLAZINE ER 500 MG PO TB12: 500.0000 mg | ORAL_TABLET | Freq: Two times a day (BID) | ORAL | 2 refills | Status: DC

## 2019-08-13 NOTE — Progress Notes (Signed)
Cardiology Office Note:    Date:  08/13/2019   ID:  Corey [name], DOB March 03, 1961, MRN [id number]  PCP:  Sharlene DoryWendling, Nicholas Paul, DO  Cardiologist:  Gypsy Balsamobert Jermichael Belmares, MD    Referring MD: Dema SeverinYork, Regina F, NP   Chief Complaint  Patient presents with  . 1 month follow up  Doing better  History of Present Illness:    Corey [name] is a 58 y.o. male with coronary artery disease status post recent PTCA and drug-eluting stent to mid circumflex artery.  He also residual completely occluded right coronary artery.  Ejection fraction is normal.  Completely occluded right coronary artery get very good collateralization.  Also hypertension dyslipidemia comes today to my office for follow-up overall doing well complain of having some atypical symptoms like pain in the left arm when he moves his shoulder.  Overall was able to work hard and have no difficulty doing it.    Past Medical History:  Diagnosis Date  . Arthritis   . Benign essential HTN 02/10/2016  . GERD (gastroesophageal reflux disease)    occ  . History of kidney stones   . Obesity   . Shortness of breath    occ-allergies  . Sleep apnea    cpap 16 yrs    Past Surgical History:  Procedure Laterality Date  . ANTERIOR CERVICAL DECOMP/DISCECTOMY FUSION N/A 06/03/2014   Procedure: ANTERIOR CERVICAL DECOMPRESSION FUSION, CERVICAL FIVE-SIX, CERVICAL SIX-SEVEN WITH INSTRUMENTATION AND ALLOGRAFT;  Surgeon: Emilee HeroMark Leonard Dumonski, MD;  Location: [facility] OR;  Service: Orthopedics;  Laterality: N/A;  . CORONARY STENT INTERVENTION N/A 06/19/2019   Procedure: CORONARY STENT INTERVENTION;  Surgeon: SwazilandJordan, Peter M, MD;  [facility];  Service: Cardiovascular;  Laterality: N/A;  . I&D EXTREMITY Left 11/10/2018   Procedure: IRRIGATION AND DEBRIDEMENT EXTREMITY, REVISION AMPUTATION OF LEFT RING FINGER;  Surgeon: Mack Hookhompson, David, MD;  [facility];  Service: Orthopedics;  Laterality: Left;  . LEFT HEART CATH AND CORONARY ANGIOGRAPHY  N/A 06/19/2019   Procedure: LEFT HEART CATH AND CORONARY ANGIOGRAPHY;  Surgeon: SwazilandJordan, Peter M, MD;  [facility];  Service: Cardiovascular;  Laterality: N/A;  . URETHRAL DILATION     child    Current Medications: Current Meds  Medication Sig  . acetaminophen (TYLENOL) 500 MG tablet Take 1,000 mg by mouth daily.  [name]. ALPRAZolam (XANAX) 1 MG tablet Take 0.5-1 mg by mouth at bedtime as needed for anxiety or sleep.   [name]. aspirin EC 81 MG tablet Take 1 tablet (81 mg total) by mouth daily.  [name]. losartan-hydrochlorothiazide (HYZAAR) 100-25 MG tablet Take 1 tablet by mouth daily.  . nitroGLYCERIN (NITROSTAT) 0.4 MG SL tablet Place 1 tablet (0.4 mg total) under the tongue every 5 (five) minutes as needed for chest pain.  . Omega-3 Fatty Acids (OMEGA-3 FISH OIL PO) Take 1 tablet by mouth daily.  . ticagrelor (BRILINTA) 90 MG TABS tablet Take 1 tablet (90 mg total) by mouth 2 (two) times daily.     Allergies:   Patient has no known allergies.   Social History   Socioeconomic History  . Marital status: Married    Spouse name: Not on file  . Number of children: Not on file  . Years of education: Not on file  . Highest education level: Not on file  Occupational History  . Not on file  Social Needs  . Financial resource strain: Not on file  . Food insecurity    Worry: Not on file  Inability: Not on file  . Transportation needs    Medical: Not on file    Non-medical: Not on file  Tobacco Use  . Smoking status: Former Smoker    Packs/day: 2.00    Years: 30.00    Pack years: 60.00    Types: Cigarettes    Quit date: 05/24/2012    Years since quitting: 7.2  . Smokeless tobacco: Never Used  Substance and Sexual Activity  . Alcohol use: No  . Drug use: No  . Sexual activity: Not on file  Lifestyle  . Physical activity    Days per week: Not on file    Minutes per session: Not on file  . Stress: Not on file  Relationships  . Social Musician on phone: Not on  file    Gets together: Not on file    Attends religious service: Not on file    Active member of club or organization: Not on file    Attends meetings of clubs or organizations: Not on file    Relationship status: Not on file  Other Topics Concern  . Not on file  Social History Narrative  . Not on file     Family History: The patient's family history includes Arrhythmia in his mother; CAD (age of onset: 41) in his father; Heart failure in his mother. ROS:   Please see the history of present illness.    All 14 point review of systems negative except as described per history of present illness  EKGs/Labs/Other Studies Reviewed:      Recent Labs: 06/15/2019: BUN 15; Creatinine, Ser 0.89; Hemoglobin 14.8; Platelets 278; Potassium 4.2; Sodium 138  Recent Lipid Panel No results found for: CHOL, TRIG, HDL, CHOLHDL, VLDL, LDLCALC, LDLDIRECT  Physical Exam:    VS:  BP 130/72   Pulse 70   Ht 5\' 6"  (1.676 m)   Wt 247 lb (112 kg)   SpO2 98%   BMI 39.87 kg/m     Wt Readings from Last 3 Encounters:  08/13/19 247 lb (112 kg)  06/26/19 245 lb 12.8 oz (111.5 kg)  06/19/19 240 lb (108.9 kg)     GEN:  Well nourished, well developed in no acute distress HEENT: Normal NECK: No JVD; No carotid bruits LYMPHATICS: No lymphadenopathy CARDIAC: RRR, no murmurs, no rubs, no gallops RESPIRATORY:  Clear to auscultation without rales, wheezing or rhonchi  ABDOMEN: Soft, non-tender, non-distended MUSCULOSKELETAL:  No edema; No deformity  SKIN: Warm and dry LOWER EXTREMITIES: no swelling NEUROLOGIC:  Alert and oriented x 3 PSYCHIATRIC:  Normal affect   ASSESSMENT:    1. Coronary artery disease involving native coronary artery of native heart without angina pectoris   2. Essential hypertension   3. Benign essential HTN   4. Angina pectoris (HCC)    PLAN:    In order of problems listed above:  1. Coronary disease status post PTCA and stenting to mid circumflex artery.  Residual  completely occlusion of right coronary artery.  We spent a great deal of time talking about his issues we did review cardiac catheterization to better show him what was done I stressed importance of taking dual antiplatelet therapy without interruption.  I will give him prescription for ranolazine 500 mg twice daily today. 2. Essential hypertension blood pressure appears to be well controlled continue present management. 3. Angina pectoris does have some atypical symptoms hopefully addition of ranolazine will help. 4. Dyslipidemia.  I will put him on Crestor 20.  He tried Lipitor but had some muscle aches.  He may not be able to tolerate Crestor desiccation with will think about Zetia and then PCSK9 agent 5. We talk about diet and exercises   Medication Adjustments/Labs and Tests Ordered: Current medicines are reviewed at length with the patient today.  Concerns regarding medicines are outlined above.  No orders of the defined types were placed in this encounter.  Medication changes: No orders of the defined types were placed in this encounter.   Signed, Park Liter, MD, [facility] 08/13/2019 3:46 PM    [place]

## 2019-08-13 NOTE — Addendum Note (Signed)
Addended by: Ashok Norris on: 08/13/2019 03:55 PM   Modules accepted: Orders

## 2019-08-13 NOTE — Patient Instructions (Signed)
Medication Instructions:  Your physician has recommended you make the following change in your medication:  Start: Crestor 20 mg daily  Start: Ranexa 500 mg twice daily.  If you need a refill on your cardiac medications before your next appointment, please call your pharmacy.   Lab work: Your physician recommends that you return for lab work today: lipids   If you have labs (blood work) drawn today and your tests are completely normal, you will receive your results only by: [name] MyChart Message (if you have MyChart) OR . A paper copy in the mail If you have any lab test that is abnormal or we need to change your treatment, we will call you to review the results.  Testing/Procedures: None.   Follow-Up: At [facility], you and your health needs are our priority.  As part of our continuing mission to provide you with exceptional heart care, we have created designated Provider Care Teams.  These Care Teams include your primary Cardiologist (physician) and Advanced Practice Providers (APPs -  Physician Assistants and Nurse Practitioners) who all work together to provide you with the care you need, when you need it. You will need a follow up appointment in 6 weeks.  Please call our office 2 months in advance to schedule this appointment.  You may see No primary care provider on file. or another member of our [employer] Provider Team in [place]: Shirlee More, MD . Jyl Heinz, MD  Any Other Special Instructions Will Be Listed Below (If Applicable).  Rosuvastatin Tablets What is this medicine? ROSUVASTATIN (roe SOO va sta tin) is known as a HMG-CoA reductase inhibitor or 'statin'. It lowers cholesterol and triglycerides in the blood. This drug may also reduce the risk of heart attack, stroke, or other health problems in patients with risk factors for heart disease. Diet and lifestyle changes are often used with this drug. This medicine may be used for other purposes; ask your health  care provider or pharmacist if you have questions. COMMON BRAND NAME(S): Crestor What should I tell my health care provider before I take this medicine? They need to know if you have any of these conditions:  diabetes  if you often drink alcohol  history of stroke  kidney disease  liver disease  muscle aches or weakness  thyroid disease  an unusual or allergic reaction to rosuvastatin, other medicines, foods, dyes, or preservatives  pregnant or trying to get pregnant  breast-feeding How should I use this medicine? Take this medicine by mouth with a glass of water. Follow the directions on the prescription label. Do not cut, crush or chew this medicine. You can take this medicine with or without food. Take your doses at regular intervals. Do not take your medicine more often than directed. Talk to your pediatrician regarding the use of this medicine in children. While this drug may be prescribed for children as young as 77 years old for selected conditions, precautions do apply. Overdosage: If you think you have taken too much of this medicine contact a poison control center or emergency room at once. NOTE: This medicine is only for you. Do not share this medicine with others. What if I miss a dose? If you miss a dose, take it as soon as you can. If your next dose is to be taken in less than 12 hours, then do not take the missed dose. Take the next dose at your regular time. Do not take double or extra doses. What may interact  with this medicine? Do not take this medicine with any of the following medications:  herbal medicines like red yeast rice This medicine may also interact with the following medications:  alcohol  antacids containing aluminum hydroxide or magnesium hydroxide  cyclosporine  other medicines for high cholesterol  some medicines for HIV infection  warfarin This list may not describe all possible interactions. Give your health care provider a list of  all the medicines, herbs, non-prescription drugs, or dietary supplements you use. Also tell them if you smoke, drink alcohol, or use illegal drugs. Some items may interact with your medicine. What should I watch for while using this medicine? Visit your doctor or health care professional for regular check-ups. You may need regular tests to make sure your liver is working properly. Your health care professional may tell you to stop taking this medicine if you develop muscle problems. If your muscle problems do not go away after stopping this medicine, contact your health care professional. Do not become pregnant while taking this medicine. Women should inform their health care professional if they wish to become pregnant or think they might be pregnant. There is a potential for serious side effects to an unborn child. Talk to your health care professional or pharmacist for more information. Do not breast-feed an infant while taking this medicine. This medicine may increase blood sugar. Ask your healthcare provider if changes in diet or medicines are needed if you have diabetes. If you are going to need surgery or other procedure, tell your doctor that you are using this medicine. This drug is only part of a total heart-health program. Your doctor or a dietician can suggest a low-cholesterol and low-fat diet to help. Avoid alcohol and smoking, and keep a proper exercise schedule. This medicine may cause a decrease in Co-Enzyme Q-10. You should make sure that you get enough Co-Enzyme Q-10 while you are taking this medicine. Discuss the foods you eat and the vitamins you take with your health care professional. What side effects may I notice from receiving this medicine? Side effects that you should report to your doctor or health care professional as soon as possible:  allergic reactions like skin rash, itching or hives, swelling of the face, lips, or tongue  confusion  joint pain  loss of  memory  redness, blistering, peeling or loosening of the skin, including inside the mouth  signs and symptoms of high blood sugar such as being more thirsty or hungry or having to urinate more than normal. You may also feel very tired or have blurry vision.  signs and symptoms of muscle injury like dark urine; trouble passing urine or change in the amount of urine; unusually weak or tired; muscle pain or side or back pain  yellowing of the eyes or skin Side effects that usually do not require medical attention (report to your doctor or health care professional if they continue or are bothersome):  constipation  diarrhea  dizziness  gas  headache  nausea  stomach pain  trouble sleeping  upset stomach This list may not describe all possible side effects. Call your doctor for medical advice about side effects. You may report side effects to FDA at 1-800-FDA-1088. Where should I keep my medicine? Keep out of the reach of children. Store at room temperature between 20 and 25 degrees C (68 and 77 degrees F). Keep container tightly closed (protect from moisture). Throw away any unused medicine after the expiration date. NOTE: This sheet is a summary.  It may not cover all possible information. If you have questions about this medicine, talk to your doctor, pharmacist, or health care provider.  2020 Elsevier/Gold Standard (2018-09-04 08:25:08)   Ranolazine tablets, extended release What is this medicine? RANOLAZINE (ra NOE la zeen) is a heart medicine. It is used to treat chronic chest pain (angina). This medicine must be taken regularly. It will not relieve an acute episode of chest pain. This medicine may be used for other purposes; ask your health care provider or pharmacist if you have questions. COMMON BRAND NAME(S): Ranexa What should I tell my health care provider before I take this medicine? They need to know if you have any of these conditions:  heart disease  irregular  heartbeat  kidney disease  liver disease  low levels of potassium or magnesium in the blood  an unusual or allergic reaction to ranolazine, other medicines, foods, dyes, or preservatives  pregnant or trying to get pregnant  breast-feeding How should I use this medicine? Take this medicine by mouth with a glass of water. Follow the directions on the prescription label. Do not cut, crush, or chew this medicine. Take with or without food. Do not take this medication with grapefruit juice. Take your doses at regular intervals. Do not take your medicine more often then directed. Talk to your pediatrician regarding the use of this medicine in children. Special care may be needed. Overdosage: If you think you have taken too much of this medicine contact a poison control center or emergency room at once. NOTE: This medicine is only for you. Do not share this medicine with others. What if I miss a dose? If you miss a dose, take it as soon as you can. If it is almost time for your next dose, take only that dose. Do not take double or extra doses. What may interact with this medicine? Do not take this medicine with any of the following medications:  antivirals for HIV or AIDS  cerivastatin  certain antibiotics like chloramphenicol, clarithromycin, dalfopristin; quinupristin, isoniazid, rifabutin, rifampin, rifapentine  certain medicines used for cancer like imatinib, nilotinib  certain medicines for fungal infections like fluconazole, itraconazole, ketoconazole, posaconazole, voriconazole  certain medicines for irregular heart beat like dronedarone  certain medicines for seizures like carbamazepine, fosphenytoin, oxcarbazepine, phenobarbital, phenytoin  cisapride  conivaptan  cyclosporine  grapefruit or grapefruit juice  lumacaftor; ivacaftor  nefazodone  pimozide  quinacrine  St John's wort  thioridazine This medicine may also interact with the following  medications:  alfuzosin  certain medicines for depression, anxiety, or psychotic disturbances like bupropion, citalopram, fluoxetine, fluphenazine, paroxetine, perphenazine, risperidone, sertraline, trifluoperazine  certain medicines for cholesterol like atorvastatin, lovastatin, simvastatin  certain medicines for stomach problems like octreotide, palonosetron, prochlorperazine  eplerenone  ergot alkaloids like dihydroergotamine, ergonovine, ergotamine, methylergonovine  metformin  nicardipine  other medicines that prolong the QT interval (cause an abnormal heart rhythm) like dofetilide, ziprasidone  sirolimus  tacrolimus This list may not describe all possible interactions. Give your health care provider a list of all the medicines, herbs, non-prescription drugs, or dietary supplements you use. Also tell them if you smoke, drink alcohol, or use illegal drugs. Some items may interact with your medicine. What should I watch for while using this medicine? Visit your doctor for regular check ups. Tell your doctor or healthcare professional if your symptoms do not start to get better or if they get worse. This medicine will not relieve an acute attack of angina or chest pain. This  medicine can change your heart rhythm. Your health care provider may check your heart rhythm by ordering an electrocardiogram (ECG) while you are taking this medicine. You may get drowsy or dizzy. Do not drive, use machinery, or do anything that needs mental alertness until you know how this medicine affects you. Do not stand or sit up quickly, especially if you are an older patient. This reduces the risk of dizzy or fainting spells. Alcohol may interfere with the effect of this medicine. Avoid alcoholic drinks. If you are scheduled for any medical or dental procedure, tell your healthcare provider that you are taking this medicine. This medicine can interact with other medicines used during surgery. What side  effects may I notice from receiving this medicine? Side effects that you should report to your doctor or health care professional as soon as possible:  allergic reactions like skin rash, itching or hives, swelling of the face, lips, or tongue  breathing problems  changes in vision  fast, irregular or pounding heartbeat  feeling faint or lightheaded, falls  low or high blood pressure  numbness or tingling feelings  ringing in the ears  tremor or shakiness  slow heartbeat (fewer than 50 beats per minute)  swelling of the legs or feet Side effects that usually do not require medical attention (report to your doctor or health care professional if they continue or are bothersome):  constipation  drowsy  dry mouth  headache  nausea or vomiting  stomach upset This list may not describe all possible side effects. Call your doctor for medical advice about side effects. You may report side effects to FDA at 1-800-FDA-1088. Where should I keep my medicine? Keep out of the reach of children. Store at room temperature between 15 and 30 degrees C (59 and 86 degrees F). Throw away any unused medicine after the expiration date. NOTE: This sheet is a summary. It may not cover all possible information. If you have questions about this medicine, talk to your doctor, pharmacist, or health care provider.  2020 Elsevier/Gold Standard (2018-11-04 09:18:49)

## 2019-08-14 ENCOUNTER — Telehealth: Payer: Self-pay | Admitting: Emergency Medicine

## 2019-08-14 DIAGNOSIS — I2510 Atherosclerotic heart disease of native coronary artery without angina pectoris: Secondary | ICD-10-CM

## 2019-08-14 LAB — LIPID PANEL
Chol/HDL Ratio: 4.7 ratio (ref 0.0–5.0)
Cholesterol, Total: 170 mg/dL (ref 100–199)
HDL: 36 mg/dL — ABNORMAL LOW (ref >39)
LDL Chol Calc (NIH): 102 mg/dL — ABNORMAL HIGH (ref 0–99)
Triglycerides: 185 mg/dL — ABNORMAL HIGH (ref 0–149)
VLDL Cholesterol Cal: 32 mg/dL (ref 5–40)

## 2019-08-14 MED ORDER — ROSUVASTATIN CALCIUM 40 MG PO TABS: 40.0000 mg | ORAL_TABLET | Freq: Every day | ORAL | 1 refills | Status: DC

## 2019-08-14 NOTE — Telephone Encounter (Signed)
Called patient informed him of lab results and per Dr. Agustin Cree to increase crestor to 40 mg daily and have fasting labs in 6 weeks. He verbally understood and verbally agreed to see Laurann Montana, NP at next follow up. No further questions.

## 2019-09-10 ENCOUNTER — Other Ambulatory Visit: Payer: Self-pay | Admitting: Cardiology

## 2019-09-24 ENCOUNTER — Other Ambulatory Visit: Payer: Self-pay

## 2019-09-24 ENCOUNTER — Ambulatory Visit (INDEPENDENT_AMBULATORY_CARE_PROVIDER_SITE_OTHER): Payer: Managed Care, Other (non HMO) | Admitting: Family

## 2019-09-24 ENCOUNTER — Encounter: Payer: Self-pay | Admitting: Family

## 2019-09-24 VITALS — BP 134/76 | HR 68 | Ht 65.0 in | Wt 245.0 lb

## 2019-09-24 DIAGNOSIS — K219 Gastro-esophageal reflux disease without esophagitis: Secondary | ICD-10-CM | POA: Diagnosis not present

## 2019-09-24 DIAGNOSIS — I10 Essential (primary) hypertension: Secondary | ICD-10-CM

## 2019-09-24 DIAGNOSIS — I2510 Atherosclerotic heart disease of native coronary artery without angina pectoris: Secondary | ICD-10-CM | POA: Diagnosis not present

## 2019-09-24 DIAGNOSIS — E782 Mixed hyperlipidemia: Secondary | ICD-10-CM | POA: Diagnosis not present

## 2019-09-24 MED ORDER — RANOLAZINE ER 1000 MG PO TB12: 1000.0000 mg | ORAL_TABLET | Freq: Two times a day (BID) | ORAL | 0 refills | Status: DC

## 2019-09-24 MED ORDER — EZETIMIBE 10 MG PO TABS: 10.0000 mg | ORAL_TABLET | Freq: Every day | ORAL | 2 refills | Status: DC

## 2019-09-24 NOTE — Patient Instructions (Addendum)
Medication Instructions:  Your physician has recommended you make the following change in your medication:   START Zetia 10 mg (one tablet) daily **If you can't take this let me know  CHANGE Ranexa to 100mg  twice daily *you may use up your 500mg  tablets by taking two in the morning and two in the evening  *If you need a refill on your cardiac medications before your next appointment, please call your pharmacy*  Lab Work: No lab work today.  Please get labs done in 6 weeks at our office. You may stop by without an appointment 8a-4:30pm, avoid 12p-1p. We will check your lipid function, CMET (kidney/liver). This would be Dec 9th - 11th. You do need to be fasting for those labs.   If you have labs (blood work) drawn today and your tests are completely normal, you will receive your results only by: MyChart Message (if you have MyChart) OR . A paper copy in the mail If you have any lab test that is abnormal or we need to change your treatment, we will call you to review the results.  Testing/Procedures: You had an EKG today - it showed normal sinus rhythm.   Follow-Up: At [facility], you and your health needs are our priority.  As part of our continuing mission to provide you with exceptional heart care, we have created designated Provider Care Teams.  These Care Teams include your primary Cardiologist (physician) and Advanced Practice Providers (APPs -  Physician Assistants and Nurse Practitioners) who all work together to provide you with the care you need, when you need it.  Your next appointment:   3 months  The format for your next appointment:   In Person  Provider:   [name], MD  Other Instructions  Would recommend over the counter Omeprazole (Prilosec) or Nexium daily for relief of indigestion.  Recommend low salt diet.   Lower extremity swelling likely due to venous insufficiency and sodium intake. Recommend low salt diet. Recommend compression or "diabetic  socks".   It may help to keep a food log to track your sodium intake.   Ezetimibe Tablets What is this medicine? EZETIMIBE (ez ET i mibe) blocks the absorption of cholesterol from the stomach. It can help lower blood cholesterol for patients who are at risk of getting heart disease or a stroke. It is only for patients whose cholesterol level is not controlled by diet. This medicine may be used for other purposes; ask your health care provider or pharmacist if you have questions. COMMON BRAND NAME(S): Zetia What should I tell my health care provider before I take this medicine? They need to know if you have any of these conditions:  liver disease  an unusual or allergic reaction to ezetimibe, medicines, foods, dyes, or preservatives  pregnant or trying to get pregnant  breast-feeding How should I use this medicine? Take this medicine by mouth with a glass of water. Follow the directions on the prescription label. This medicine can be taken with or without food. Take your doses at regular intervals. Do not take your medicine more often than directed. Talk to your pediatrician regarding the use of this medicine in children. Special care may be needed. Overdosage: If you think you have taken too much of this medicine contact a poison control center or emergency room at once. NOTE: This medicine is only for you. Do not share this medicine with others. What if I miss a dose? If you miss a dose, take it as  soon as you can. If it is almost time for your next dose, take only that dose. Do not take double or extra doses. What may interact with this medicine? Do not take this medicine with any of the following medications:  fenofibrate  gemfibrozil This medicine may also interact with the following medications:  antacids  cyclosporine  herbal medicines like red yeast rice  other medicines to lower cholesterol or triglycerides This list may not describe all possible interactions. Give  your health care provider a list of all the medicines, herbs, non-prescription drugs, or dietary supplements you use. Also tell them if you smoke, drink alcohol, or use illegal drugs. Some items may interact with your medicine. What should I watch for while using this medicine? Visit your doctor or health care professional for regular checks on your progress. You will need to have your cholesterol levels checked. If you are also taking some other cholesterol medicines, you will also need to have tests to make sure your liver is working properly. Tell your doctor or health care professional if you get any unexplained muscle pain, tenderness, or weakness, especially if you also have a fever and tiredness. You need to follow a low-cholesterol, low-fat diet while you are taking this medicine. This will decrease your risk of getting heart and blood vessel disease. Exercising and avoiding alcohol and smoking can also help. Ask your doctor or dietician for advice. What side effects may I notice from receiving this medicine? Side effects that you should report to your doctor or health care professional as soon as possible:  allergic reactions like skin rash, itching or hives, swelling of the face, lips, or tongue  dark yellow or brown urine  unusually weak or tired  yellowing of the skin or eyes Side effects that usually do not require medical attention (report to your doctor or health care professional if they continue or are bothersome):  diarrhea  dizziness  headache  stomach upset or pain This list may not describe all possible side effects. Call your doctor for medical advice about side effects. You may report side effects to FDA at 1-800-FDA-1088. Where should I keep my medicine? Keep out of the reach of children. Store at room temperature between 15 and 30 degrees C (59 and 86 degrees F). Protect from moisture. Keep container tightly closed. Throw away any unused medicine after the  expiration date. NOTE: This sheet is a summary. It may not cover all possible information. If you have questions about this medicine, talk to your doctor, pharmacist, or health care provider.  2020 Elsevier/Gold Standard (2012-05-19 15:39:09)

## 2019-09-24 NOTE — Progress Notes (Signed)
Office Visit    Patient Name: Corey [name] Date of Encounter: 09/24/2019  Primary Care Provider:  Shelda Pal, DO Primary Cardiologist:  Jenne Campus, MD Electrophysiologist:  None   Chief Complaint    Corey [name] is a 58 y.o. male with a hx of CAD (s/p PTI/DES to mid Cx, CTO of mRCA with robust right to left collaterals by cath 06/19/19), HTN, HLD presents today for follow up after initiation of Ranexa and Crestor.   Past Medical History    Past Medical History:  Diagnosis Date  . Arthritis   . Benign essential HTN 02/10/2016  . GERD (gastroesophageal reflux disease)    occ  . History of kidney stones   . Obesity   . Shortness of breath    occ-allergies  . Sleep apnea    cpap 16 yrs   Past Surgical History:  Procedure Laterality Date  . ANTERIOR CERVICAL DECOMP/DISCECTOMY FUSION N/A 06/03/2014   Procedure: ANTERIOR CERVICAL DECOMPRESSION FUSION, CERVICAL FIVE-SIX, CERVICAL SIX-SEVEN WITH INSTRUMENTATION AND ALLOGRAFT;  Surgeon: Sinclair Ship, MD;  Location: [place];  Service: Orthopedics;  Laterality: N/A;  . CORONARY STENT INTERVENTION N/A 06/19/2019   Procedure: CORONARY STENT INTERVENTION;  Surgeon: Martinique, Peter M, MD;  Location: [place] [facility];  Service: Cardiovascular;  Laterality: N/A;  . I&D EXTREMITY Left 11/10/2018   Procedure: IRRIGATION AND DEBRIDEMENT EXTREMITY, REVISION AMPUTATION OF LEFT RING FINGER;  Surgeon: Milly Jakob, MD;  Location: [place];  Service: Orthopedics;  Laterality: Left;  . LEFT HEART CATH AND CORONARY ANGIOGRAPHY N/A 06/19/2019   Procedure: LEFT HEART CATH AND CORONARY ANGIOGRAPHY;  Surgeon: Martinique, Peter M, MD;  Location: [place] [facility];  Service: Cardiovascular;  Laterality: N/A;  . URETHRAL DILATION     child    Allergies  No Known Allergies  History of Present Illness    Corey [name] is a 58 y.o. male with a hx of  CAD (s/p PTI/DES to mid Cx, CTO of mRCA with robust right to left  collaterals by cath 06/19/19), HTN, HLD last seen 08/13/19 by Dr. Agustin Cree.  Cardiac cath ordered for anginal symptoms of chest tightness and dyspnea on exertion. Cath 06/19/19 with DES to Mid Cx noted CTO mRCA with robust R to L collaterals. Recommended for DAPT of Brilinta/Asa for 1 year.  At last office visit transitioned for Atorvastatin to Crestor and added Ranexa. Tells me he was intolerant of Crestor with noted muscle aches in his shoulders and hips. He had a similar reaction with atorvastatin. We discussed LDL goal of less than 70 in setting of CAD and he is agreeable to try Zetia.  Tells me his mother had a cardiac catheterization today and he is awaiting to hear from her on whether she gets to come home tonight or tomorrow. Tells me she had 2 blockages but he is not sure what they did. Told him we would be thinking of his mother and hope she does well.  He reports indigestion that feels like burning in his esophagus. Tells me he has had this previously and it was resolved with PPI. He has been taking Alka-Seltzer's with good result. We discussed initiation of over-the-counter PPI such as Nexium or Prilosec.  Reports no recurrent chest pain. Reports no shortness of breath at rest. Reports very mild lower extremity edema after standing on his feet all day at work. We discussed etiology of venous insufficiency and recommended watching sodium intake, elevating lower extremities, compression stockings.  He was offered cardiac rehab while in the hospital but declines and tells me he walks a lot at work and feels that this is adequate exercise. Encouraged to start regular exercise regimen. Tells me he still feels like he gets tired throughout the day. Tells me this is a little bit better than before his cardiac catheterization. Tells me since the initiation of Ranexa his chest does not feel heavy. We discussed up titration of his Ranexa and he is agreeable.  EKGs/Labs/Other Studies Reviewed:   The  following studies were reviewed today: Cardiac cath 05/2019  Mid RCA lesion is 100% stenosed.  Mid Cx lesion is 90% stenosed.  Prox Cx to Mid Cx lesion is 60% stenosed.  A drug-eluting stent was successfully placed using a STENT RESOLUTE ONYX 3.0X26.  Post intervention, there is a 0% residual stenosis.  Post intervention, there is a 0% residual stenosis.  The left ventricular systolic function is normal.  LV end diastolic pressure is normal.  The left ventricular ejection fraction is 55-65% by visual estimate.   1. 2 vessel obstructive CAD.    - 90% mid LCx    - 100% CTO of the mid RCA. He has robust right to right and left to right collaterals. 2. Normal LV function 3. Normal LVEDP 4. Successful PCI of the LCx with DES x 1   Plan: DAPT with ASA and Brilnta for one year. Risk factor modification. Start high dose statin. I would treat the CTO of the RCA medically. He has excellent collaterals. If he should have refractory angina despite guideline recommended medical therapy we could consider CTO PCI but given the robust collaterals I doubt this will be necessary.   Dominance: Right Intervention   EKG:  EKG is ordered today.  The ekg ordered today demonstrates sinus rhythm rate 63 bpm with normal QTC 401 and no acute ST/T wave changes.  Recent Labs: 06/15/2019: BUN 15; Creatinine, Ser 0.89; Hemoglobin 14.8; Platelets 278; Potassium 4.2; Sodium 138  Recent Lipid Panel    Component Value Date/Time   CHOL 170 08/13/2019 1602   TRIG 185 (H) 08/13/2019 1602   HDL 36 (L) 08/13/2019 1602   CHOLHDL 4.7 08/13/2019 1602   LDLCALC 102 (H) 08/13/2019 1602    Home Medications   Current Meds  Medication Sig  . acetaminophen (TYLENOL) 500 MG tablet Take 1,000 mg by mouth daily.  [name] ALPRAZolam (XANAX) 1 MG tablet Take 0.5-1 mg by mouth at bedtime as needed for anxiety or sleep.   [name] aspirin EC 81 MG tablet Take 1 tablet (81 mg total) by mouth daily.  [name] BRILINTA 90 MG TABS tablet  Take 1 tablet by mouth twice daily  . Celecoxib (CELEBREX PO) Take 1 tablet by mouth daily.  [name] losartan-hydrochlorothiazide (HYZAAR) 100-25 MG tablet Take 1 tablet by mouth daily.  . nitroGLYCERIN (NITROSTAT) 0.4 MG SL tablet Place 1 tablet (0.4 mg total) under the tongue every 5 (five) minutes as needed for chest pain.  . Omega-3 Fatty Acids (OMEGA-3 FISH OIL PO) Take 1 tablet by mouth daily.  . [DISCONTINUED] ranolazine (RANEXA) 500 MG 12 hr tablet Take 1 tablet (500 mg total) by mouth 2 (two) times daily.      Review of Systems      Review of Systems  Constitution: Positive for malaise/fatigue. Negative for chills and fever.  Cardiovascular: Positive for leg swelling. Negative for chest pain, dyspnea on exertion, irregular heartbeat, near-syncope, orthopnea and palpitations.  Respiratory: Negative for cough, shortness of breath  and wheezing.   Musculoskeletal:       Muscle aches while on Rosuvastatin  Gastrointestinal: Positive for heartburn. Negative for nausea and vomiting.  Neurological: Negative for dizziness, light-headedness and weakness.   All other systems reviewed and are otherwise negative except as noted above.  Physical Exam    VS:  BP 134/76 (BP Location: Left Arm, Patient Position: Sitting, Cuff Size: Large)   Pulse 68   Ht '5\' 5"'  (1.651 m)   Wt 245 lb (111.1 kg)   SpO2 96%   BMI 40.77 kg/m  , BMI Body mass index is 40.77 kg/m. GEN: Well nourished, well developed, in no acute distress. HEENT: normal. Neck: Supple, no JVD, carotid bruits, or masses. Cardiac: RRR, no murmurs, rubs, or gallops. No clubbing, cyanosis, edema.  Radials/DP/PT 2+ and equal bilaterally.  Respiratory:  Respirations regular and unlabored, clear to auscultation bilaterally. GI: Soft, nontender, nondistended, BS + x 4. MS: No deformity or atrophy. Skin: Warm and dry, no rash. Neuro:  Strength and sensation are intact. Psych: Normal affect.   Assessment & Plan    1. CAD -s/p PCI/DES to  mid circumflex, CTO of mid RCA with robust right to left collaterals by cath 05/30/2019. We again reviewed his cardiac catheterization report. He reports no chest pain, pressure since last office visit. Stable anginal symptoms with fatigue and activity intolerance - likely due to CTO of RCA. EKG today sinus rhythm no acute changes normal QTC. Increase Ranexa to 1000 mg twice daily. Encourage low-salt diet. Encourage regular cardiovascular activity. He has declined cardiac rehab post PCI.  2. HTN - Blood pressure well controlled today. Continue present antihypertensive regimen.  3. DLD -intolerant of atorvastatin, rosuvastatin with muscle aches. Start Zetia 10 mg daily. Lipids/c-Met in 6 weeks. If LDL remains above 70 may require referral to lipid clinic for PCSK9i and/or bempedoic acid.  4. GERD -reports acid reflux is recurrent. Symptoms are different than his anginal equivalent and not suspicious for recurrent angina. Sensation is of burning in his esophagus. Recommended over-the-counter omeprazole or Nexium. He presently is managing symptoms with over-the-counter Alka-Seltzer.  5. OSA - Compliant with CPAP, encouraged continued compliance.   Disposition: Follow up in 3 month(s) with Dr. Moshe Cipro, NP 09/24/2019, 5:19 PM

## 2019-09-24 NOTE — Addendum Note (Signed)
Addended by: Loel Dubonnet on: 09/24/2019 05:26 PM   Modules accepted: Orders

## 2019-10-05 ENCOUNTER — Telehealth: Payer: Self-pay | Admitting: Cardiology

## 2019-10-05 NOTE — Telephone Encounter (Signed)
°*  STAT* If patient is at the pharmacy, call can be transferred to refill team.   1. Which medications need to be refilled? (please list name of each medication and dose if known) Valsartan   2. Which pharmacy/location (including street and city if local pharmacy) is medication to be sent to? [facility] on [address] in [place]  3. Do they need a 30 day or 90 day supply? [place]

## 2019-10-06 MED ORDER — LOSARTAN POTASSIUM-HCTZ 100-25 MG PO TABS: 1.0000 | ORAL_TABLET | Freq: Every day | ORAL | 1 refills | Status: DC

## 2019-10-06 NOTE — Telephone Encounter (Signed)
Called patient and confirmed that he is not taking valsartan. He needed a refill for losartan- hydrochlorothiazide. Refilled this medication for him.

## 2019-10-26 ENCOUNTER — Other Ambulatory Visit: Payer: Self-pay | Admitting: Cardiology

## 2019-12-13 ENCOUNTER — Other Ambulatory Visit: Payer: Self-pay | Admitting: Cardiology

## 2019-12-23 ENCOUNTER — Other Ambulatory Visit: Payer: Self-pay

## 2019-12-23 ENCOUNTER — Ambulatory Visit (INDEPENDENT_AMBULATORY_CARE_PROVIDER_SITE_OTHER): Payer: Managed Care, Other (non HMO) | Admitting: Cardiology

## 2019-12-23 ENCOUNTER — Encounter: Payer: Self-pay | Admitting: Cardiology

## 2019-12-23 VITALS — BP 144/70 | HR 71 | Ht 65.0 in | Wt 244.3 lb

## 2019-12-23 DIAGNOSIS — G4733 Obstructive sleep apnea (adult) (pediatric): Secondary | ICD-10-CM | POA: Diagnosis not present

## 2019-12-23 DIAGNOSIS — K219 Gastro-esophageal reflux disease without esophagitis: Secondary | ICD-10-CM

## 2019-12-23 DIAGNOSIS — E782 Mixed hyperlipidemia: Secondary | ICD-10-CM | POA: Diagnosis not present

## 2019-12-23 DIAGNOSIS — I2510 Atherosclerotic heart disease of native coronary artery without angina pectoris: Secondary | ICD-10-CM

## 2019-12-23 DIAGNOSIS — I10 Essential (primary) hypertension: Secondary | ICD-10-CM | POA: Diagnosis not present

## 2019-12-23 MED ORDER — TICAGRELOR 90 MG PO TABS: 90.0000 mg | ORAL_TABLET | Freq: Two times a day (BID) | ORAL | 2 refills | Status: DC

## 2019-12-23 NOTE — Patient Instructions (Signed)
Medication Instructions:  Your physician recommends that you continue on your current medications as directed. Please refer to the Current Medication list given to you today.  *If you need a refill on your cardiac medications before your next appointment, please call your pharmacy*  Lab Work: None.  If you have labs (blood work) drawn today and your tests are completely normal, you will receive your results only by: [name] MyChart Message (if you have MyChart) OR . A paper copy in the mail If you have any lab test that is abnormal or we need to change your treatment, we will call you to review the results.  Testing/Procedures: None.   Follow-Up: At [facility], you and your health needs are our priority.  As part of our continuing mission to provide you with exceptional heart care, we have created designated Provider Care Teams.  These Care Teams include your primary Cardiologist (physician) and Advanced Practice Providers (APPs -  Physician Assistants and Nurse Practitioners) who all work together to provide you with the care you need, when you need it.  Your next appointment:   3 month(s)  The format for your next appointment:   In Person  Provider:   You may see [name], MD or the following Advanced Practice Provider on your designated Care Team:    Gillian Shields, FNP   Other Instructions  Dr. Bing Matter has referred you to the lipid clinic they should call you within 1 week if not please call our office

## 2020-01-04 ENCOUNTER — Other Ambulatory Visit: Payer: Self-pay | Admitting: Family

## 2020-01-04 DIAGNOSIS — I2510 Atherosclerotic heart disease of native coronary artery without angina pectoris: Secondary | ICD-10-CM

## 2020-02-01 ENCOUNTER — Ambulatory Visit (INDEPENDENT_AMBULATORY_CARE_PROVIDER_SITE_OTHER): Payer: Managed Care, Other (non HMO) | Admitting: Family Medicine

## 2020-02-01 ENCOUNTER — Encounter: Payer: Self-pay | Admitting: Family Medicine

## 2020-02-01 ENCOUNTER — Other Ambulatory Visit: Payer: Self-pay

## 2020-02-01 DIAGNOSIS — J0100 Acute maxillary sinusitis, unspecified: Secondary | ICD-10-CM | POA: Diagnosis not present

## 2020-02-01 DIAGNOSIS — M79651 Pain in right thigh: Secondary | ICD-10-CM

## 2020-02-01 MED ORDER — AMOXICILLIN-POT CLAVULANATE 875-125 MG PO TABS: 1.0000 | ORAL_TABLET | Freq: Two times a day (BID) | ORAL | 0 refills | Status: AC

## 2020-02-01 MED ORDER — PREDNISONE 20 MG PO TABS: 40.0000 mg | ORAL_TABLET | Freq: Every day | ORAL | 0 refills | Status: AC

## 2020-02-01 NOTE — Progress Notes (Signed)
Musculoskeletal Exam  Patient: Corey [name] DOB: [birth year]  DOS: 02/01/2020  SUBJECTIVE:  Chief Complaint:   Chief Complaint  Patient presents with  . Leg Pain    groin pain right leg     Corey [name] is a 59 y.o.  male for evaluation and treatment of R leg pain. Due to COVID-19 pandemic, we are interacting via web portal for an electronic face-to-face visit. I verified patient's ID using 2 identifiers. Patient agreed to proceed with visit via this method. Patient is at home, I am at office. Patient and I are present for visit.   Onset:  1 week ago. No inj or change in activity.  Location: Inside of R thigh Character:  voltage like  Progression of issue:  has improved Associated symptoms: No bruising, redness, warmth Treatment: to date has been none.   Neurovascular symptoms: no  L sided sinus pain and congestion for a month. Will have bloody d/c. Having watery eyes, drainage. No fevers, ST, sob, cough. Tried allerflo nasal spray that helps a little. +sick contacts w wife and grandkids.  ROS: Musculoskeletal/Extremities: +R leg pain Const: no fevers  Past Medical History:  Diagnosis Date  . Arthritis   . Benign essential HTN 02/10/2016  . GERD (gastroesophageal reflux disease)    occ  . History of kidney stones   . Obesity   . Shortness of breath    occ-allergies  . Sleep apnea    cpap 16 yrs    Objective: No conversational dyspnea Age appropriate judgment and insight Nml affect and mood  Assessment:  Acute maxillary sinusitis, recurrence not specified - Plan: predniSONE (DELTASONE) 20 MG tablet, amoxicillin-clavulanate (AUGMENTIN) 875-125 MG tablet  Right thigh pain  Plan: 1- Pred burst, seems like allergic. Pocket rx w Augmentin. Supportive care. 2- Resolved, watchful waiting.  F/u in 4 mo for CPE or prn. The patient voiced understanding and agreement to the plan.   Jilda Roche [place], DO 02/01/20  5:03 PM

## 2020-02-12 ENCOUNTER — Other Ambulatory Visit: Payer: Self-pay

## 2020-02-12 ENCOUNTER — Ambulatory Visit: Payer: Managed Care, Other (non HMO) | Admitting: Internal Medicine

## 2020-02-12 ENCOUNTER — Encounter: Payer: Self-pay | Admitting: Internal Medicine

## 2020-02-12 VITALS — BP 128/88 | HR 67 | Temp 97.3°F | Ht 66.0 in | Wt 252.0 lb

## 2020-02-12 DIAGNOSIS — I2510 Atherosclerotic heart disease of native coronary artery without angina pectoris: Secondary | ICD-10-CM | POA: Diagnosis not present

## 2020-02-12 DIAGNOSIS — I10 Essential (primary) hypertension: Secondary | ICD-10-CM | POA: Diagnosis not present

## 2020-02-12 DIAGNOSIS — E782 Mixed hyperlipidemia: Secondary | ICD-10-CM

## 2020-02-12 DIAGNOSIS — M791 Myalgia, unspecified site: Secondary | ICD-10-CM

## 2020-02-12 DIAGNOSIS — T466X5A Adverse effect of antihyperlipidemic and antiarteriosclerotic drugs, initial encounter: Secondary | ICD-10-CM

## 2020-02-12 NOTE — Patient Instructions (Signed)
Medication Instructions:  Your physician recommends that you continue on your current medications as directed. Please refer to the Current Medication list given to you today.  *If you need a refill on your cardiac medications before your next appointment, please call your pharmacy*   Lab Work: FASTING lab work to check cholesterol & LP(a)  If you have labs (blood work) drawn today and your tests are completely normal, you will receive your results only by: [name] MyChart Message (if you have MyChart) OR . A paper copy in the mail If you have any lab test that is abnormal or we need to change your treatment, we will call you to review the results.   Testing/Procedures: NONE   Follow-Up: At [facility], you and your health needs are our priority.  As part of our continuing mission to provide you with exceptional heart care, we have created designated Provider Care Teams.  These Care Teams include your primary Cardiologist (physician) and Advanced Practice Providers (APPs -  Physician Assistants and Nurse Practitioners) who all work together to provide you with the care you need, when you need it.  We recommend signing up for the patient portal called "MyChart".  Sign up information is provided on this After Visit Summary.  MyChart is used to connect with patients for Virtual Visits (Telemedicine).  Patients are able to view lab/test results, encounter notes, upcoming appointments, etc.  Non-urgent messages can be sent to your provider as well.   To learn more about what you can do with MyChart, go to [email or web address].    Your next appointment:   4 month(s)  The format for your next appointment:   In Person  Provider:   Dr. Zoila Shutter   Other Instructions

## 2020-02-15 ENCOUNTER — Encounter: Payer: Self-pay | Admitting: Internal Medicine

## 2020-02-15 NOTE — Progress Notes (Signed)
LIPID CLINIC CONSULT NOTE  Chief Complaint:  Manage dyslipidemia  Primary Care Physician: [name], DO  Primary Cardiologist:  [name], MD  HPI:  Corey [name] is a 59 y.o. male who is being seen today for the evaluation of dyslipidemia at the request of Georgeanna Lea, MD.  This is a pleasant 59 year old male kindly referred for evaluation and management of dyslipidemia by Dr. Kirtland Bouchard.  Corey [name] has a history of coronary artery disease with drug-eluting stent to the mid circumflex and a chronic total occlusion of the right coronary artery by cath in July 2020.  He also has hypertension and dyslipidemia.  He has been treated with Ranexa for chronic anginal symptoms.  More recently had a lipid profile which showed total cholesterol of, triglycerides 185, HDL 36 and LDL of 102.  Unfortunately he had myalgias on both atorvastatin and rosuvastatin.  At that time it was recommended he start ezetimibe 10 mg daily however he did not start the medication.  He has not had lipid reassessment since that time.  He reports a variable diet however does have decent amount of saturated fats and there can be an improvement in some fried foods.  He is also listed as taking over-the-counter fish oil as well.  PMHx:  Past Medical History:  Diagnosis Date  . Arthritis   . Benign essential HTN 02/10/2016  . GERD (gastroesophageal reflux disease)    occ  . History of kidney stones   . Obesity   . Shortness of breath    occ-allergies  . Sleep apnea    cpap 16 yrs    Past Surgical History:  Procedure Laterality Date  . ANTERIOR CERVICAL DECOMP/DISCECTOMY FUSION N/A 06/03/2014   Procedure: ANTERIOR CERVICAL DECOMPRESSION FUSION, CERVICAL FIVE-SIX, CERVICAL SIX-SEVEN WITH INSTRUMENTATION AND ALLOGRAFT;  Surgeon: Emilee Hero, MD;  Location: [facility];  Service: Orthopedics;  Laterality: N/A;  . CORONARY STENT INTERVENTION N/A 06/19/2019   Procedure: CORONARY STENT  INTERVENTION;  Surgeon: Swaziland, Peter M, MD;  Location: [facility] INVASIVE CV LAB;  Service: Cardiovascular;  Laterality: N/A;  . I & D EXTREMITY Left 11/10/2018   Procedure: IRRIGATION AND DEBRIDEMENT EXTREMITY, REVISION AMPUTATION OF LEFT RING FINGER;  Surgeon: Mack Hook, MD;  Location: [facility] OR;  Service: Orthopedics;  Laterality: Left;  . LEFT HEART CATH AND CORONARY ANGIOGRAPHY N/A 06/19/2019   Procedure: LEFT HEART CATH AND CORONARY ANGIOGRAPHY;  Surgeon: Swaziland, Peter M, MD;  Location: [facility] INVASIVE CV LAB;  Service: Cardiovascular;  Laterality: N/A;  . URETHRAL DILATION     child    FAMHx:  Family History  Problem Relation Age of Onset  . Arrhythmia Mother        afib  . Heart failure Mother   . CAD Father 13       CABG with redo    SOCHx:   reports that he quit smoking about 7 years ago. His smoking use included cigarettes. He has a 60.00 pack-year smoking history. He has never used smokeless tobacco. He reports that he does not drink alcohol or use drugs.  ALLERGIES:  Allergies  Allergen Reactions  . Atorvastatin Other (See Comments)    Myalgias  . Crestor [Rosuvastatin] Other (See Comments)    Myalgias    ROS: Pertinent items noted in HPI and remainder of comprehensive ROS otherwise negative.  HOME MEDS: Current Outpatient Medications on File Prior to Visit  Medication Sig Dispense Refill  . acetaminophen (TYLENOL) 500 MG tablet Take 1,000  mg by mouth daily.    [name] ALPRAZolam (XANAX) 1 MG tablet Take 0.5-1 mg by mouth at bedtime as needed for anxiety or sleep.     [name] amoxicillin (AMOXIL) 500 MG capsule Take 1,000 mg by mouth 2 (two) times daily.    [name] aspirin EC 81 MG tablet Take 1 tablet (81 mg total) by mouth daily. 90 tablet 3  . Celecoxib (CELEBREX PO) Take 1 tablet by mouth daily.    . celecoxib (CELEBREX) 200 MG capsule Take 200 mg by mouth daily.    . Cholecalciferol (VITAMIN D3) 1.25 MG (50000 UT) TABS Take 5,000 Units by mouth daily.    . cyclobenzaprine  (FLEXERIL) 5 MG tablet Take 5 mg by mouth daily as needed.    [name] losartan-hydrochlorothiazide (HYZAAR) 100-25 MG tablet Take 1 tablet by mouth daily. 90 tablet 1  . Omega-3 Fatty Acids (OMEGA-3 FISH OIL PO) Take 1 tablet by mouth daily.    . ranolazine (RANEXA) 1000 MG SR tablet Take 1 tablet by mouth twice daily 180 tablet 1  . ticagrelor (BRILINTA) 90 MG TABS tablet Take 1 tablet (90 mg total) by mouth 2 (two) times daily. 60 tablet 2  . nitroGLYCERIN (NITROSTAT) 0.4 MG SL tablet Place 1 tablet (0.4 mg total) under the tongue every 5 (five) minutes as needed for chest pain. 30 tablet 2   No current facility-administered medications on file prior to visit.    LABS/IMAGING: No results found for this or any previous visit (from the past 48 hour(s)). No results found.  LIPID PANEL:    Component Value Date/Time   CHOL 170 08/13/2019 1602   TRIG 185 (H) 08/13/2019 1602   HDL 36 (L) 08/13/2019 1602   CHOLHDL 4.7 08/13/2019 1602   LDLCALC 102 (H) 08/13/2019 1602    WEIGHTS: Wt Readings from Last 3 Encounters:  02/12/20 252 lb (114.3 kg)  12/23/19 244 lb 4.8 oz (110.8 kg)  09/24/19 245 lb (111.1 kg)    VITALS: BP 128/88   Pulse 67   Temp (!) 97.3 F (36.3 C)   Ht 5\' 6"  (1.676 m)   Wt 252 lb (114.3 kg)   SpO2 94%   BMI 40.67 kg/m   EXAM: Deferred  EKG: Deferred  ASSESSMENT: 1. Coronary artery disease status post PCI with chronic total occlusion (05/2019) 2. Mixed dyslipidemia, goal LDL less than 70 3. Statin intolerant-myalgias 4. Hypertension  PLAN: 1.   Corey [name] has a mixed dyslipidemia with target LDL less than 70.  Unfortunately he was intolerant to rosuvastatin and atorvastatin.  His LDL 6 months ago was 102 with a target LDL less than 70.  It was advised he could consider ezetimibe however he never started the medication.  While this may get him to goal, its associated with less cardiovascular risk reduction then with a PCSK9 inhibitor which I think is a better  option for him.  I will plan to repeat his lipids including an LP(a) and treat accordingly.   Plan follow-up with repeat lipids in about 3 to 4 months.  Thanks again for the kind referral.  Pixie Casino, MD, FACC, [place] Director of the [facility] Lipid [facility] &  Cardiovascular Risk Reduction Clinic Diplomate of the American Board of Clinical Lipidology Attending Cardiologist  Direct Dial: [phone]  Fax: [phone]  Website:  [email or web address] 02/15/2020, 9:35 AM

## 2020-03-11 NOTE — Progress Notes (Signed)
Cardiology Office Note:    Date:  03/11/2020   ID:  Corey [name], DOB Jul 11, 1961, MRN [id number]  PCP:  Shelda Pal, DO  Cardiologist:  Jenne Campus, MD    Referring MD: Shelda Pal*   Chief Complaint  Patient presents with  . Follow-up    3 MO FU   Doing fine  History of Present Illness:    Corey [name] is a 59 y.o. male   with a hx of  CAD (s/p PTI/DES to mid Cx, CTO of mRCA with robust right to left collaterals by cath 06/19/19), HTN, HLD last seen 08/13/19 by Dr. Agustin Cree.  Cardiac cath ordered for anginal symptoms of chest tightness and dyspnea on exertion. Cath 06/19/19 with DES to Mid Cx noted CTO mRCA with robust R to L collaterals. Recommended for DAPT of Brilinta/Asa for 1 year.  At last office visit transitioned for Atorvastatin to Crestor and added Ranexa. Tells me he was intolerant of Crestor with noted muscle aches in his shoulders and hips. He had a similar reaction with atorvastatin. We discussed LDL goal of less than 70 in setting of CAD and he is agreeable to try Zetia.  Tells me his mother had a cardiac catheterization today and he is awaiting to hear from her on whether she gets to come home tonight or tomorrow. Tells me she had 2 blockages but he is not sure what they did. Told him we would be thinking of his mother and hope she does well.  He reports indigestion that feels like burning in his esophagus. Tells me he has had this previously and it was resolved with PPI. He has been taking Alka-Seltzer's with good result. We discussed initiation of over-the-counter PPI such as Nexium or Prilosec.  Reports no recurrent chest pain. Reports no shortness of breath at rest. Reports very mild lower extremity edema after standing on his feet all day at work. We discussed etiology of venous insufficiency and recommended watching sodium intake, elevating lower extremities, compression stockings.  He was offered cardiac rehab while in  the hospital but declines and tells me he walks a lot at work and feels that this is adequate exercise. Encouraged to start regular exercise regimen. Tells me he still feels like he gets tired throughout the day. Tells me this is a little bit better than before his cardiac catheterization. Tells me since the initiation of Ranexa his chest does not feel heavy. We discussed up titration of his Ranexa and he is agreeable.  Comes today to have follow-up doing well.  Denies any chest pain tightness squeezing pressure burning chest.  Past Medical History:  Diagnosis Date  . Arthritis   . Benign essential HTN 02/10/2016  . GERD (gastroesophageal reflux disease)    occ  . History of kidney stones   . Obesity   . Shortness of breath    occ-allergies  . Sleep apnea    cpap 16 yrs    Past Surgical History:  Procedure Laterality Date  . ANTERIOR CERVICAL DECOMP/DISCECTOMY FUSION N/A 06/03/2014   Procedure: ANTERIOR CERVICAL DECOMPRESSION FUSION, CERVICAL FIVE-SIX, CERVICAL SIX-SEVEN WITH INSTRUMENTATION AND ALLOGRAFT;  Surgeon: Sinclair Ship, MD;  Location: [facility];  Service: Orthopedics;  Laterality: N/A;  . CORONARY STENT INTERVENTION N/A 06/19/2019   Procedure: CORONARY STENT INTERVENTION;  Surgeon: Martinique, Peter M, MD;  Location: [facility];  Service: Cardiovascular;  Laterality: N/A;  . I & D EXTREMITY Left 11/10/2018   Procedure: IRRIGATION AND DEBRIDEMENT  EXTREMITY, REVISION AMPUTATION OF LEFT RING FINGER;  Surgeon: Mack Hook, MD;  Location: [facility] OR;  Service: Orthopedics;  Laterality: Left;  . LEFT HEART CATH AND CORONARY ANGIOGRAPHY N/A 06/19/2019   Procedure: LEFT HEART CATH AND CORONARY ANGIOGRAPHY;  Surgeon: Swaziland, Peter M, MD;  Location: [facility] INVASIVE [facility] LAB;  Service: Cardiovascular;  Laterality: N/A;  . URETHRAL DILATION     child    Current Medications: Current Meds  Medication Sig  . acetaminophen (TYLENOL) 500 MG tablet Take 1,000 mg by mouth daily.  [name] ALPRAZolam  (XANAX) 1 MG tablet Take 0.5-1 mg by mouth at bedtime as needed for anxiety or sleep.   [name] aspirin EC 81 MG tablet Take 1 tablet (81 mg total) by mouth daily.  . Celecoxib (CELEBREX PO) Take 1 tablet by mouth daily.  . Cholecalciferol (VITAMIN D3) 1.25 MG (50000 UT) TABS Take 5,000 Units by mouth daily.  [name] losartan-hydrochlorothiazide (HYZAAR) 100-25 MG tablet Take 1 tablet by mouth daily.  . Omega-3 Fatty Acids (OMEGA-3 FISH OIL PO) Take 1 tablet by mouth daily.  . ticagrelor (BRILINTA) 90 MG TABS tablet Take 1 tablet (90 mg total) by mouth 2 (two) times daily.  . [DISCONTINUED] BRILINTA 90 MG TABS tablet Take 1 tablet by mouth twice daily  . [DISCONTINUED] ranolazine (RANEXA) 1000 MG SR tablet Take 1 tablet (1,000 mg total) by mouth 2 (two) times daily.     Allergies:   Atorvastatin and Crestor [rosuvastatin]   Social History   Socioeconomic History  . Marital status: Married    Spouse name: Not on file  . Number of children: Not on file  . Years of education: Not on file  . Highest education level: Not on file  Occupational History  . Not on file  Tobacco Use  . Smoking status: Former Smoker    Packs/day: 2.00    Years: 30.00    Pack years: 60.00    Types: Cigarettes    Quit date: 05/24/2012    Years since quitting: 7.8  . Smokeless tobacco: Never Used  Substance and Sexual Activity  . Alcohol use: No  . Drug use: No  . Sexual activity: Not on file  Other Topics Concern  . Not on file  Social History Narrative  . Not on file   Social Determinants of Health   Financial Resource Strain:   . Difficulty of Paying Living Expenses:   Food Insecurity:   . Worried About Programme researcher, broadcasting/film/video in the Last Year:   . Barista in the Last Year:   Transportation Needs:   . Freight forwarder (Medical):   [name] Lack of Transportation (Non-Medical):   Physical Activity:   . Days of Exercise per Week:   . Minutes of Exercise per Session:   Stress:   . Feeling of Stress :     Social Connections:   . Frequency of Communication with Friends and Family:   . Frequency of Social Gatherings with Friends and Family:   . Attends Religious Services:   . Active Member of Clubs or Organizations:   . Attends Banker Meetings:   [name] Marital Status:      Family History: The patient's family history includes Arrhythmia in his mother; CAD (age of onset: 7) in his father; Heart failure in his mother. ROS:   Please see the history of present illness.    All 14 point review of systems negative except as described per history of present illness  EKGs/Labs/Other Studies Reviewed:      Recent Labs: 06/15/2019: BUN 15; Creatinine, Ser 0.89; Hemoglobin 14.8; Platelets 278; Potassium 4.2; Sodium 138  Recent Lipid Panel    Component Value Date/Time   CHOL 170 08/13/2019 1602   TRIG 185 (H) 08/13/2019 1602   HDL 36 (L) 08/13/2019 1602   CHOLHDL 4.7 08/13/2019 1602   LDLCALC 102 (H) 08/13/2019 1602    Physical Exam:    VS:  BP (!) 144/70   Pulse 71   Ht 5\' 5"  (1.651 m)   Wt 244 lb 4.8 oz (110.8 kg)   SpO2 97%   BMI 40.65 kg/m     Wt Readings from Last 3 Encounters:  02/12/20 252 lb (114.3 kg)  12/23/19 244 lb 4.8 oz (110.8 kg)  09/24/19 245 lb (111.1 kg)     GEN:  Well nourished, well developed in no acute distress HEENT: Normal NECK: No JVD; No carotid bruits LYMPHATICS: No lymphadenopathy CARDIAC: RRR, no murmurs, no rubs, no gallops RESPIRATORY:  Clear to auscultation without rales, wheezing or rhonchi  ABDOMEN: Soft, non-tender, non-distended MUSCULOSKELETAL:  No edema; No deformity  SKIN: Warm and dry LOWER EXTREMITIES: no swelling NEUROLOGIC:  Alert and oriented x 3 PSYCHIATRIC:  Normal affect   ASSESSMENT:    1. Mixed hyperlipidemia   2. Coronary artery disease involving native coronary artery of native heart without angina pectoris   3. Benign essential HTN   4. OSA (obstructive sleep apnea)   5. Gastroesophageal reflux disease  without esophagitis    PLAN:    In order of problems listed above:  1. Mixed dyslipidemia.  Still not at target.  He was referred to lipid clinic. 2. Coronary artery disease, stable on appropriate medication which I will continue 3.  4. Essential hypertension blood pressure well controlled continue present management. 5. Obstructive sleep apnea: Managed by internal medicine team 6. GERD: Doing well   Medication Adjustments/Labs and Tests Ordered: Current medicines are reviewed at length with the patient today.  Concerns regarding medicines are outlined above.  Orders Placed This Encounter  Procedures  . AMB Referral to [facility]   Medication changes:  Meds ordered this encounter  Medications  . ticagrelor (BRILINTA) 90 MG TABS tablet    Sig: Take 1 tablet (90 mg total) by mouth 2 (two) times daily.    Dispense:  60 tablet    Refill:  2    Signed, 09/26/19, MD, [facility] 12/22/2010  3:17 PM    [facility]

## 2020-03-18 ENCOUNTER — Encounter: Payer: Self-pay | Admitting: Cardiology

## 2020-03-18 ENCOUNTER — Other Ambulatory Visit: Payer: Self-pay

## 2020-03-18 ENCOUNTER — Ambulatory Visit: Payer: Managed Care, Other (non HMO) | Admitting: Cardiology

## 2020-03-18 VITALS — BP 122/82 | HR 68 | Ht 66.0 in | Wt 248.0 lb

## 2020-03-18 DIAGNOSIS — G959 Disease of spinal cord, unspecified: Secondary | ICD-10-CM

## 2020-03-18 DIAGNOSIS — I2510 Atherosclerotic heart disease of native coronary artery without angina pectoris: Secondary | ICD-10-CM

## 2020-03-18 DIAGNOSIS — G4733 Obstructive sleep apnea (adult) (pediatric): Secondary | ICD-10-CM

## 2020-03-18 DIAGNOSIS — E782 Mixed hyperlipidemia: Secondary | ICD-10-CM | POA: Diagnosis not present

## 2020-03-18 DIAGNOSIS — I10 Essential (primary) hypertension: Secondary | ICD-10-CM

## 2020-03-18 NOTE — Progress Notes (Signed)
Cardiology Office Note:    Date:  03/18/2020   ID:  Corey [name], DOB Sep 22, 1961, MRN [id number]  PCP:  Shelda Pal, DO  Cardiologist:  Jenne Campus, MD    Referring MD: Shelda Pal*   Chief Complaint  Patient presents with  . Follow-up    3 Months  I am doing fine but to have some shortness of breath  History of Present Illness:    Corey [name] is a 59 y.o. male with past medical history significant for coronary artery disease, status post PTCA and drug-eluting stent to circumflex artery in July 2020.  Comes today to my office for follow-up overall seems to be doing well complain of having shortness of breath and this is sensation that happened since the time of his angioplasty.  We had a long discussion about this and potentially could be related to [place].  I told him about the fact that we can potentially change Brilinta to Plavix, however, he preferred to continue Brilinta.  Denies having any tightness squeezing pressure burning chest, no swelling of lower extremities.  Otherwise seems to be doing well.  Past Medical History:  Diagnosis Date  . Arthritis   . Benign essential HTN 02/10/2016  . GERD (gastroesophageal reflux disease)    occ  . History of kidney stones   . Obesity   . Shortness of breath    occ-allergies  . Sleep apnea    cpap 16 yrs    Past Surgical History:  Procedure Laterality Date  . ANTERIOR CERVICAL DECOMP/DISCECTOMY FUSION N/A 06/03/2014   Procedure: ANTERIOR CERVICAL DECOMPRESSION FUSION, CERVICAL FIVE-SIX, CERVICAL SIX-SEVEN WITH INSTRUMENTATION AND ALLOGRAFT;  Surgeon: Sinclair Ship, MD;  Location: [facility];  Service: Orthopedics;  Laterality: N/A;  . CORONARY STENT INTERVENTION N/A 06/19/2019   Procedure: CORONARY STENT INTERVENTION;  Surgeon: Martinique, Peter M, MD;  Location: [place] [facility];  Service: Cardiovascular;  Laterality: N/A;  . I & D EXTREMITY Left 11/10/2018   Procedure: IRRIGATION AND  DEBRIDEMENT EXTREMITY, REVISION AMPUTATION OF LEFT RING FINGER;  Surgeon: Milly Jakob, MD;  Location: [facility];  Service: Orthopedics;  Laterality: Left;  . LEFT HEART CATH AND CORONARY ANGIOGRAPHY N/A 06/19/2019   Procedure: LEFT HEART CATH AND CORONARY ANGIOGRAPHY;  Surgeon: Martinique, Peter M, MD;  Location: [place] [facility];  Service: Cardiovascular;  Laterality: N/A;  . URETHRAL DILATION     child    Current Medications: Current Meds  Medication Sig  . acetaminophen (TYLENOL) 500 MG tablet Take 1,000 mg by mouth daily.  [name] ALPRAZolam (XANAX) 1 MG tablet Take 0.5-1 mg by mouth at bedtime as needed for anxiety or sleep.   [name] aspirin EC 81 MG tablet Take 1 tablet (81 mg total) by mouth daily.  . Celecoxib (CELEBREX PO) Take 1 tablet by mouth daily.  . celecoxib (CELEBREX) 200 MG capsule Take 200 mg by mouth daily.  . Cholecalciferol (VITAMIN D3) 1.25 MG (50000 UT) TABS Take 5,000 Units by mouth daily.  . cyclobenzaprine (FLEXERIL) 5 MG tablet Take 5 mg by mouth daily as needed.  [name] losartan-hydrochlorothiazide (HYZAAR) 100-25 MG tablet Take 1 tablet by mouth daily.  . nitroGLYCERIN (NITROSTAT) 0.4 MG SL tablet Place 1 tablet (0.4 mg total) under the tongue every 5 (five) minutes as needed for chest pain.  . Omega-3 Fatty Acids (OMEGA-3 FISH OIL PO) Take 1 tablet by mouth daily.  . ranolazine (RANEXA) 1000 MG SR tablet Take 1 tablet by mouth twice daily  .  ticagrelor (BRILINTA) 90 MG TABS tablet Take 1 tablet (90 mg total) by mouth 2 (two) times daily.     Allergies:   Atorvastatin and Crestor [rosuvastatin]   Social History   Socioeconomic History  . Marital status: Married    Spouse name: Not on file  . Number of children: Not on file  . Years of education: Not on file  . Highest education level: Not on file  Occupational History  . Not on file  Tobacco Use  . Smoking status: Former Smoker    Packs/day: 2.00    Years: 30.00    Pack years: 60.00    Types: Cigarettes    Quit  date: 05/24/2012    Years since quitting: 7.8  . Smokeless tobacco: Never Used  Substance and Sexual Activity  . Alcohol use: No  . Drug use: No  . Sexual activity: Not on file  Other Topics Concern  . Not on file  Social History Narrative  . Not on file   Social Determinants of Health   Financial Resource Strain:   . Difficulty of Paying Living Expenses:   Food Insecurity:   . Worried About Programme researcher, broadcasting/film/video in the Last Year:   . Barista in the Last Year:   Transportation Needs:   . Freight forwarder (Medical):   [name] Lack of Transportation (Non-Medical):   Physical Activity:   . Days of Exercise per Week:   . Minutes of Exercise per Session:   Stress:   . Feeling of Stress :   Social Connections:   . Frequency of Communication with Friends and Family:   . Frequency of Social Gatherings with Friends and Family:   . Attends Religious Services:   . Active Member of Clubs or Organizations:   . Attends Banker Meetings:   [name] Marital Status:      Family History: The patient's family history includes Arrhythmia in his mother; CAD (age of onset: 8) in his father; Heart failure in his mother. ROS:   Please see the history of present illness.    All 14 point review of systems negative except as described per history of present illness  EKGs/Labs/Other Studies Reviewed:      Recent Labs: 06/15/2019: BUN 15; Creatinine, Ser 0.89; Hemoglobin 14.8; Platelets 278; Potassium 4.2; Sodium 138  Recent Lipid Panel    Component Value Date/Time   CHOL 170 08/13/2019 1602   TRIG 185 (H) 08/13/2019 1602   HDL 36 (L) 08/13/2019 1602   CHOLHDL 4.7 08/13/2019 1602   LDLCALC 102 (H) 08/13/2019 1602    Physical Exam:    VS:  BP 122/82   Pulse 68   Ht 5\' 6"  (1.676 m)   Wt 248 lb (112.5 kg)   SpO2 95%   BMI 40.03 kg/m     Wt Readings from Last 3 Encounters:  03/18/20 248 lb (112.5 kg)  02/12/20 252 lb (114.3 kg)  12/23/19 244 lb 4.8 oz (110.8 kg)      GEN:  Well nourished, well developed in no acute distress HEENT: Normal NECK: No JVD; No carotid bruits LYMPHATICS: No lymphadenopathy CARDIAC: RRR, no murmurs, no rubs, no gallops RESPIRATORY:  Clear to auscultation without rales, wheezing or rhonchi  ABDOMEN: Soft, non-tender, non-distended MUSCULOSKELETAL:  No edema; No deformity  SKIN: Warm and dry LOWER EXTREMITIES: no swelling NEUROLOGIC:  Alert and oriented x 3 PSYCHIATRIC:  Normal affect   ASSESSMENT:    1. Mixed hyperlipidemia  2. Coronary artery disease involving native coronary artery of native heart without angina pectoris   3. Myelopathy (HCC)   4. Benign essential HTN   5. OSA (obstructive sleep apnea)    PLAN:    In order of problems listed above:  1. Mixed dyslipidemia.  I will schedule him to have fasting lipid profile done as well as LP(a), he is already enrolled in our lipid clinic and plan is to put him on PCSK9 agent 2. Coronary disease: Stable on appropriate medication we will continue dual antiplatelets therapy at least until summer of this year.  He may require longer therapy with dual antiplatelets agent but investigate that will be Plavix.  Indication will IIb 3. Benign essential hypertension.  His blood pressure is controlled we will continue present management. 4. Obstructive sleep apnea that being followed by internal medicine team   Medication Adjustments/Labs and Tests Ordered: Current medicines are reviewed at length with the patient today.  Concerns regarding medicines are outlined above.  Orders Placed This Encounter  Procedures  . Lipoprotein A (LPA)  . Lipid Profile  . Direct LDL  . EKG 12-Lead   Medication changes: No orders of the defined types were placed in this encounter.   Signed, Georgeanna Lea, MD, [facility] 03/18/2020 4:45 PM    [facility]

## 2020-03-18 NOTE — Patient Instructions (Signed)
Medication Instructions:  Your physician recommends that you continue on your current medications as directed. Please refer to the Current Medication list given to you today.   Labwork: Your physician recommends that you have lab work today: Lipoprotein/Direct LDL/Lipid   Testing/Procedures: -None  Follow-Up: Your physician recommends that you keep your scheduled  follow-up appointment with Dr. Janyce Llanos in August.    Any Other Special Instructions Will Be Listed Below (If Applicable).     If you need a refill on your cardiac medications before your next appointment, please call your pharmacy.

## 2020-03-20 LAB — LIPOPROTEIN A (LPA): Lipoprotein (a): 110.8 nmol/L — ABNORMAL HIGH (ref <75.0)

## 2020-03-20 LAB — LIPID PANEL
Chol/HDL Ratio: 4.2 ratio (ref 0.0–5.0)
Cholesterol, Total: 176 mg/dL (ref 100–199)
HDL: 42 mg/dL (ref >39)
LDL Chol Calc (NIH): 96 mg/dL (ref 0–99)
Triglycerides: 221 mg/dL — ABNORMAL HIGH (ref 0–149)
VLDL Cholesterol Cal: 38 mg/dL (ref 5–40)

## 2020-03-20 LAB — LDL CHOLESTEROL, DIRECT: LDL Direct: 108 mg/dL — ABNORMAL HIGH (ref 0–99)

## 2020-03-23 ENCOUNTER — Telehealth: Payer: Self-pay | Admitting: Emergency Medicine

## 2020-03-23 MED ORDER — FUROSEMIDE 20 MG PO TABS: 20.0000 mg | ORAL_TABLET | Freq: Every day | ORAL | 1 refills | Status: DC

## 2020-03-23 NOTE — Telephone Encounter (Signed)
Patient informed of lab results and Dr. Vanetta Shawl recommendation to start lasix 20 mg daily.Pateint verbally understood no further questions.

## 2020-03-28 ENCOUNTER — Encounter: Payer: Self-pay | Admitting: Family Medicine

## 2020-03-28 ENCOUNTER — Ambulatory Visit: Payer: Managed Care, Other (non HMO) | Admitting: Family Medicine

## 2020-03-28 ENCOUNTER — Other Ambulatory Visit: Payer: Self-pay

## 2020-03-28 VITALS — BP 120/80 | HR 75 | Temp 95.7°F | Ht 66.0 in | Wt 248.0 lb

## 2020-03-28 DIAGNOSIS — S20469A Insect bite (nonvenomous) of unspecified back wall of thorax, initial encounter: Secondary | ICD-10-CM | POA: Diagnosis not present

## 2020-03-28 DIAGNOSIS — W57XXXA Bitten or stung by nonvenomous insect and other nonvenomous arthropods, initial encounter: Secondary | ICD-10-CM | POA: Diagnosis not present

## 2020-03-28 MED ORDER — DOXYCYCLINE HYCLATE 100 MG PO TABS: 200.0000 mg | ORAL_TABLET | Freq: Once | ORAL | 0 refills | Status: AC

## 2020-03-28 NOTE — Progress Notes (Signed)
Chief Complaint  Patient presents with  . Insect Bite    ticks    Corey [name] is a 59 y.o. male here for a skin complaint.  Duration: 3 days Location: back Pruritic? Yes Painful? Yes Drainage? No Other associated symptoms: 2 ticks bit him last week, wife removed both Therapies tried thus far: itch spray Denies fevers.   Past Medical History:  Diagnosis Date  . Arthritis   . Benign essential HTN 02/10/2016  . GERD (gastroesophageal reflux disease)    occ  . History of kidney stones   . Obesity   . Shortness of breath    occ-allergies  . Sleep apnea    cpap 16 yrs    BP 120/80 (BP Location: Left Arm, Patient Position: Sitting, Cuff Size: Normal)   Pulse 75   Temp (!) 95.7 F (35.4 C) (Temporal)   Ht 5\' 6"  (1.676 m)   Wt 248 lb (112.5 kg)   SpO2 94%   BMI 40.03 kg/m  Gen: awake, alert, appearing stated age Lungs: No accessory muscle use Skin: See below. No drainage, TTP, fluctuance Psych: Age appropriate judgment and insight   R lower mid back   R Upper back  Tick bite, initial encounter - Plan: doxycycline (VIBRA-TABS) 100 MG tablet  No bullseye rash or drainage/fluctuance. Empirically tx w doxy 200 mg x 1. Warning signs and symptoms verbalized and written down in AVS. HC cream for itching.  F/u prn. The patient voiced understanding and agreement to the plan.  [place], DO 03/28/20 11:22 AM

## 2020-03-28 NOTE — Patient Instructions (Signed)
Use sun protection.   Things to look out for: increasing pain not relieved by ibuprofen/acetaminophen, fevers, spreading redness, drainage of pus, or foul odor.  Consider using over the counter steroid cream (hydrocortisone) for itching.   Let us know if you need anything.

## 2020-05-03 ENCOUNTER — Other Ambulatory Visit: Payer: Self-pay | Admitting: Cardiology

## 2020-05-03 DIAGNOSIS — I2510 Atherosclerotic heart disease of native coronary artery without angina pectoris: Secondary | ICD-10-CM

## 2020-06-06 ENCOUNTER — Ambulatory Visit: Payer: Managed Care, Other (non HMO) | Admitting: Family Medicine

## 2020-06-06 ENCOUNTER — Encounter: Payer: Self-pay | Admitting: Family Medicine

## 2020-06-06 ENCOUNTER — Other Ambulatory Visit: Payer: Self-pay

## 2020-06-06 VITALS — BP 110/72 | HR 92 | Temp 98.1°F | Ht 65.5 in | Wt 245.0 lb

## 2020-06-06 DIAGNOSIS — M79671 Pain in right foot: Secondary | ICD-10-CM

## 2020-06-06 DIAGNOSIS — F411 Generalized anxiety disorder: Secondary | ICD-10-CM | POA: Diagnosis not present

## 2020-06-06 DIAGNOSIS — G4733 Obstructive sleep apnea (adult) (pediatric): Secondary | ICD-10-CM

## 2020-06-06 MED ORDER — FLUOXETINE HCL 20 MG PO TABS: ORAL_TABLET | ORAL | 3 refills | Status: DC

## 2020-06-06 MED ORDER — ALPRAZOLAM 1 MG PO TABS: 0.5000 mg | ORAL_TABLET | Freq: Every evening | ORAL | 2 refills | Status: DC | PRN

## 2020-06-06 NOTE — Progress Notes (Signed)
Musculoskeletal Exam  Patient: Corey [name] DOB: 1961/03/16  DOS: 06/06/2020  SUBJECTIVE:  Chief Complaint:   Chief Complaint  Patient presents with  . Foot Pain    right  . Medication Refill    alprazolam  . Follow-up    CPAP    Corey [name] is a 59 y.o.  male for evaluation and treatment of R foot pain.   Onset:  1 year ago. No inj or change in activity.  Location: R outer foot, on top Character:  aching  Progression of issue:  is unchanged Associated symptoms: limping,  Some swelling, no bruising, redness Treatment: to date has been: ankle braces, bought new shoes, Biofreeze.    Neurovascular symptoms: no  GAD Hx of GAD, taking Xanax 1-2x/d. Has been on this for 20 years. Gets agitated and high strung and takes it. Uses it to help him sleep as well. Does not follow w a counselor or psychologist. No SI or HI. He failed Lexapro.    Patient has a history of obstructive sleep apnea on CPAP.  He does not follow with a sleep specialist.  He questions whether he needs to have his CPAP adjusted.  Past Medical History:  Diagnosis Date  . Arthritis   . Benign essential HTN 02/10/2016  . GERD (gastroesophageal reflux disease)    occ  . History of kidney stones   . Obesity   . Shortness of breath    occ-allergies  . Sleep apnea    cpap 16 yrs    Objective: VITAL SIGNS: BP 110/72 (BP Location: Right Arm, Patient Position: Sitting, Cuff Size: Normal)   Pulse 92   Temp 98.1 F (36.7 C) (Oral)   Ht 5' 5.5" (1.664 m)   Wt 245 lb (111.1 kg)   SpO2 98%   BMI 40.15 kg/m  Constitutional: Well formed, well developed. No acute distress. Thorax & Lungs: No accessory muscle use Musculoskeletal: R foot.   Tenderness to palpation: mild ttp over the dorsal toe extensors Deformity: no Ecchymosis: no Tests positive: none Tests negative: squeeze Neurologic: Normal sensory function. No focal deficits noted. DTR's equal and symmetric in LE's. No clonus. Psychiatric: Normal  mood. Age appropriate judgment and insight. Alert & oriented x 3.    Assessment:  GAD (generalized anxiety disorder) - Plan: ALPRAZolam (XANAX) 1 MG tablet, FLUoxetine (PROZAC) 20 MG tablet  OSA (obstructive sleep apnea) - Plan: Ambulatory referral to Pulmonology  Right foot pain  Plan: 1.  Start Prozac.  I will continue Xanax for the time being.  Once he retires he might not need it anymore.  Counseling information provided.  I will see him in 6 weeks. 2.  Refer to pulmonology 3.  Seems to be more tendon related.  Stretches/exercises, heat, ice, Tylenol.  Could consider physical therapy or sports med referral if no improvement. The patient voiced understanding and agreement to the plan.   Corey Roche [place], DO 06/06/20  4:43 PM

## 2020-06-06 NOTE — Patient Instructions (Addendum)
If you do not hear anything about your referral in the next 1-2 weeks, call our office and ask for an update.  Ice/cold pack over area for 10-15 min twice daily.  Heat (pad or rice pillow in microwave) over affected area, 10-15 minutes twice daily.   Please consider counseling. Contact [phone] to schedule an appointment or inquire about cost/insurance coverage.  Let us know if you need anything.  Ankle Exercises It is normal to feel mild stretching, pulling, tightness, or discomfort as you do these exercises, but you should stop right away if you feel sudden pain or your pain gets worse.  Stretching and range of motion exercises These exercises warm up your muscles and joints and improve the movement and flexibility of your ankle. These exercises also help to relieve pain, numbness, and tingling.  Exercise A: Dorsiflexion/Plantar Flexion   1. Sit with your affected knee straight or bent. Do not rest your foot on anything. 2. Flex your affected ankle to tilt the top of your foot toward your shin. 3. Hold this position for 5 seconds. 4. Point your toes downward to tilt the top of your foot away from your shin. 5. Hold this position for 5 seconds. Repeat 2 times. Complete this exercise 3 times per week. Exercise B: Ankle Alphabet   1. Sit with your affected foot supported at your lower leg. ? Do not rest your foot on anything. ? Make sure your foot has room to move freely. 2. Think of your affected foot as a paintbrush, and move your foot to trace each letter of the alphabet in the air. Keep your hip and knee still while you trace. Make the letters as large as you can without increasing any discomfort. 3. Trace every letter from A to Z. Repeat 2 times. Complete this exercise 3 times per week. Strengthening exercises These exercises build strength and endurance in your ankle. Endurance is the ability to use your muscles for a long time, even after they get tired. Exercise D:  Dorsiflexors   1. Secure a rubber exercise band or tube to an object, such as a table leg, that will stay still when the band is pulled. Secure the other end around your affected foot. 2. Sit on the floor, facing the object with your affected leg extended. The band or tube should be slightly tense when your foot is relaxed. 3. Slowly flex your affected ankle and toes to bring your foot toward you. 4. Hold this position for 3 seconds.  5. Slowly return your foot to the starting position, controlling the band as you do that. Do a total of 10 repetitions. Repeat 2 times. Complete this exercise 3 times per week. Exercise E: Plantar Flexors   1. Sit on the floor with your affected leg extended. 2. Loop a rubber exercise band or tube around the ball of your affected foot. The ball of your foot is on the walking surface, right under your toes. The band or tube should be slightly tense when your foot is relaxed. 3. Slowly point your toes downward, pushing them away from you. 4. Hold this position for 3 seconds. 5. Slowly release the tension in the band or tube, controlling smoothly until your foot is back in the starting position. Repeat for a total of 10 repetitions. Repeat 2 times. Complete this exercise 3 times per week. Exercise F: Towel Curls   1. Sit in a chair on a non-carpeted surface, and put your feet on the floor. 2. Place  a towel in front of your feet.  3. Keeping your heel on the floor, put your affected foot on the towel. 4. Pull the towel toward you by grabbing the towel with your toes and curling them under. Keep your heel on the floor. 5. Let your toes relax. 6. Grab the towel again. Keep going until the towel is completely underneath your foot. Repeat for a total of 10 repetitions. Repeat 2 times. Complete this exercise 3 times per week. Exercise G: Heel Raise ( Plantar Flexors, Standing)    1. Stand with your feet shoulder-width apart. 2. Keep your weight spread evenly  over the width of your feet while you rise up on your toes. Use a wall or table to steady yourself, but try not to use it for support. 3. If this exercise is too easy, try these options: ? Shift your weight toward your affected leg until you feel challenged. ? If told by your health care provider, lift your uninjured leg off the floor. 4. Hold this position for 3 seconds. Repeat for a total of 10 repetitions. Repeat 2 times. Complete this exercise 3 times per week. Exercise H: Tandem Walking 1. Stand with one foot directly in front of the other. 2. Slowly raise your back foot up, lifting your heel before your toes, and place it directly in front of your other foot. 3. Continue to walk in this heel-to-toe way for 10 steps or for as long as told by your health care provider. Have a countertop or wall nearby to use if needed to keep your balance, but try not to hold onto anything for support. Repeat 2 times. Complete this exercises 3 times per week. Make sure you discuss any questions you have with your health care provider. Document Released: 09/26/2005 Document Revised: 07/12/2016 Document Reviewed: 07/31/2015 Elsevier Interactive Patient Education  2018 [employer].

## 2020-06-27 ENCOUNTER — Emergency Department (HOSPITAL_BASED_OUTPATIENT_CLINIC_OR_DEPARTMENT_OTHER)
Admission: EM | Admit: 2020-06-27 | Discharge: 2020-06-27 | Disposition: A | Discharge status: Home / Self Care | Payer: Managed Care, Other (non HMO) | Attending: Emergency Medicine | Admitting: Emergency Medicine

## 2020-06-27 ENCOUNTER — Other Ambulatory Visit: Payer: Self-pay

## 2020-06-27 DIAGNOSIS — R0602 Shortness of breath: Secondary | ICD-10-CM | POA: Insufficient documentation

## 2020-06-27 DIAGNOSIS — Z5321 Procedure and treatment not carried out due to patient leaving prior to being seen by health care provider: Secondary | ICD-10-CM | POA: Diagnosis not present

## 2020-06-30 ENCOUNTER — Ambulatory Visit: Payer: Managed Care, Other (non HMO) | Admitting: Cardiology

## 2020-07-04 ENCOUNTER — Encounter: Payer: Self-pay | Admitting: Family Medicine

## 2020-07-04 ENCOUNTER — Telehealth (INDEPENDENT_AMBULATORY_CARE_PROVIDER_SITE_OTHER): Payer: Managed Care, Other (non HMO) | Admitting: Family Medicine

## 2020-07-04 ENCOUNTER — Other Ambulatory Visit: Payer: Self-pay

## 2020-07-04 ENCOUNTER — Other Ambulatory Visit: Payer: Self-pay | Admitting: Cardiology

## 2020-07-04 VITALS — Ht 65.0 in | Wt 226.0 lb

## 2020-07-04 DIAGNOSIS — I2510 Atherosclerotic heart disease of native coronary artery without angina pectoris: Secondary | ICD-10-CM

## 2020-07-04 DIAGNOSIS — U071 COVID-19: Secondary | ICD-10-CM | POA: Diagnosis not present

## 2020-07-04 MED ORDER — PREDNISONE 20 MG PO TABS: 40.0000 mg | ORAL_TABLET | Freq: Every day | ORAL | 0 refills | Status: AC

## 2020-07-04 NOTE — Progress Notes (Signed)
Chief Complaint  Patient presents with  . Follow-up    still having Covid symptoms  . Follow-up    disability paperwork    Subjective: Patient is a 59 y.o. male here for f/u. Due to COVID-19 pandemic, we are interacting via web portal for an electronic face-to-face visit. I verified patient's ID using 2 identifiers. Patient agreed to proceed with visit via this method. Patient is at home, I am at office. Patient and I are present for visit.   Patient has been diagnosed with Covid.  He still having shortness of breath with exertion.  He is trying to walk nightly to build back his strength.  His work is physical and has not been humidity.  He is not ready to return is requesting short-term disability.  He also endorses pain in his lower extremities, hips, and mid back area.  He does not routinely stretch.  No specific injury or change in activity.  Pain seems to be exertional.  Past Medical History:  Diagnosis Date  . Arthritis   . Benign essential HTN 02/10/2016  . GERD (gastroesophageal reflux disease)    occ  . History of kidney stones   . Obesity   . Shortness of breath    occ-allergies  . Sleep apnea    cpap 16 yrs    Objective: Ht 5\' 5"  (1.651 m)   Wt 226 lb (102.5 kg)   BMI 37.61 kg/m  No conversational dyspnea Age appropriate judgment and insight Nml affect and mood  Assessment and Plan: COVID-19 - Plan: predniSONE (DELTASONE) 20 MG tablet  5-day prednisone burst, 40 mg daily. Hopefully the steroid helps with any lung inflammation and with his musculoskeletal complaints.  I would like to schema the office for the latter if no improvement.  I would consider osteoarthritis and peripheral arterial disease. I will write him out for 1 month for short-term disability as he will likely have to care for his critically ill wife when she gets out of the hospital. The patient voiced understanding and agreement to the plan.  [place], DO 07/04/20  3:19  PM

## 2020-07-06 ENCOUNTER — Other Ambulatory Visit: Payer: Self-pay | Admitting: Family Medicine

## 2020-07-06 DIAGNOSIS — R069 Unspecified abnormalities of breathing: Secondary | ICD-10-CM

## 2020-07-06 DIAGNOSIS — U071 COVID-19: Secondary | ICD-10-CM

## 2020-07-23 ENCOUNTER — Other Ambulatory Visit: Payer: Self-pay | Admitting: Cardiology

## 2020-07-25 ENCOUNTER — Other Ambulatory Visit: Payer: Self-pay | Admitting: Physician Assistant

## 2020-07-25 MED ORDER — LOSARTAN POTASSIUM-HCTZ 100-25 MG PO TABS: 1.0000 | ORAL_TABLET | Freq: Every day | ORAL | 2 refills | Status: DC

## 2020-07-26 ENCOUNTER — Ambulatory Visit (INDEPENDENT_AMBULATORY_CARE_PROVIDER_SITE_OTHER): Payer: Managed Care, Other (non HMO) | Admitting: Family Medicine

## 2020-07-26 ENCOUNTER — Ambulatory Visit: Payer: Managed Care, Other (non HMO) | Admitting: Cardiology

## 2020-07-26 ENCOUNTER — Other Ambulatory Visit: Payer: Self-pay

## 2020-07-26 ENCOUNTER — Ambulatory Visit (HOSPITAL_BASED_OUTPATIENT_CLINIC_OR_DEPARTMENT_OTHER)
Admission: RE | Admit: 2020-07-26 | Discharge: 2020-07-26 | Disposition: A | Discharge status: Home / Self Care | Payer: Managed Care, Other (non HMO) | Source: Ambulatory Visit | Attending: Family Medicine | Admitting: Family Medicine

## 2020-07-26 ENCOUNTER — Encounter: Payer: Self-pay | Admitting: Family Medicine

## 2020-07-26 VITALS — BP 120/76 | HR 48 | Temp 98.2°F | Ht 65.0 in | Wt 229.2 lb

## 2020-07-26 DIAGNOSIS — J302 Other seasonal allergic rhinitis: Secondary | ICD-10-CM

## 2020-07-26 DIAGNOSIS — F411 Generalized anxiety disorder: Secondary | ICD-10-CM | POA: Diagnosis not present

## 2020-07-26 DIAGNOSIS — R0600 Dyspnea, unspecified: Secondary | ICD-10-CM

## 2020-07-26 MED ORDER — ALPRAZOLAM 1 MG PO TABS: 0.5000 mg | ORAL_TABLET | Freq: Every evening | ORAL | 2 refills | Status: DC | PRN

## 2020-07-26 MED ORDER — LEVOCETIRIZINE DIHYDROCHLORIDE 5 MG PO TABS: 5.0000 mg | ORAL_TABLET | Freq: Every evening | ORAL | 2 refills | Status: DC

## 2020-07-26 NOTE — Progress Notes (Signed)
Chief Complaint  Patient presents with  . Follow-up    Subjective Corey [name] presents for f/u anxiety/depression.  Pt is currently being treated with Xanax 0.5-1 mg prn and he was started on Prozac.  Reports doing better since treatment. He stopped the Prozac because he did not like the way it made him feel. His wife's health seems to be improving so he is feeling better. No thoughts of harming self or others. No self-medication with alcohol, prescription drugs or illicit drugs. Pt is not following with a counselor/psychologist.  Patient still has shortness of breath with exertion. Humidity seems to make things worse. He works outside and is requesting more time off of work. He is never been on an inhaler for this but is interested. He has a chest x-ray scheduled for today. If normal, he would like to start the inhaler.  Past Medical History:  Diagnosis Date  . Arthritis   . Benign essential HTN 02/10/2016  . GERD (gastroesophageal reflux disease)    occ  . History of kidney stones   . Obesity   . Shortness of breath    occ-allergies  . Sleep apnea    cpap 16 yrs   Allergies as of 07/26/2020      Reactions   Atorvastatin Other (See Comments)   Myalgias   Crestor [rosuvastatin] Other (See Comments)   Myalgias      Medication List       Accurate as of July 26, 2020  3:08 PM. If you have any questions, ask your nurse or doctor.        STOP taking these medications   FLUoxetine 20 MG tablet Commonly known as: PROZAC Stopped by: Corey Dory, DO     TAKE these medications   acetaminophen 500 MG tablet Commonly known as: TYLENOL Take 1,000 mg by mouth daily.   ALPRAZolam 1 MG tablet Commonly known as: XANAX Take 0.5-1 tablets (0.5-1 mg total) by mouth at bedtime as needed for anxiety or sleep.   aspirin EC 81 MG tablet Take 1 tablet (81 mg total) by mouth daily.   celecoxib 200 MG capsule Commonly known as: CELEBREX Take 200 mg by mouth  daily.   furosemide 20 MG tablet Commonly known as: LASIX Take 1 tablet (20 mg total) by mouth daily.   levocetirizine 5 MG tablet Commonly known as: XYZAL Take 1 tablet (5 mg total) by mouth every evening. Started by: Corey Dory, DO   losartan-hydrochlorothiazide 100-25 MG tablet Commonly known as: HYZAAR Take 1 tablet by mouth daily.   nitroGLYCERIN 0.4 MG SL tablet Commonly known as: NITROSTAT Place 1 tablet (0.4 mg total) under the tongue every 5 (five) minutes as needed for chest pain.   OMEGA-3 FISH OIL PO Take 1 tablet by mouth daily.   ranolazine 1000 MG SR tablet Commonly known as: RANEXA TAKE 1  BY MOUTH TWICE DAILY   ticagrelor 90 MG Tabs tablet Commonly known as: Brilinta Take 1 tablet (90 mg total) by mouth 2 (two) times daily.   Vitamin D3 1.25 MG (50000 UT) Tabs Take 5,000 Units by mouth daily.       Exam BP 120/76 (BP Location: Left Arm, Patient Position: Sitting, Cuff Size: Large)   Pulse (!) 48   Temp 98.2 F (36.8 C) (Oral)   Ht 5\' 5"  (1.651 m)   Wt 229 lb 4 oz (104 kg)   SpO2 98%   BMI 38.15 kg/m  General:  well developed, well nourished, in  no apparent distress Heart: RRR, no lower extremity edema Lungs:  CTAB. No respiratory distress Psych: well oriented with normal range of affect and age-appropriate judgement/insight, alert and oriented x4.  Assessment and Plan  GAD (generalized anxiety disorder) - Plan: ALPRAZolam (XANAX) 1 MG tablet  Dyspnea, unspecified type - Plan: DG Chest 2 View  1. I think he is stable for now. I will continue the Xanax. Ideally, I would like him on less per day and would like to revisit the daily medication in the future. Given the help of his wife, I feel it is reasonable to hold off on such a change at this time. 2. Check chest x-ray. If normal, will send in albuterol. Letter for work given to write out for the next month. I am curious to see how he does when the weather cools down. F/u in 1  month to recheck #2. The patient voiced understanding and agreement to the plan.  Jilda Roche [place], DO 07/26/20 3:08 PM

## 2020-07-26 NOTE — Patient Instructions (Addendum)
Claritin (loratadine), Allegra (fexofenadine), Zyrtec (cetirizine) which is also equivalent to Xyzal (levocetirizine); these are listed in order from weakest to strongest. Generic, and therefore cheaper, options are in the parentheses.   Flonase (fluticasone); nasal spray that is over the counter. 2 sprays each nostril, once daily. Aim towards the same side eye when you spray.  There are available OTC, and the generic versions, which may be cheaper, are in parentheses. Show this to a pharmacist if you have trouble finding any of these items.  We will be in touch regarding your X-ray. I will send in an inhaler if it is normal.   Let me know if they give you issues with refilling the Xanax.   Let us know if you need anything.

## 2020-07-27 ENCOUNTER — Telehealth: Payer: Self-pay | Admitting: Family Medicine

## 2020-07-27 ENCOUNTER — Other Ambulatory Visit: Payer: Self-pay | Admitting: Family Medicine

## 2020-07-27 NOTE — Telephone Encounter (Signed)
Zella Ball, can you find out his settings and send it in until he can be seen please? I have a hard time thinking another group could get him in sooner with the labor shortage. The X-ray has not been read yet by radiology but I do not see anything abnormal at a glance. Ty.

## 2020-07-27 NOTE — Telephone Encounter (Signed)
Called left message to call back 

## 2020-07-27 NOTE — Telephone Encounter (Signed)
The daughter in law returned the call. They will wait for now to order a new CPAP until he see Pulmonary in November. They will get on their cancellation list. She verbalized understanding.

## 2020-07-27 NOTE — Telephone Encounter (Signed)
Caller : Patient's daughter  Call Back # [phone]  Patient's daughter states that her father has a recall on CPAP machine. Patient also had a referral place to [facility] and they can not see him unitl 09/2020. Please Advise if patient can be seen sooner than later by another office.  Patient's daughter is also requesting xray results.

## 2020-07-27 NOTE — Progress Notes (Signed)
DME 

## 2020-07-28 ENCOUNTER — Other Ambulatory Visit: Payer: Self-pay | Admitting: Family Medicine

## 2020-07-28 MED ORDER — ALBUTEROL SULFATE HFA 108 (90 BASE) MCG/ACT IN AERS: 2.0000 | INHALATION_SPRAY | Freq: Four times a day (QID) | RESPIRATORY_TRACT | 0 refills | Status: DC | PRN

## 2020-07-29 ENCOUNTER — Telehealth: Payer: Self-pay | Admitting: Family Medicine

## 2020-07-29 DIAGNOSIS — F411 Generalized anxiety disorder: Secondary | ICD-10-CM

## 2020-07-29 MED ORDER — ALPRAZOLAM 1 MG PO TABS: 0.5000 mg | ORAL_TABLET | Freq: Three times a day (TID) | ORAL | 2 refills | Status: DC | PRN

## 2020-07-29 NOTE — Telephone Encounter (Signed)
Called informed prescription sent in. Informed ok per PCP for NP appt.

## 2020-07-29 NOTE — Telephone Encounter (Signed)
The patients daughter Corey [name] would like to know if you would take her on as PCP

## 2020-07-29 NOTE — Telephone Encounter (Signed)
Refill resent. OK to sched pt's daughter, Oct or beyond please. Ty.

## 2020-07-29 NOTE — Telephone Encounter (Signed)
Recent alprazolam refill needed to state to take three times daily in order for them to refill, since previous one stated to take tid..  They will not fill until October.  So wording/instructions need to be changed. BTW in [place] has been very helpful

## 2020-08-02 ENCOUNTER — Telehealth: Payer: Self-pay | Admitting: Family Medicine

## 2020-08-02 NOTE — Telephone Encounter (Signed)
Pt dropped off Short Term Disability form.  Form was put into [place] folder up front

## 2020-08-02 NOTE — Telephone Encounter (Signed)
Paperwork completed. Copy done/sent to scan Called the patient to have him pickup at his convenience.

## 2020-08-23 ENCOUNTER — Telehealth: Payer: Self-pay | Admitting: Cardiology

## 2020-08-23 NOTE — Telephone Encounter (Signed)
Called patient. His wife just died last night due to covid complications. He has been having intermittent chest burning sensation in his chest. He doesn't know if from stress of wife dying or a really issue. No radiation of the sensation reported. No new shortness of breath. He walked up and down drive way today with no issues. Advised him if sensation returns or worsens to go to the emergency room. Patient would like to be seen any day but tomorrow. Will consul with Dr. Bing Matter on a plan as he is booked until November.

## 2020-08-23 NOTE — Telephone Encounter (Signed)
° °  Pt is calling, he said both him and his wife had covid in July and stayed in the hospital for 2 months. His wife died recently due to covid complication she had aneurism. He is calling because he feels burning sensation on his chest again and his pcp called him and told him need to see Dr. Kirtland Bouchard as soon possible. Only available is tomorrow at [employer] at 2:20, he cant make that since they need to og ot the funeral tomorrow for his wife. He needs to see Dr. Kirtland Bouchard either [facility] or [place] any day except for tomorrow

## 2020-08-24 NOTE — Telephone Encounter (Signed)
Good advise, please ask him to call us back if symptoms develop again

## 2020-08-26 NOTE — Telephone Encounter (Signed)
Left message for patient to return call.

## 2020-08-29 NOTE — Telephone Encounter (Signed)
Follow up  Pt is returning call to [facility]   Please call back

## 2020-08-29 NOTE — Telephone Encounter (Signed)
Patient calling back to update Dr. Kirtland Bouchard on his symtpoms. See 9/28 phone note. Patient is still experiencing intermitent chest discomfort that he describes as a burning sensation. Denies any actual CP or worsening SOB. Patient states that this feeling seems to happen more when he becomes upset. Patient lost his wife one week ago and is still dealing with a lot of anxiety regarding her passing. Patient states he spoke with his PCP recently who suggested he reach back out to his cardiologist to be evaluated. Patient reports having got a PRN Xanax Rx from PCP for his increased anxiety over recent life events. Patient is aware of which symptoms would warrant a call to EMS versus a call back to our office. Will route to Dr. Kirtland Bouchard for further advisement.

## 2020-09-01 NOTE — Telephone Encounter (Signed)
Called patient. Advised him of Dr. Vanetta Shawl recommendation. He does not want to start because he feels it could make his blood pressure even lower. He prefers to discuss in person with Dr. Bing Matter. Patient has appointment for the end of the month. He will call if he needs anything further in the mean time. No further questions.

## 2020-09-01 NOTE — Telephone Encounter (Signed)
Please add metoprolol 25 mg twice daily to his medical regiment.

## 2020-09-02 ENCOUNTER — Other Ambulatory Visit: Payer: Self-pay | Admitting: Family Medicine

## 2020-09-02 ENCOUNTER — Telehealth: Payer: Self-pay | Admitting: Family Medicine

## 2020-09-02 DIAGNOSIS — F4321 Adjustment disorder with depressed mood: Secondary | ICD-10-CM

## 2020-09-02 NOTE — Telephone Encounter (Signed)
Called and scheduled with PCP for 09/06/20

## 2020-09-02 NOTE — Telephone Encounter (Signed)
The patient stated the Alprazolam if working ok at night and he is able to sleep. But day time is very hard and cannot stop thinking about his wife's death. Did put in a referral for Grief counseling

## 2020-09-02 NOTE — Telephone Encounter (Signed)
Sched appt next week sometime plz.

## 2020-09-06 ENCOUNTER — Other Ambulatory Visit: Payer: Self-pay

## 2020-09-06 ENCOUNTER — Ambulatory Visit: Payer: Managed Care, Other (non HMO) | Admitting: Family Medicine

## 2020-09-06 ENCOUNTER — Encounter: Payer: Self-pay | Admitting: Family Medicine

## 2020-09-06 VITALS — BP 118/70 | HR 77 | Temp 97.8°F | Ht 65.0 in | Wt 225.0 lb

## 2020-09-06 DIAGNOSIS — Z634 Disappearance and death of family member: Secondary | ICD-10-CM | POA: Diagnosis not present

## 2020-09-06 DIAGNOSIS — R0789 Other chest pain: Secondary | ICD-10-CM | POA: Diagnosis not present

## 2020-09-06 NOTE — Patient Instructions (Addendum)
Take Xanax if you are having chest burning. If you are still having pain/tightness after stress is better, you need to let your cardiologist know.   Listen to your body.   Let us know if you need anything.

## 2020-09-06 NOTE — Progress Notes (Signed)
Chief Complaint  Patient presents with   Follow-up    grief    Subjective: Patient is a 59 y.o. male here for grief.  Pt's wife passed away on 09-07-20 after a nearly 2 month long inpatient battle w covid. She was on ECMO and MV, w the hope to wean on 10/1. Unfortunately she developed a fatal brain bleed leading to the family understandably removing life-sustained care. He is not on a daily medication. He is getting by with Xanax 1 mg sometimes twice daily that is helping him manage his symptoms. No HI or SI ideation.   Chest pain/tightness Duration of issue: several weeks Quality: burning Palliation: Rest Provocation: Stress Severity: 1-5/10 Radiation: none Duration of chest pain: several minutes Associated symptoms: none Cardiac history: +CAD  Past Medical History:  Diagnosis Date   Arthritis    Benign essential HTN 02/10/2016   GERD (gastroesophageal reflux disease)    occ   History of kidney stones    Obesity    Shortness of breath    occ-allergies   Sleep apnea    cpap 16 yrs    Objective: BP 118/70 (BP Location: Left Arm, Patient Position: Sitting, Cuff Size: Normal)    Pulse 77    Temp 97.8 F (36.6 C) (Oral)    Ht 5\' 5"  (1.651 m)    Wt 225 lb (102.1 kg)    SpO2 96%    BMI 37.44 kg/m  General: Awake, appears stated age HEENT: MMM, EOMi Heart: RRR, no bruits, no LE edema Lungs: CTAB, no rales, wheezes or rhonchi. No accessory muscle use Psych: Age appropriate judgment and insight, normal affect and mood  Assessment and Plan: Bereavement  Chest tightness  1. Cont Xanax prn. Bereavement counseling is being set up. Offered daily med, but he declined at this time. 2. EKG today. Pt has a cardiologist. He is compliant with his medication. I would like him to try a Xanax if he is having stress. If the pain lingers after the stress is relieved, I would want him to see his cardiologist. If it resolves with the stress, it is likely moreso related to his  bereavement.  EKG shows NSR, nml axis, no interval abn, no ST elevations or depression, no T wave changes, good R wave progression. This is a single PVC.  F/u prn at this point.  The patient voiced understanding and agreement to the plan.  [place], DO 09/06/20  11:20 AM

## 2020-09-08 ENCOUNTER — Ambulatory Visit: Payer: 59 | Admitting: Psychology

## 2020-09-19 ENCOUNTER — Other Ambulatory Visit: Payer: Self-pay

## 2020-09-19 ENCOUNTER — Encounter: Payer: Self-pay | Admitting: Family Medicine

## 2020-09-19 ENCOUNTER — Ambulatory Visit: Payer: Managed Care, Other (non HMO) | Admitting: Family Medicine

## 2020-09-19 VITALS — BP 108/68 | HR 80 | Temp 97.8°F | Ht 65.0 in | Wt 225.0 lb

## 2020-09-19 DIAGNOSIS — G4700 Insomnia, unspecified: Secondary | ICD-10-CM | POA: Diagnosis not present

## 2020-09-19 MED ORDER — TRAZODONE HCL 50 MG PO TABS: 25.0000 mg | ORAL_TABLET | Freq: Every evening | ORAL | 3 refills | Status: DC | PRN

## 2020-09-19 NOTE — Patient Instructions (Signed)
Sleep is important to us all. Getting good sleep is imperative to adequate functioning during the day. Work with our counselors who are trained to help people obtain quality sleep. Call [phone] to schedule an appointment or if you are curious about insurance coverage/cost.  Sleep Hygiene Tips:  Do not watch TV or look at screens within 1 hour of going to bed. If you do, make sure there is a blue light filter (nighttime mode) involved.  Try to go to bed around the same time every night. Wake up at the same time within 1 hour of regular time. Ex: If you wake up at 7 AM for work, do not sleep past 8 AM on days that you don't work.  Do not drink alcohol before bedtime.  Do not consume caffeine-containing beverages after noon or within 9 hours of intended bedtime.  Get regular exercise/physical activity in your life, but not within 2 hours of planned bedtime.  Do not take naps.   Do not eat within 2 hours of planned bedtime.  Melatonin, 3-5 mg 30-60 minutes before planned bedtime may be helpful.   The bed should be for sleep or sex only. If after 20-30 minutes you are unable to fall asleep, get up and do something relaxing. Do this until you feel ready to go to sleep again.   Let us know if you need anything.  

## 2020-09-19 NOTE — Progress Notes (Signed)
Chief Complaint  Patient presents with  . Insomnia    Subjective: Patient is a 59 y.o. male here for insomnia.  2 weeks of difficulty falling asleep and staying asleep. Mind is racing and he cannot fall back asleep. Does not consume caffeine or alcohol. Does not nap. He has been using Xanax without relief. His wife recently passed away and has been giving him issues.   Past Medical History:  Diagnosis Date  . Arthritis   . Benign essential HTN 02/10/2016  . GERD (gastroesophageal reflux disease)    occ  . History of kidney stones   . Obesity   . Shortness of breath    occ-allergies  . Sleep apnea    cpap 16 yrs    Objective: BP 108/68 (BP Location: Left Arm, Patient Position: Sitting, Cuff Size: Normal)   Pulse 80   Temp 97.8 F (36.6 C) (Oral)   Ht 5\' 5"  (1.651 m)   Wt 225 lb (102.1 kg)   SpO2 96%   BMI 37.44 kg/m  General: Awake, appears stated age Lungs: No accessory muscle use Psych: Age appropriate judgment and insight, normal affect and mood  Assessment and Plan: Insomnia, unspecified type - Plan: traZODone (DESYREL) 50 MG tablet  Status: Uncontrolled. Orders as above. Start trazodone 25-50 mg qhs prn. Sleep hygiene discussed and written down. [facility] info provided. I will see him in 4 weeks.  The patient voiced understanding and agreement to the plan.  [facility] [place], DO 09/19/20  3:36 PM

## 2020-09-20 DIAGNOSIS — Z87442 Personal history of urinary calculi: Secondary | ICD-10-CM | POA: Insufficient documentation

## 2020-09-20 DIAGNOSIS — G4730 Sleep apnea, unspecified: Secondary | ICD-10-CM | POA: Insufficient documentation

## 2020-09-20 DIAGNOSIS — R0602 Shortness of breath: Secondary | ICD-10-CM | POA: Insufficient documentation

## 2020-09-20 DIAGNOSIS — E669 Obesity, unspecified: Secondary | ICD-10-CM | POA: Insufficient documentation

## 2020-09-20 DIAGNOSIS — M1990 Unspecified osteoarthritis, unspecified site: Secondary | ICD-10-CM | POA: Insufficient documentation

## 2020-09-22 ENCOUNTER — Ambulatory Visit: Payer: Managed Care, Other (non HMO) | Admitting: Cardiology

## 2020-09-22 ENCOUNTER — Other Ambulatory Visit: Payer: Self-pay

## 2020-09-22 VITALS — BP 122/72 | HR 70 | Ht 65.0 in | Wt 225.0 lb

## 2020-09-22 DIAGNOSIS — I2510 Atherosclerotic heart disease of native coronary artery without angina pectoris: Secondary | ICD-10-CM | POA: Diagnosis not present

## 2020-09-22 DIAGNOSIS — I10 Essential (primary) hypertension: Secondary | ICD-10-CM | POA: Diagnosis not present

## 2020-09-22 DIAGNOSIS — E785 Hyperlipidemia, unspecified: Secondary | ICD-10-CM

## 2020-09-22 MED ORDER — EZETIMIBE 10 MG PO TABS: 10.0000 mg | ORAL_TABLET | Freq: Every day | ORAL | 1 refills | Status: DC

## 2020-09-22 NOTE — Patient Instructions (Signed)
Medication Instructions:  Your physician has recommended you make the following change in your medication:   START: ZETIA 10 MG DAILY  STOP: BRILINTA  *If you need a refill on your cardiac medications before your next appointment, please call your pharmacy*   Lab Work: NONE If you have labs (blood work) drawn today and your tests are completely normal, you will receive your results only by: [name] MyChart Message (if you have MyChart) OR . A paper copy in the mail If you have any lab test that is abnormal or we need to change your treatment, we will call you to review the results.   Testing/Procedures: NONE   Follow-Up: At [facility], you and your health needs are our priority.  As part of our continuing mission to provide you with exceptional heart care, we have created designated Provider Care Teams.  These Care Teams include your primary Cardiologist (physician) and Advanced Practice Providers (APPs -  Physician Assistants and Nurse Practitioners) who all work together to provide you with the care you need, when you need it.  We recommend signing up for the patient portal called "MyChart".  Sign up information is provided on this After Visit Summary.  MyChart is used to connect with patients for Virtual Visits (Telemedicine).  Patients are able to view lab/test results, encounter notes, upcoming appointments, etc.  Non-urgent messages can be sent to your provider as well.   To learn more about what you can do with MyChart, go to [email or web address].    Your next appointment:   3 month(s)  The format for your next appointment:   In Person  Provider:   [name], MD   Other Instructions  Ezetimibe Tablets What is this medicine? EZETIMIBE (ez ET i mibe) blocks the absorption of cholesterol from the stomach. It can help lower blood cholesterol for patients who are at risk of getting heart disease or a stroke. It is only for patients whose cholesterol level is not  controlled by diet. This medicine may be used for other purposes; ask your health care provider or pharmacist if you have questions. COMMON BRAND NAME(S): Zetia What should I tell my health care provider before I take this medicine? They need to know if you have any of these conditions:  liver disease  an unusual or allergic reaction to ezetimibe, medicines, foods, dyes, or preservatives  pregnant or trying to get pregnant  breast-feeding How should I use this medicine? Take this medicine by mouth with a glass of water. Follow the directions on the prescription label. This medicine can be taken with or without food. Take your doses at regular intervals. Do not take your medicine more often than directed. Talk to your pediatrician regarding the use of this medicine in children. Special care may be needed. Overdosage: If you think you have taken too much of this medicine contact a poison control center or emergency room at once. NOTE: This medicine is only for you. Do not share this medicine with others. What if I miss a dose? If you miss a dose, take it as soon as you can. If it is almost time for your next dose, take only that dose. Do not take double or extra doses. What may interact with this medicine? Do not take this medicine with any of the following medications:  fenofibrate  gemfibrozil This medicine may also interact with the following medications:  antacids  cyclosporine  herbal medicines like red yeast rice  other medicines to lower cholesterol or  triglycerides This list may not describe all possible interactions. Give your health care provider a list of all the medicines, herbs, non-prescription drugs, or dietary supplements you use. Also tell them if you smoke, drink alcohol, or use illegal drugs. Some items may interact with your medicine. What should I watch for while using this medicine? Visit your doctor or health care professional for regular checks on your  progress. You will need to have your cholesterol levels checked. If you are also taking some other cholesterol medicines, you will also need to have tests to make sure your liver is working properly. Tell your doctor or health care professional if you get any unexplained muscle pain, tenderness, or weakness, especially if you also have a fever and tiredness. You need to follow a low-cholesterol, low-fat diet while you are taking this medicine. This will decrease your risk of getting heart and blood vessel disease. Exercising and avoiding alcohol and smoking can also help. Ask your doctor or dietician for advice. What side effects may I notice from receiving this medicine? Side effects that you should report to your doctor or health care professional as soon as possible:  allergic reactions like skin rash, itching or hives, swelling of the face, lips, or tongue  dark yellow or brown urine  unusually weak or tired  yellowing of the skin or eyes Side effects that usually do not require medical attention (report to your doctor or health care professional if they continue or are bothersome):  diarrhea  dizziness  headache  stomach upset or pain This list may not describe all possible side effects. Call your doctor for medical advice about side effects. You may report side effects to FDA at 1-800-FDA-1088. Where should I keep my medicine? Keep out of the reach of children. Store at room temperature between 15 and 30 degrees C (59 and 86 degrees F). Protect from moisture. Keep container tightly closed. Throw away any unused medicine after the expiration date. NOTE: This sheet is a summary. It may not cover all possible information. If you have questions about this medicine, talk to your doctor, pharmacist, or health care provider.  2020 Elsevier/Gold Standard (2012-05-19 15:39:09)

## 2020-09-24 DIAGNOSIS — E785 Hyperlipidemia, unspecified: Secondary | ICD-10-CM | POA: Insufficient documentation

## 2020-09-24 HISTORY — DX: Hyperlipidemia, unspecified: E78.5

## 2020-09-24 NOTE — Progress Notes (Signed)
Cardiology Office Note:    Date:  09/24/2020   ID:  Corey [name], DOB February 23, 1961, MRN [id number]  PCP:  [name], DO  Cardiologist:  [name], MD    Referring MD: [name]*   No chief complaint on file. I am not doing well, my wife just died because of Covid  History of Present Illness:    Corey [name] is a 58 y.o. male with past medical history significant for coronary artery disease status post drug-eluting stent to circumflex artery in July 2020, essential hypertension, dyslipidemia, comes today to my for follow-up overall cardiac wise doing well.  Denies have any chest pain tightness squeezing pressure burning chest the tragic describes he is wife was sick with Covid and spent months in the hospital required intubation, eventually tracheostomy and then ECMO in spite of all this after she passed he does not want to talk too much about his problems he is more concerned about the fact that his wife passed.  Luckily it looks like from cardiac standpoint of view he is doing well.  He still on Brilinta and I told him to stop it since this is more than a year after drug-eluting stent being implanted to circumflex artery.  Past Medical History:  Diagnosis Date  . Angina pectoris (HCC) 06/04/2019  . Arthritis   . Benign essential HTN 02/10/2016  . Bronchospasm with bronchitis, acute 12/14/2013  . Coronary artery disease status post PTCA and drug-eluting stent to circumflex artery in July 2020 06/26/2019  . GAD (generalized anxiety disorder) 12/14/2013  . GERD (gastroesophageal reflux disease)    occ  . History of kidney stones   . Hypertension 12/14/2013  . Hypogonadism male 12/14/2013  . Kidney stones 02/10/2016  . Myelopathy (HCC) 06/03/2014  . Obesity   . OSA (obstructive sleep apnea) 02/10/2016  . Shortness of breath    occ-allergies  . Sleep apnea    cpap 16 yrs    Past Surgical History:  Procedure Laterality Date  . ANTERIOR CERVICAL  DECOMP/DISCECTOMY FUSION N/A 06/03/2014   Procedure: ANTERIOR CERVICAL DECOMPRESSION FUSION, CERVICAL FIVE-SIX, CERVICAL SIX-SEVEN WITH INSTRUMENTATION AND ALLOGRAFT;  Surgeon: Corey Hero, MD;  Location: [facility] OR;  Service: Orthopedics;  Laterality: N/A;  . CORONARY STENT INTERVENTION N/A 06/19/2019   Procedure: CORONARY STENT INTERVENTION;  Surgeon: Swaziland, Peter M, MD;  Location: [facility] INVASIVE CV LAB;  Service: Cardiovascular;  Laterality: N/A;  . I & D EXTREMITY Left 11/10/2018   Procedure: IRRIGATION AND DEBRIDEMENT EXTREMITY, REVISION AMPUTATION OF LEFT RING FINGER;  Surgeon: Mack Hook, MD;  Location: [facility] OR;  Service: Orthopedics;  Laterality: Left;  . LEFT HEART CATH AND CORONARY ANGIOGRAPHY N/A 06/19/2019   Procedure: LEFT HEART CATH AND CORONARY ANGIOGRAPHY;  Surgeon: Swaziland, Peter M, MD;  Location: [facility] INVASIVE CV LAB;  Service: Cardiovascular;  Laterality: N/A;  . URETHRAL DILATION     child    Current Medications: Current Meds  Medication Sig  . acetaminophen (TYLENOL) 500 MG tablet Take 1,000 mg by mouth daily.  [name] albuterol (VENTOLIN HFA) 108 (90 Base) MCG/ACT inhaler Inhale 2 puffs into the lungs every 6 (six) hours as needed for wheezing or shortness of breath.  . ALPRAZolam (XANAX) 1 MG tablet Take 0.5-1 tablets (0.5-1 mg total) by mouth 3 (three) times daily as needed for anxiety or sleep.  [name] aspirin EC 81 MG tablet Take 1 tablet (81 mg total) by mouth daily.  . celecoxib (CELEBREX) 200 MG capsule Take 200 mg  by mouth daily.  . Cholecalciferol (VITAMIN D3) 1.25 MG (50000 UT) TABS Take 5,000 Units by mouth daily.  . furosemide (LASIX) 20 MG tablet Take 1 tablet (20 mg total) by mouth daily.  [name] levocetirizine (XYZAL) 5 MG tablet Take 1 tablet (5 mg total) by mouth every evening.  [name] losartan-hydrochlorothiazide (HYZAAR) 100-25 MG tablet Take 1 tablet by mouth daily.  . nitroGLYCERIN (NITROSTAT) 0.4 MG SL tablet Place 1 tablet (0.4 mg total) under the tongue every 5 (five)  minutes as needed for chest pain.  . Omega-3 Fatty Acids (OMEGA-3 FISH OIL PO) Take 1 tablet by mouth daily.  . ranolazine (RANEXA) 1000 MG SR tablet TAKE 1  BY MOUTH TWICE DAILY  . [DISCONTINUED] ticagrelor (BRILINTA) 90 MG TABS tablet Take 1 tablet (90 mg total) by mouth 2 (two) times daily.     Allergies:   Atorvastatin and Crestor [rosuvastatin]   Social History   Socioeconomic History  . Marital status: Married    Spouse name: Not on file  . Number of children: Not on file  . Years of education: Not on file  . Highest education level: Not on file  Occupational History  . Not on file  Tobacco Use  . Smoking status: Former Smoker    Packs/day: 2.00    Years: 30.00    Pack years: 60.00    Types: Cigarettes    Quit date: 05/24/2012    Years since quitting: 8.3  . Smokeless tobacco: Never Used  Substance and Sexual Activity  . Alcohol use: No  . Drug use: No  . Sexual activity: Not on file  Other Topics Concern  . Not on file  Social History Narrative  . Not on file   Social Determinants of Health   Financial Resource Strain:   . Difficulty of Paying Living Expenses: Not on file  Food Insecurity:   . Worried About Programme researcher, broadcasting/film/video in the Last Year: Not on file  . Ran Out of Food in the Last Year: Not on file  Transportation Needs:   . Lack of Transportation (Medical): Not on file  . Lack of Transportation (Non-Medical): Not on file  Physical Activity:   . Days of Exercise per Week: Not on file  . Minutes of Exercise per Session: Not on file  Stress:   . Feeling of Stress : Not on file  Social Connections:   . Frequency of Communication with Friends and Family: Not on file  . Frequency of Social Gatherings with Friends and Family: Not on file  . Attends Religious Services: Not on file  . Active Member of Clubs or Organizations: Not on file  . Attends Banker Meetings: Not on file  . Marital Status: Not on file     Family History: The  patient's family history includes Arrhythmia in his mother; CAD (age of onset: 44) in his father; Heart failure in his mother. ROS:   Please see the history of present illness.    All 14 point review of systems negative except as described per history of present illness  EKGs/Labs/Other Studies Reviewed:      Recent Labs: No results found for requested labs within last 8760 hours.  Recent Lipid Panel    Component Value Date/Time   CHOL 176 03/18/2020 1556   TRIG 221 (H) 03/18/2020 1556   HDL 42 03/18/2020 1556   CHOLHDL 4.2 03/18/2020 1556   LDLCALC 96 03/18/2020 1556   LDLDIRECT 108 (H) 03/18/2020 1556  Physical Exam:    VS:  BP 122/72   Pulse 70   Ht 5\' 5"  (1.651 m)   Wt 225 lb (102.1 kg)   SpO2 93%   BMI 37.44 kg/m     Wt Readings from Last 3 Encounters:  09/22/20 225 lb (102.1 kg)  09/19/20 225 lb (102.1 kg)  09/06/20 225 lb (102.1 kg)     GEN:  Well nourished, well developed in no acute distress HEENT: Normal NECK: No JVD; No carotid bruits LYMPHATICS: No lymphadenopathy CARDIAC: RRR, no murmurs, no rubs, no gallops RESPIRATORY:  Clear to auscultation without rales, wheezing or rhonchi  ABDOMEN: Soft, non-tender, non-distended MUSCULOSKELETAL:  No edema; No deformity  SKIN: Warm and dry LOWER EXTREMITIES: no swelling NEUROLOGIC:  Alert and oriented x 3 PSYCHIATRIC:  Normal affect   ASSESSMENT:    1. Coronary artery disease involving native coronary artery of native heart without angina pectoris   2. Benign essential HTN   3. Dyslipidemia    PLAN:    In order of problems listed above:  1. Coronary disease status post PTCA and stenting of circumflex artery in July 2020.  Will discontinue his Brilinta we will continue aspirin.  He is asymptomatic.  Initially had a problem Brilinta because of shortness of breath but likely was able to tolerate and continue course of dual antiplatelet therapy for actually more than a year. 2. Benign essential  hypertension: Blood pressure well controlled today continue present management. 3. Dyslipidemia he is on Zetia I will make arrangements for fasting good profile done. 4. Somewhat difficult visit he t does not care about himself obviously he is grieving after his wife passing.  I will see him within next 3 to 4 months and continue discussion about his issues.   Medication Adjustments/Labs and Tests Ordered: Current medicines are reviewed at length with the patient today.  Concerns regarding medicines are outlined above.  No orders of the defined types were placed in this encounter.  Medication changes:  Meds ordered this encounter  Medications  . ezetimibe (ZETIA) 10 MG tablet    Sig: Take 1 tablet (10 mg total) by mouth daily.    Dispense:  90 tablet    Refill:  1    Signed, August 2020, MD, [facility] 09/24/2020 8:42 AM    [facility]

## 2020-09-26 ENCOUNTER — Institutional Professional Consult (permissible substitution): Payer: Managed Care, Other (non HMO) | Admitting: Pulmonary Disease

## 2020-09-28 ENCOUNTER — Encounter: Payer: Self-pay | Admitting: Internal Medicine

## 2020-09-28 ENCOUNTER — Ambulatory Visit: Payer: Managed Care, Other (non HMO) | Admitting: Internal Medicine

## 2020-09-28 ENCOUNTER — Other Ambulatory Visit: Payer: Self-pay

## 2020-09-28 VITALS — BP 130/70 | HR 74 | Ht 65.0 in | Wt 224.0 lb

## 2020-09-28 DIAGNOSIS — E7841 Elevated Lipoprotein(a): Secondary | ICD-10-CM

## 2020-09-28 DIAGNOSIS — M791 Myalgia, unspecified site: Secondary | ICD-10-CM | POA: Diagnosis not present

## 2020-09-28 DIAGNOSIS — E782 Mixed hyperlipidemia: Secondary | ICD-10-CM | POA: Diagnosis not present

## 2020-09-28 DIAGNOSIS — I2510 Atherosclerotic heart disease of native coronary artery without angina pectoris: Secondary | ICD-10-CM | POA: Diagnosis not present

## 2020-09-28 DIAGNOSIS — T466X5A Adverse effect of antihyperlipidemic and antiarteriosclerotic drugs, initial encounter: Secondary | ICD-10-CM

## 2020-09-28 NOTE — Patient Instructions (Signed)

## 2020-09-28 NOTE — Progress Notes (Signed)
LIPID CLINIC CONSULT NOTE  Chief Complaint:  Follow-up dyslipidemia  Primary Care Physician: [name], DO  Primary Cardiologist:  [name], MD  HPI:  Corey [name] is a 59 y.o. male who is being seen today for the evaluation of dyslipidemia at the request of [name]*.  This is a pleasant 59 year old male kindly referred for evaluation and management of dyslipidemia by Dr. Kirtland Bouchard.  Corey [name] has a history of coronary artery disease with drug-eluting stent to the mid circumflex and a chronic total occlusion of the right coronary artery by cath in July 2020.  He also has hypertension and dyslipidemia.  He has been treated with Ranexa for chronic anginal symptoms.  More recently had a lipid profile which showed total cholesterol of, triglycerides 185, HDL 36 and LDL of 102.  Unfortunately he had myalgias on both atorvastatin and rosuvastatin.  At that time it was recommended he start ezetimibe 10 mg daily however he did not start the medication.  He has not had lipid reassessment since that time.  He reports a variable diet however does have decent amount of saturated fats and there can be an improvement in some fried foods.  He is also listed as taking over-the-counter fish oil as well.  09/28/2020  Corey [name] returns today for follow-up. He recently saw Dr. Kirtland Bouchard in the office and there was some further discussion about his lipids. He had had repeat lipids drawn in April 2021 after an attempt to make significant dietary changes. His total cholesterol had not changed much, up 6 points with triglycerides higher at 221 and LDL of 96 (down from 102). Based on this Dr. Kirtland Bouchard started him on ezetimibe 10 mg daily. He is only been on that now for about a week. So far seems to be able to tolerate it. He did have an elevated LP(a) at 110 which may be expected to increase cardiovascular risk.  PMHx:  Past Medical History:  Diagnosis Date  . Angina pectoris (HCC) 06/04/2019    . Arthritis   . Benign essential HTN 02/10/2016  . Bronchospasm with bronchitis, acute 12/14/2013  . Coronary artery disease status post PTCA and drug-eluting stent to circumflex artery in July 2020 06/26/2019  . GAD (generalized anxiety disorder) 12/14/2013  . GERD (gastroesophageal reflux disease)    occ  . History of kidney stones   . Hypertension 12/14/2013  . Hypogonadism male 12/14/2013  . Kidney stones 02/10/2016  . Myelopathy (HCC) 06/03/2014  . Obesity   . OSA (obstructive sleep apnea) 02/10/2016  . Shortness of breath    occ-allergies  . Sleep apnea    cpap 16 yrs    Past Surgical History:  Procedure Laterality Date  . ANTERIOR CERVICAL DECOMP/DISCECTOMY FUSION N/A 06/03/2014   Procedure: ANTERIOR CERVICAL DECOMPRESSION FUSION, CERVICAL FIVE-SIX, CERVICAL SIX-SEVEN WITH INSTRUMENTATION AND ALLOGRAFT;  Surgeon: Emilee Hero, MD;  Location: [facility] OR;  Service: Orthopedics;  Laterality: N/A;  . CORONARY STENT INTERVENTION N/A 06/19/2019   Procedure: CORONARY STENT INTERVENTION;  Surgeon: Swaziland, Peter M, MD;  Location: [facility] INVASIVE CV LAB;  Service: Cardiovascular;  Laterality: N/A;  . I & D EXTREMITY Left 11/10/2018   Procedure: IRRIGATION AND DEBRIDEMENT EXTREMITY, REVISION AMPUTATION OF LEFT RING FINGER;  Surgeon: Mack Hook, MD;  Location: [facility] OR;  Service: Orthopedics;  Laterality: Left;  . LEFT HEART CATH AND CORONARY ANGIOGRAPHY N/A 06/19/2019   Procedure: LEFT HEART CATH AND CORONARY ANGIOGRAPHY;  Surgeon: Swaziland, Peter M, MD;  Location:  [facility] INVASIVE CV LAB;  Service: Cardiovascular;  Laterality: N/A;  . URETHRAL DILATION     child    FAMHx:  Family History  Problem Relation Age of Onset  . Arrhythmia Mother        afib  . Heart failure Mother   . CAD Father 47       CABG with redo    SOCHx:   reports that he quit smoking about 8 years ago. His smoking use included cigarettes. He has a 60.00 pack-year smoking history. He has never used smokeless tobacco. He  reports that he does not drink alcohol and does not use drugs.  ALLERGIES:  Allergies  Allergen Reactions  . Atorvastatin Other (See Comments)    Myalgias  . Crestor [Rosuvastatin] Other (See Comments)    Myalgias    ROS: Pertinent items noted in HPI and remainder of comprehensive ROS otherwise negative.  HOME MEDS: Current Outpatient Medications on File Prior to Visit  Medication Sig Dispense Refill  . acetaminophen (TYLENOL) 500 MG tablet Take 1,000 mg by mouth daily.    [name] albuterol (VENTOLIN HFA) 108 (90 Base) MCG/ACT inhaler Inhale 2 puffs into the lungs every 6 (six) hours as needed for wheezing or shortness of breath. 8 g 0  . ALPRAZolam (XANAX) 1 MG tablet Take 0.5-1 tablets (0.5-1 mg total) by mouth 3 (three) times daily as needed for anxiety or sleep. 90 tablet 2  . aspirin EC 81 MG tablet Take 1 tablet (81 mg total) by mouth daily. 90 tablet 3  . celecoxib (CELEBREX) 200 MG capsule Take 200 mg by mouth daily.    . Cholecalciferol (VITAMIN D3) 1.25 MG (50000 UT) TABS Take 5,000 Units by mouth daily.    [name] ezetimibe (ZETIA) 10 MG tablet Take 1 tablet (10 mg total) by mouth daily. 90 tablet 1  . furosemide (LASIX) 20 MG tablet Take 1 tablet (20 mg total) by mouth daily. 90 tablet 1  . levocetirizine (XYZAL) 5 MG tablet Take 1 tablet (5 mg total) by mouth every evening. 30 tablet 2  . losartan-hydrochlorothiazide (HYZAAR) 100-25 MG tablet Take 1 tablet by mouth daily. 90 tablet 2  . nitroGLYCERIN (NITROSTAT) 0.4 MG SL tablet Place 1 tablet (0.4 mg total) under the tongue every 5 (five) minutes as needed for chest pain. 30 tablet 2  . Omega-3 Fatty Acids (OMEGA-3 FISH OIL PO) Take 1 tablet by mouth daily.    . ranolazine (RANEXA) 1000 MG SR tablet TAKE 1  BY MOUTH TWICE DAILY 180 tablet 1   No current facility-administered medications on file prior to visit.    LABS/IMAGING: No results found for this or any previous visit (from the past 48 hour(s)). No results found.  LIPID  PANEL:    Component Value Date/Time   CHOL 176 03/18/2020 1556   TRIG 221 (H) 03/18/2020 1556   HDL 42 03/18/2020 1556   CHOLHDL 4.2 03/18/2020 1556   LDLCALC 96 03/18/2020 1556   LDLDIRECT 108 (H) 03/18/2020 1556    WEIGHTS: Wt Readings from Last 3 Encounters:  09/28/20 224 lb (101.6 kg)  09/22/20 225 lb (102.1 kg)  09/19/20 225 lb (102.1 kg)    VITALS: BP 130/70   Pulse 74   Ht 5\' 5"  (1.651 m)   Wt 224 lb (101.6 kg)   SpO2 98%   BMI 37.28 kg/m   EXAM: Deferred  EKG: Deferred  ASSESSMENT: 1. Coronary artery disease status post PCI with chronic total occlusion (05/2019) 2. Mixed dyslipidemia,  goal LDL less than 70 3. Elevated LP(a) of 110 4. Statin intolerant-myalgias 5. Hypertension  PLAN: 1.   Corey [name] was not able to reach target LDL less than 70 with dietary changes alone. I agree with the recommendation to start ezetimibe which was started about a week ago. He will need repeat lipids in about 3 months and if not at target, may be a candidate for a PCSK9 inhibitor. Of note he has an elevated LP(a) of 110 which may be an ideal treatment option at some point. He might qualify for the pelcarsen trial but he was not interested at this time.  Chrystie Nose, MD, [facility], FACP    [facility]  Medical Director of the Advanced Lipid Disorders &  Cardiovascular Risk Reduction Clinic Diplomate of the American Board of Clinical Lipidology Attending Cardiologist  Direct Dial: [phone]  Fax: [phone]  Website:  [email or web address]  Lisette Abu Niajah Sipos 09/28/2020, 1:32 PM

## 2020-09-30 ENCOUNTER — Other Ambulatory Visit: Payer: Self-pay

## 2020-09-30 ENCOUNTER — Ambulatory Visit: Payer: Managed Care, Other (non HMO) | Admitting: Pulmonary Disease

## 2020-09-30 ENCOUNTER — Encounter: Payer: Self-pay | Admitting: Pulmonary Disease

## 2020-09-30 VITALS — BP 126/64 | HR 69 | Temp 98.3°F | Ht 65.0 in | Wt 225.0 lb

## 2020-09-30 DIAGNOSIS — J209 Acute bronchitis, unspecified: Secondary | ICD-10-CM

## 2020-09-30 DIAGNOSIS — G4733 Obstructive sleep apnea (adult) (pediatric): Secondary | ICD-10-CM | POA: Diagnosis not present

## 2020-09-30 NOTE — Assessment & Plan Note (Signed)
Post Covid infection. Bronchospasm appears to have resolved. He may need work-up down the line for COPD, does not want testing at this time

## 2020-09-30 NOTE — Assessment & Plan Note (Signed)
He has a [employer] auto machine, we checked and this is not part of the recall but this is old.  CPAP is certainly helped improve his daytime somnolence and fatigue.  He would be eligible for a new machine.  He prefers a nasal mask with chinstrap but does not like his current one because it leaves a mark on the bridge of his nose  Prescription for new auto CPAP 10 to 15 cm will be sent to DME Trial of AirFit N 30 nasal mask with chinstrap.  If this does not work, then you can trial full facemask AirFit F30  We will obtain report and tweak settings as needed on your next visit  Weight loss encouraged, compliance with goal of at least 4-6 hrs every night is the expectation. Advised against medications with sedative side effects Cautioned against driving when sleepy - understanding that sleepiness will vary on a day to day basis

## 2020-09-30 NOTE — Patient Instructions (Signed)
  Prescription for new auto CPAP 10 to 15 cm will be sent to DME Trial of AirFit N 30 nasal mask with chinstrap.  If this does not work, then you can trial full facemask AirFit F30  We will obtain report and tweak settings as needed on your next visit

## 2020-09-30 NOTE — Progress Notes (Signed)
Subjective:    Patient ID: Corey [name], male    DOB: 03-17-1961, 59 y.o.   MRN: [id number]  HPI  Chief Complaint  Patient presents with  . Consult    Does not feel like he is getting enough air, is not sure if his machine is one that has been recalled, not sure what settings are    59 year old man presents for evaluation and to establish care for OSA. He recently had Covid infection and unfortunately his wife of 22 years passed away after an acute illness.  He is still struggling with her demise and reliving events related to her hospital stay. He reports residual dyspnea, was treated for acute bronchitis, chest x-ray 8/31 reviewed which shows clear lungs  PMH -CAD status post stents 05/2019, just came off Brilinta, hyperlipidemia  OSA was diagnosed in 2012, we reviewed sleep study which showed severe OSA, he was placed on REMstar auto at 14 cm with nasal mask and chinstrap.  CPAP certainly helped improve his daytime somnolence and fatigue.  His compliance has been sporadic but he states that he certainly feels better on nights when he uses the machine. Epworth sleepiness score is 9. Bedtime is 10:30 PM or 11:30 PM, sleep latency for 30 minutes, denies nocturnal awakenings and is out of bed at 5:30 AM feeling rested with occasional dryness of mouth but denies headaches. He has lost about 15 pounds since his original study. 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 Angina pectoris (HCC) 06/04/2019  . Arthritis   . Benign essential HTN 02/10/2016  . Bronchospasm with bronchitis, acute 12/14/2013  . Coronary artery disease status post  PTCA and drug-eluting stent to circumflex artery in July 2020 06/26/2019  . GAD (generalized anxiety disorder) 12/14/2013  . GERD (gastroesophageal reflux disease)    occ  . History of kidney stones   . Hypertension 12/14/2013  . Hypogonadism male 12/14/2013  . Kidney stones 02/10/2016  . Myelopathy (HCC) 06/03/2014  . Obesity   . OSA (obstructive sleep apnea) 02/10/2016  . Shortness of breath    occ-allergies  . Sleep apnea    cpap 16 yrs   Past Surgical History:  Procedure Laterality Date  . ANTERIOR CERVICAL DECOMP/DISCECTOMY FUSION N/A 06/03/2014   Procedure: ANTERIOR CERVICAL DECOMPRESSION FUSION, CERVICAL FIVE-SIX, CERVICAL SIX-SEVEN WITH INSTRUMENTATION AND ALLOGRAFT;  Surgeon: Emilee Hero, MD;  Location: [facility] OR;  Service: Orthopedics;  Laterality: N/A;  . CORONARY STENT INTERVENTION N/A 06/19/2019   Procedure: CORONARY STENT INTERVENTION;  Surgeon: Swaziland, Peter M, MD;  Location: [facility] INVASIVE CV LAB;  Service: Cardiovascular;  Laterality: N/A;  . I & D EXTREMITY Left 11/10/2018   Procedure: IRRIGATION AND DEBRIDEMENT EXTREMITY, REVISION AMPUTATION OF LEFT RING FINGER;  Surgeon: Mack Hook, MD;  Location: [facility] OR;  Service: Orthopedics;  Laterality: Left;  . LEFT HEART CATH AND CORONARY ANGIOGRAPHY N/A 06/19/2019   Procedure: LEFT HEART CATH AND CORONARY ANGIOGRAPHY;  Surgeon: Swaziland, Peter M, MD;  Location: [facility] INVASIVE CV LAB;  Service: Cardiovascular;  Laterality: N/A;  . URETHRAL DILATION     child    Allergies  Allergen Reactions  . Atorvastatin  Other (See Comments)    Myalgias  . Crestor [Rosuvastatin] Other (See Comments)    Myalgias    Social History   Socioeconomic History  . Marital status: Married    Spouse name: Not on file  . Number of children: Not on file  . Years of education: Not on file  . Highest education level: Not on file  Occupational History  . Not on file  Tobacco Use  . Smoking status: Former Smoker    Packs/day: 2.00    Years: 30.00      Pack years: 60.00    Types: Cigarettes    Quit date: 05/24/2012    Years since quitting: 8.3  . Smokeless tobacco: Never Used  Vaping Use  . Vaping Use: Never used  Substance and Sexual Activity  . Alcohol use: No  . Drug use: No  . Sexual activity: Not on file  Other Topics Concern  . Not on file  Social History Narrative  . Not on file   Social Determinants of Health   Financial Resource Strain:   . Difficulty of Paying Living Expenses: Not on file  Food Insecurity:   . Worried About Programme researcher, broadcasting/film/video in the Last Year: Not on file  . Ran Out of Food in the Last Year: Not on file  Transportation Needs:   . Lack of Transportation (Medical): Not on file  . Lack of Transportation (Non-Medical): Not on file  Physical Activity:   . Days of Exercise per Week: Not on file  . Minutes of Exercise per Session: Not on file  Stress:   . Feeling of Stress : Not on file  Social Connections:   . Frequency of Communication with Friends and Family: Not on file  . Frequency of Social Gatherings with Friends and Family: Not on file  . Attends Religious Services: Not on file  . Active Member of Clubs or Organizations: Not on file  . Attends Banker Meetings: Not on file  . Marital Status: Not on file  Intimate Partner Violence:   . Fear of Current or Ex-Partner: Not on file  . Emotionally Abused: Not on file  . Physically Abused: Not on file  . Sexually Abused: Not on file     Family History  Problem Relation Age of Onset  . Arrhythmia Mother        afib  . Heart failure Mother   . CAD Father 77       CABG with redo     Review of Systems Shortness of breath with activity Nasal congestion Sneezing Grieving for his wife  Constitutional: negative for anorexia, fevers and sweats  Eyes: negative for irritation, redness and visual disturbance  Ears, nose, mouth, throat, and face: negative for earaches, epistaxis, nasal congestion and sore throat  Respiratory:  negative for cough,  sputum and wheezing  Cardiovascular: negative for chest pain,  lower extremity edema, orthopnea, palpitations and syncope  Gastrointestinal: negative for abdominal pain, constipation, diarrhea, melena, nausea and vomiting  Genitourinary:negative for dysuria, frequency and hematuria  Hematologic/lymphatic: negative for bleeding, easy bruising and lymphadenopathy  Musculoskeletal:negative for arthralgias, muscle weakness and stiff joints  Neurological: negative for coordination problems, gait problems, headaches and weakness  Endocrine: negative for diabetic symptoms including polydipsia, polyuria and weight loss     Objective:   Physical Exam  Gen. Pleasant, obese, in no distress, normal affect ENT - no pallor,icterus, no post nasal drip, class 2-3 airway Neck: No JVD, no thyromegaly, no  carotid bruits Lungs: no use of accessory muscles, no dullness to percussion, decreased without rales or rhonchi  Cardiovascular: Rhythm regular, heart sounds  normal, no murmurs or gallops, no peripheral edema Abdomen: soft and non-tender, no hepatosplenomegaly, BS normal. Musculoskeletal: No deformities, no cyanosis or clubbing Neuro:  alert, non focal, no tremors       Assessment & Plan:

## 2020-10-01 ENCOUNTER — Other Ambulatory Visit: Payer: Self-pay | Admitting: Cardiology

## 2020-10-25 ENCOUNTER — Telehealth: Payer: Self-pay | Admitting: Pulmonary Disease

## 2020-10-25 DIAGNOSIS — G4733 Obstructive sleep apnea (adult) (pediatric): Secondary | ICD-10-CM

## 2020-10-25 NOTE — Telephone Encounter (Signed)
Called and spoke with patient, advised that he is still using his old CPAP machine, he has not received his new CPAP yet and feels he is not getting enough air.  He is also not sleeping well, however, states he has a lot going on since losing his wife back in September he is unsure what is causing him not to sleep well.  I let him know that I would make Dr. Vassie Loll aware and see what he advised and after we heard back from Dr. Vassie Loll, we would call him back.  He verbalized understanding.

## 2020-10-25 NOTE — Telephone Encounter (Signed)
Okay to trial mask of choice

## 2020-10-26 NOTE — Telephone Encounter (Signed)
ATC patient unable to reach  Left detailed message with Dr. Reginia Naas recommendations per DPR  Nothing further needed at this time.

## 2020-11-17 ENCOUNTER — Other Ambulatory Visit: Payer: Self-pay | Admitting: Cardiology

## 2020-11-17 DIAGNOSIS — I2510 Atherosclerotic heart disease of native coronary artery without angina pectoris: Secondary | ICD-10-CM

## 2020-12-21 ENCOUNTER — Other Ambulatory Visit: Payer: Self-pay

## 2020-12-21 ENCOUNTER — Ambulatory Visit: Payer: BC Managed Care – PPO | Admitting: Cardiology

## 2020-12-21 ENCOUNTER — Encounter: Payer: Self-pay | Admitting: Cardiology

## 2020-12-21 VITALS — BP 158/82 | HR 66 | Ht 65.0 in | Wt 238.0 lb

## 2020-12-21 DIAGNOSIS — I2510 Atherosclerotic heart disease of native coronary artery without angina pectoris: Secondary | ICD-10-CM

## 2020-12-21 DIAGNOSIS — R0602 Shortness of breath: Secondary | ICD-10-CM | POA: Diagnosis not present

## 2020-12-21 DIAGNOSIS — E785 Hyperlipidemia, unspecified: Secondary | ICD-10-CM

## 2020-12-21 DIAGNOSIS — I10 Essential (primary) hypertension: Secondary | ICD-10-CM | POA: Diagnosis not present

## 2020-12-21 MED ORDER — NEXLETOL 180 MG PO TABS: 180.0000 mg | ORAL_TABLET | Freq: Every day | ORAL | 5 refills | Status: DC

## 2020-12-21 NOTE — Patient Instructions (Addendum)
Medication Instructions:  Your physician has recommended you make the following change in your medication:  START: Nexletol 180 mg daily  *If you need a refill on your cardiac medications before your next appointment, please call your pharmacy*   Lab Work: None If you have labs (blood work) drawn today and your tests are completely normal, you will receive your results only by: [name] MyChart Message (if you have MyChart) OR . A paper copy in the mail If you have any lab test that is abnormal or we need to change your treatment, we will call you to review the results.   Testing/Procedures: None   Follow-Up: At [facility], you and your health needs are our priority.  As part of our continuing mission to provide you with exceptional heart care, we have created designated Provider Care Teams.  These Care Teams include your primary Cardiologist (physician) and Advanced Practice Providers (APPs -  Physician Assistants and Nurse Practitioners) who all work together to provide you with the care you need, when you need it.  We recommend signing up for the patient portal called "MyChart".  Sign up information is provided on this After Visit Summary.  MyChart is used to connect with patients for Virtual Visits (Telemedicine).  Patients are able to view lab/test results, encounter notes, upcoming appointments, etc.  Non-urgent messages can be sent to your provider as well.   To learn more about what you can do with MyChart, go to [email or web address].    Your next appointment:   6 month(s)  The format for your next appointment:   In Person  Provider:   [name], MD   Other Instructions  Bempedoic acid tablets What is this medicine? Bempedoic acid (BEM pe DOE ik AS id) is used to lower the level of cholesterol in the blood. It is used with other cholesterol-lowering drugs. This medicine may be used for other purposes; ask your health care provider or pharmacist if you have  questions. COMMON BRAND NAME(S): NEXLETOL What should I tell my health care provider before I take this medicine? They need to know if you have any of these conditions:  gout  kidney problems  liver problems  tendon problems  an unusual or allergic reaction to bempedoic acid, other medicines, foods, dyes, or preservatives  pregnant or trying to become pregnant  breast-feeding How should I use this medicine? Take this medicine by mouth with a glass of water. Follow the directions on the prescription label. You can take it with or without food. If it upsets your stomach, take it with food. Take your doses at regular intervals. Do not take your medicine more often than directed. Talk to your pediatrician about the use of this medicine in children. Special care may be needed. Overdosage: If you think you have taken too much of this medicine contact a poison control center or emergency room at once. NOTE: This medicine is only for you. Do not share this medicine with others. What if I miss a dose? If you miss a dose, take it as soon as you can. If it is almost time for your next dose, take only that dose. Do not take double or extra doses. What may interact with this medicine? This medicine may interact with the following medications:  simvastatin  pravastatin This list may not describe all possible interactions. Give your health care provider a list of all the medicines, herbs, non-prescription drugs, or dietary supplements you use. Also tell them if you smoke, drink  alcohol, or use illegal drugs. Some items may interact with your medicine. What should I watch for while using this medicine? Visit your health care professional for regular checks on your progress. Tell your health care professional if your symptoms do not start to get better or if they get worse. You may need blood work done while you are taking this medicine. This drug is only part of a total heart-health program. Your  doctor or a dietician can suggest a low-cholesterol and low-fat diet to help. Avoid alcohol and smoking, and keep a proper exercise schedule. What side effects may I notice from receiving this medicine? Side effects that you should report to your doctor or health care professional as soon as possible:  allergic reactions like skin rash, itching or hives, swelling of the face, lips, or tongue  signs of gout such as swollen, red, warm, or tender joints, especially in the toes  signs of tendon problems such as tendon pain or swelling or if you are unable to move a joint Side effects that usually do not require medical attention (report these to your doctor or health care professional if they continue or are bothersome):  back pain  cold or flu-like symptoms  muscle spasms  stomach pain This list may not describe all possible side effects. Call your doctor for medical advice about side effects. You may report side effects to FDA at 1-800-FDA-1088. Where should I keep my medicine? Keep out of the reach of children. Store at room temperature between 20 and 25 degrees C (68 and 77 degrees F). Keep this medicine in the original container. Do not throw out the packet in the container. It keeps the medicine dry. Throw away any unused medication after the expiration date. NOTE: This sheet is a summary. It may not cover all possible information. If you have questions about this medicine, talk to your doctor, pharmacist, or health care provider.  2021 Elsevier/Gold Standard (2019-01-21 16:19:16)

## 2020-12-21 NOTE — Progress Notes (Signed)
Cardiology Office Note:    Date:  12/21/2020   ID:  Corey [name], DOB 1961-04-15, MRN [id number]  PCP:  [name], DO  Cardiologist:  [name], MD    Referring MD: [name]*   Chief Complaint  Patient presents with  . Follow-up  I am doing fine  History of Present Illness:    Corey [name] is a 60 y.o. male with past medical history significant for coronary artery disease in July 2020 he required PTCA and stenting to the circumflex artery.  His past medical history also significant for essential hypertension dyslipidemia.  He comes to our office for follow-up today.  Last time of seeing him it was shortly after his wife passed because of complication from Covid and he still grieving after that he said life is difficult but he is able to cope with that.  Described to have 1 episode of chest pain/epigastric pain that happen yesterday after he ate dinner.  He also said that yesterday he was taking more out of some equipment it was very hard and heavy and the pain actually is reproducible on pressing chest wall.  Clearly musculoskeletal.  Otherwise no exertional chest pain tightness squeezing pressure burning chest.  Past Medical History:  Diagnosis Date  . Angina pectoris (HCC) 06/04/2019  . Arthritis   . Benign essential HTN 02/10/2016  . Bronchospasm with bronchitis, acute 12/14/2013  . Coronary artery disease status post PTCA and drug-eluting stent to circumflex artery in July 2020 06/26/2019  . Dyslipidemia 09/24/2020  . GAD (generalized anxiety disorder) 12/14/2013  . GERD (gastroesophageal reflux disease)    occ  . History of kidney stones   . Hypertension 12/14/2013  . Hypogonadism male 12/14/2013  . Kidney stones 02/10/2016  . Myelopathy (HCC) 06/03/2014  . Obesity   . OSA (obstructive sleep apnea) 02/10/2016  . Shortness of breath    occ-allergies  . Sleep apnea    cpap 16 yrs    Past Surgical History:  Procedure Laterality Date  .  ANTERIOR CERVICAL DECOMP/DISCECTOMY FUSION N/A 06/03/2014   Procedure: ANTERIOR CERVICAL DECOMPRESSION FUSION, CERVICAL FIVE-SIX, CERVICAL SIX-SEVEN WITH INSTRUMENTATION AND ALLOGRAFT;  Surgeon: Emilee Hero, MD;  Location: [facility] OR;  Service: Orthopedics;  Laterality: N/A;  . CORONARY STENT INTERVENTION N/A 06/19/2019   Procedure: CORONARY STENT INTERVENTION;  Surgeon: Swaziland, Peter M, MD;  Location: [facility] INVASIVE CV LAB;  Service: Cardiovascular;  Laterality: N/A;  . I & D EXTREMITY Left 11/10/2018   Procedure: IRRIGATION AND DEBRIDEMENT EXTREMITY, REVISION AMPUTATION OF LEFT RING FINGER;  Surgeon: Mack Hook, MD;  Location: [facility] OR;  Service: Orthopedics;  Laterality: Left;  . LEFT HEART CATH AND CORONARY ANGIOGRAPHY N/A 06/19/2019   Procedure: LEFT HEART CATH AND CORONARY ANGIOGRAPHY;  Surgeon: Swaziland, Peter M, MD;  Location: [facility] INVASIVE CV LAB;  Service: Cardiovascular;  Laterality: N/A;  . URETHRAL DILATION     child    Current Medications: Current Meds  Medication Sig  . acetaminophen (TYLENOL) 500 MG tablet Take 1,000 mg by mouth daily.  [name] albuterol (VENTOLIN HFA) 108 (90 Base) MCG/ACT inhaler Inhale 2 puffs into the lungs every 6 (six) hours as needed for wheezing or shortness of breath.  . ALPRAZolam (XANAX) 1 MG tablet Take 0.5-1 tablets (0.5-1 mg total) by mouth 3 (three) times daily as needed for anxiety or sleep.  [name] aspirin EC 81 MG tablet Take 1 tablet (81 mg total) by mouth daily.  . Bempedoic Acid (NEXLETOL) 180 MG TABS  Take 180 mg by mouth daily.  . celecoxib (CELEBREX) 200 MG capsule Take 200 mg by mouth daily.  . Cholecalciferol (VITAMIN D3) 1.25 MG (50000 UT) TABS Take 5,000 Units by mouth daily.  [name] ezetimibe (ZETIA) 10 MG tablet Take 1 tablet (10 mg total) by mouth daily.  . furosemide (LASIX) 20 MG tablet Take 1 tablet by mouth once daily  . levocetirizine (XYZAL) 5 MG tablet Take 1 tablet (5 mg total) by mouth every evening.  [name] losartan-hydrochlorothiazide (HYZAAR)  100-25 MG tablet Take 1 tablet by mouth daily.  . nitroGLYCERIN (NITROSTAT) 0.4 MG SL tablet Place 1 tablet (0.4 mg total) under the tongue every 5 (five) minutes as needed for chest pain.  . Omega-3 Fatty Acids (OMEGA-3 FISH OIL PO) Take 1 tablet by mouth daily.  . ranolazine (RANEXA) 1000 MG SR tablet TAKE 1  BY MOUTH TWICE DAILY     Allergies:   Atorvastatin and Crestor [rosuvastatin]   Social History   Socioeconomic History  . Marital status: Married    Spouse name: Not on file  . Number of children: Not on file  . Years of education: Not on file  . Highest education level: Not on file  Occupational History  . Not on file  Tobacco Use  . Smoking status: Former Smoker    Packs/day: 2.00    Years: 30.00    Pack years: 60.00    Types: Cigarettes    Quit date: 05/24/2012    Years since quitting: 8.5  . Smokeless tobacco: Never Used  Vaping Use  . Vaping Use: Never used  Substance and Sexual Activity  . Alcohol use: No  . Drug use: No  . Sexual activity: Not on file  Other Topics Concern  . Not on file  Social History Narrative  . Not on file   Social Determinants of Health   Financial Resource Strain: Not on file  Food Insecurity: Not on file  Transportation Needs: Not on file  Physical Activity: Not on file  Stress: Not on file  Social Connections: Not on file     Family History: The patient's family history includes Arrhythmia in his mother; CAD (age of onset: 28) in his father; Heart failure in his mother. ROS:   Please see the history of present illness.    All 14 point review of systems negative except as described per history of present illness  EKGs/Labs/Other Studies Reviewed:      Recent Labs: No results found for requested labs within last 8760 hours.  Recent Lipid Panel    Component Value Date/Time   CHOL 176 03/18/2020 1556   TRIG 221 (H) 03/18/2020 1556   HDL 42 03/18/2020 1556   CHOLHDL 4.2 03/18/2020 1556   LDLCALC 96 03/18/2020 1556    LDLDIRECT 108 (H) 03/18/2020 1556    Physical Exam:    VS:  BP (!) 158/82 (BP Location: Right Arm, Patient Position: Sitting, Cuff Size: Normal)   Pulse 66   Ht 5\' 5"  (1.651 m)   Wt 238 lb (108 kg)   SpO2 94%   BMI 39.61 kg/m     Wt Readings from Last 3 Encounters:  12/21/20 238 lb (108 kg)  09/30/20 225 lb (102.1 kg)  09/28/20 224 lb (101.6 kg)     GEN:  Well nourished, well developed in no acute distress HEENT: Normal NECK: No JVD; No carotid bruits LYMPHATICS: No lymphadenopathy CARDIAC: RRR, no murmurs, no rubs, no gallops RESPIRATORY:  Clear to auscultation without  rales, wheezing or rhonchi  ABDOMEN: Soft, non-tender, non-distended MUSCULOSKELETAL:  No edema; No deformity  SKIN: Warm and dry LOWER EXTREMITIES: no swelling NEUROLOGIC:  Alert and oriented x 3 PSYCHIATRIC:  Normal affect   ASSESSMENT:    1. Coronary artery disease involving native coronary artery of native heart without angina pectoris   2. Benign essential HTN   3. Shortness of breath   4. Dyslipidemia    PLAN:    In order of problems listed above:  1. Coronary disease stable from that point review of Brilinta more than 1 year.  Continue antiplatelet therapy but only monotherapy for now. 2. Essential hypertension slightly elevated today but he tells me at home always good.  We will continue present management. 3. Dyslipidemia his cholesterol still not well controlled he is only on Zetia I will add next clinical 180 to his medical regimen.  Cholesterol be checked within the next few weeks. 4. Shortness of breath improved. 5. We did talk about healthy lifestyle need to exercise on the regular basis which she will do.   Medication Adjustments/Labs and Tests Ordered: Current medicines are reviewed at length with the patient today.  Concerns regarding medicines are outlined above.  No orders of the defined types were placed in this encounter.  Medication changes:  Meds ordered this encounter   Medications  . Bempedoic Acid (NEXLETOL) 180 MG TABS    Sig: Take 180 mg by mouth daily.    Dispense:  30 tablet    Refill:  5    Signed, Georgeanna Lea, MD, [facility] 12/21/2020 5:01 PM    [facility]

## 2021-01-16 ENCOUNTER — Other Ambulatory Visit: Payer: Self-pay | Admitting: Family Medicine

## 2021-01-16 DIAGNOSIS — F411 Generalized anxiety disorder: Secondary | ICD-10-CM

## 2021-01-16 NOTE — Telephone Encounter (Signed)
Last refill  #90 with 2 refills on 07/29/2020 Last OV---09/19/2020 No UDS/CSC

## 2021-01-18 ENCOUNTER — Emergency Department (HOSPITAL_BASED_OUTPATIENT_CLINIC_OR_DEPARTMENT_OTHER): Payer: BC Managed Care – PPO

## 2021-01-18 ENCOUNTER — Emergency Department (HOSPITAL_BASED_OUTPATIENT_CLINIC_OR_DEPARTMENT_OTHER)
Admission: EM | Admit: 2021-01-18 | Discharge: 2021-01-18 | Disposition: A | Discharge status: Home / Self Care | Payer: BC Managed Care – PPO | Attending: Emergency Medicine | Admitting: Emergency Medicine

## 2021-01-18 ENCOUNTER — Encounter (HOSPITAL_BASED_OUTPATIENT_CLINIC_OR_DEPARTMENT_OTHER): Payer: Self-pay

## 2021-01-18 ENCOUNTER — Telehealth: Payer: Self-pay | Admitting: Cardiology

## 2021-01-18 ENCOUNTER — Other Ambulatory Visit: Payer: Self-pay

## 2021-01-18 DIAGNOSIS — R079 Chest pain, unspecified: Secondary | ICD-10-CM | POA: Diagnosis not present

## 2021-01-18 DIAGNOSIS — Z79899 Other long term (current) drug therapy: Secondary | ICD-10-CM | POA: Diagnosis not present

## 2021-01-18 DIAGNOSIS — Z7982 Long term (current) use of aspirin: Secondary | ICD-10-CM | POA: Diagnosis not present

## 2021-01-18 DIAGNOSIS — K219 Gastro-esophageal reflux disease without esophagitis: Secondary | ICD-10-CM | POA: Insufficient documentation

## 2021-01-18 DIAGNOSIS — I119 Hypertensive heart disease without heart failure: Secondary | ICD-10-CM | POA: Insufficient documentation

## 2021-01-18 DIAGNOSIS — I2510 Atherosclerotic heart disease of native coronary artery without angina pectoris: Secondary | ICD-10-CM | POA: Insufficient documentation

## 2021-01-18 DIAGNOSIS — Z87891 Personal history of nicotine dependence: Secondary | ICD-10-CM | POA: Insufficient documentation

## 2021-01-18 DIAGNOSIS — R0789 Other chest pain: Secondary | ICD-10-CM | POA: Diagnosis not present

## 2021-01-18 LAB — BASIC METABOLIC PANEL
Anion gap: 10 (ref 5–15)
BUN: 18 mg/dL (ref 6–20)
CO2: 25 mmol/L (ref 22–32)
Calcium: 9.2 mg/dL (ref 8.9–10.3)
Chloride: 102 mmol/L (ref 98–111)
Creatinine, Ser: 0.97 mg/dL (ref 0.61–1.24)
GFR, Estimated: >60 mL/min (ref >60)
Glucose, Bld: 106 mg/dL — ABNORMAL HIGH (ref 70–99)
Potassium: 3.7 mmol/L (ref 3.5–5.1)
Sodium: 137 mmol/L (ref 135–145)

## 2021-01-18 LAB — CBC
HCT: 42.8 % (ref 39.0–52.0)
Hemoglobin: 14.7 g/dL (ref 13.0–17.0)
MCH: 31.7 pg (ref 26.0–34.0)
MCHC: 34.3 g/dL (ref 30.0–36.0)
MCV: 92.2 fL (ref 80.0–100.0)
Platelets: 301 10*3/uL (ref 150–400)
RBC: 4.64 MIL/uL (ref 4.22–5.81)
RDW: 12.7 % (ref 11.5–15.5)
WBC: 8.7 10*3/uL (ref 4.0–10.5)
nRBC: 0 % (ref 0.0–0.2)

## 2021-01-18 LAB — TROPONIN I (HIGH SENSITIVITY)
Troponin I (High Sensitivity): 5 ng/L (ref <18)
Troponin I (High Sensitivity): 5 ng/L (ref <18)

## 2021-01-18 MED ORDER — NITROGLYCERIN 0.4 MG SL SUBL: 0.4000 mg | SUBLINGUAL_TABLET | SUBLINGUAL | 0 refills | Status: AC | PRN

## 2021-01-18 NOTE — Telephone Encounter (Signed)
    Mardella Layman calling, pt is in [facility] ED right now and was advised by PA that pt needs to be seen this week by his cards. They need to make sure pt doesn't have any blockage. No available appt in [facility] and [place] office, Lillia Abed said pt needs to be seen this week.

## 2021-01-18 NOTE — Telephone Encounter (Signed)
Spoke to patient daughter. He is scheduled for tomorrow.

## 2021-01-18 NOTE — ED Triage Notes (Signed)
Pt c/o CP x 30 min-NAD-steady gait 

## 2021-01-18 NOTE — ED Provider Notes (Signed)
[facility] EMERGENCY DEPARTMENT Provider Note   CSN: [id number] Arrival date & time: 01/18/21  1242     History Chief Complaint  Patient presents with  . Chest Pain    Corey [name] is a 60 y.o. male with a past medical history significant for hypertension, CAD status post PTCA circumflex artery in July 2020, dyslipidemia, GAD, GERD, OSA on CPAP who presents to the ED for evaluation of chest pain.  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 Chest pain lasted 30 minutes with no treatment. Patient ran out of nitroglycerin, so was unable to take one.  Denies associated diaphoresis, shortness of breath, nausea, vomiting.  Describes chest pain as something "standing the middle of his chest". He is currently chest pain free upon initial evaluation and arrival to the ED. Takes ASA 81mg  daily, but no other blood thinners. Denies alcohol and tobacco use. Admits to smoking occasional marijuana. Denies any trauma to chest.  No recent illness.  No relation to exertion or change in position.  No fever or chills.  Patient denies history of blood clots, recent surgeries, recent long immobilizations, and hormonal treatment.  No lower extremity edema. No treatment prior to arrival. No aggravating or alleviating symptoms.   Reviewed.  Patient is being followed by Dr. Bing MatterKrasowski with cardiology.  He had a recent visit on 12/21/2020. At that appointment, Konrad DoloresLester all continue to be not well controlled and patient was placed on Nexletol.   History obtained from patient and past medical records. No interpreter used during encounter.   HPI: A 60 year old patient with a history of hypertension, hypercholesterolemia and obesity presents for evaluation of chest pain. Initial onset of pain was approximately 1-3 hours ago. The patient's chest pain is described as heaviness/pressure/tightness and  is not worse with exertion. The patient's chest pain is middle- or left-sided, is not well-localized, is not sharp and does radiate to the arms/jaw/neck. The patient does not complain of nausea and denies diaphoresis. The patient has a family history of coronary artery disease in a first-degree relative with onset less than age 60. The patient has no history of stroke, has no history of peripheral artery disease, has not smoked in the past 90 days and denies any history of treated diabetes.   Past Medical History:  Diagnosis Date  . Angina pectoris (HCC) 06/04/2019  . Arthritis   . Benign essential HTN 02/10/2016  . Bronchospasm with bronchitis, acute 12/14/2013  . Coronary artery disease status post PTCA and drug-eluting stent to circumflex artery in July 2020 06/26/2019  . Dyslipidemia 09/24/2020  . GAD (generalized anxiety disorder) 12/14/2013  . GERD (gastroesophageal reflux disease)    occ  . History of kidney stones   . Hypertension 12/14/2013  . Hypogonadism male 12/14/2013  . Kidney stones 02/10/2016  . Myelopathy (HCC) 06/03/2014  . Obesity   . OSA (obstructive sleep apnea) 02/10/2016  . Shortness of breath    occ-allergies  . Sleep apnea    cpap 16 yrs    Patient Active Problem List   Diagnosis Date Noted  . Dyslipidemia 09/24/2020  . Sleep apnea   . Shortness of breath   . Obesity   . History of kidney stones   . Arthritis   . Coronary artery disease status post PTCA and drug-eluting stent to circumflex artery in July 2020 06/26/2019  . Angina pectoris (HCC) 06/04/2019  .  Benign essential HTN 02/10/2016  . OSA (obstructive sleep apnea) 02/10/2016  . GERD (gastroesophageal reflux disease) 02/10/2016  . Kidney stones 02/10/2016  . Myelopathy (HCC) 06/03/2014  . Bronchospasm with bronchitis, acute 12/14/2013  . GAD (generalized anxiety disorder) 12/14/2013  . Hypertension 12/14/2013  . Hypogonadism male 12/14/2013    Past Surgical History:  Procedure Laterality Date  .  ANTERIOR CERVICAL DECOMP/DISCECTOMY FUSION N/A 06/03/2014   Procedure: ANTERIOR CERVICAL DECOMPRESSION FUSION, CERVICAL FIVE-SIX, CERVICAL SIX-SEVEN WITH INSTRUMENTATION AND ALLOGRAFT;  Surgeon: Emilee Hero, MD;  Location: [facility] OR;  Service: Orthopedics;  Laterality: N/A;  . CORONARY STENT INTERVENTION N/A 06/19/2019   Procedure: CORONARY STENT INTERVENTION;  Surgeon: Swaziland, Peter M, MD;  Location: [facility] INVASIVE CV LAB;  Service: Cardiovascular;  Laterality: N/A;  . I & D EXTREMITY Left 11/10/2018   Procedure: IRRIGATION AND DEBRIDEMENT EXTREMITY, REVISION AMPUTATION OF LEFT RING FINGER;  Surgeon: Mack Hook, MD;  Location: [facility] OR;  Service: Orthopedics;  Laterality: Left;  . LEFT HEART CATH AND CORONARY ANGIOGRAPHY N/A 06/19/2019   Procedure: LEFT HEART CATH AND CORONARY ANGIOGRAPHY;  Surgeon: Swaziland, Peter M, MD;  Location: [facility] INVASIVE CV LAB;  Service: Cardiovascular;  Laterality: N/A;  . URETHRAL DILATION     child       Family History  Problem Relation Age of Onset  . Arrhythmia Mother        afib  . Heart failure Mother   . CAD Father 35       CABG with redo    Social History   Tobacco Use  . Smoking status: Former Smoker    Packs/day: 2.00    Years: 30.00    Pack years: 60.00    Types: Cigarettes    Quit date: 05/24/2012    Years since quitting: 8.6  . Smokeless tobacco: Never Used  Vaping Use  . Vaping Use: Never used  Substance Use Topics  . Alcohol use: No  . Drug use: No    Home Medications Prior to Admission medications   Medication Sig Start Date End Date Taking? Authorizing Provider  acetaminophen (TYLENOL) 500 MG tablet Take 1,000 mg by mouth daily.   Yes [provider]  ALPRAZolam (XANAX) 1 MG tablet TAKE 1/2 TO 1 (ONE-HALF TO ONE) TABLET BY MOUTH THREE TIMES DAILY AS NEEDED FOR ANXIETY OR  SLEEP 01/16/21  Yes [name], DO  aspirin EC 81 MG tablet Take 1 tablet (81 mg total) by mouth daily. 06/04/19  Yes Georgeanna Lea, MD   celecoxib (CELEBREX) 200 MG capsule Take 200 mg by mouth daily. 01/06/20  Yes [provider]  Cholecalciferol (VITAMIN D3) 1.25 MG (50000 UT) TABS Take 5,000 Units by mouth daily.   Yes [provider]  furosemide (LASIX) 20 MG tablet Take 1 tablet by mouth once daily 10/03/20  Yes Georgeanna Lea, MD  levocetirizine (XYZAL) 5 MG tablet Take 1 tablet (5 mg total) by mouth every evening. 07/26/20  Yes Wendling, Jilda Roche, DO  losartan-hydrochlorothiazide (HYZAAR) 100-25 MG tablet Take 1 tablet by mouth daily. 07/25/20  Yes Azalee Course, PA  nitroGLYCERIN (NITROSTAT) 0.4 MG SL tablet Place 1 tablet (0.4 mg total) under the tongue every 5 (five) minutes as needed for chest pain. 01/18/21  Yes Aberman, Merla Riches, PA-C  Omega-3 Fatty Acids (OMEGA-3 FISH OIL PO) Take 1 tablet by mouth daily.   Yes [provider]  ranolazine (RANEXA) 1000 MG SR tablet TAKE 1  BY MOUTH TWICE DAILY 11/17/20  Yes Georgeanna Lea, MD  albuterol (VENTOLIN HFA) 108 (90 Base) MCG/ACT inhaler Inhale 2 puffs into the lungs every 6 (six) hours as needed for wheezing or shortness of breath. 07/28/20   [name], DO  Bempedoic Acid (NEXLETOL) 180 MG TABS Take 180 mg by mouth daily. 12/21/20   Georgeanna Lea, MD  ezetimibe (ZETIA) 10 MG tablet Take 1 tablet (10 mg total) by mouth daily. 09/22/20 12/21/20  Georgeanna Lea, MD  nitroGLYCERIN (NITROSTAT) 0.4 MG SL tablet Place 1 tablet (0.4 mg total) under the tongue every 5 (five) minutes as needed for chest pain. 06/04/19 09/30/20  Georgeanna Lea, MD    Allergies    Atorvastatin and Crestor [rosuvastatin]  Review of Systems   Review of Systems  Constitutional: Negative for chills and fever.  Respiratory: Negative for cough and shortness of breath.   Cardiovascular: Positive for chest pain (resolved). Negative for leg swelling.  Gastrointestinal: Negative for abdominal pain, nausea and vomiting.  All other systems reviewed  and are negative.   Physical Exam Updated Vital Signs BP 131/78 (BP Location: Right Arm)   Pulse 65   Temp 97.9 F (36.6 C) (Oral)   Resp 14   Ht 5\' 5"  (1.651 m)   Wt 106.1 kg   SpO2 97%   BMI 38.94 kg/m   Physical Exam Vitals and nursing note reviewed.  Constitutional:      General: He is not in acute distress.    Appearance: He is not ill-appearing.  HENT:     Head: Normocephalic.  Eyes:     Pupils: Pupils are equal, round, and reactive to light.  Cardiovascular:     Rate and Rhythm: Normal rate and regular rhythm.     Pulses: Normal pulses.     Heart sounds: Normal heart sounds. No murmur heard. No friction rub. No gallop.   Pulmonary:     Effort: Pulmonary effort is normal.     Breath sounds: Normal breath sounds.  Chest:     Comments: No anterior chest wall tenderness.  No crepitus or deformity. Abdominal:     General: Abdomen is flat. Bowel sounds are normal. There is no distension.     Palpations: Abdomen is soft.     Tenderness: There is no abdominal tenderness. There is no guarding or rebound.     Comments: No epigastric or right upper quadrant tenderness  Musculoskeletal:     Cervical back: Neck supple.     Comments: No lower extremity edema. Negative homan sign bilaterally.  Skin:    General: Skin is warm and dry.  Neurological:     General: No focal deficit present.     Mental Status: He is alert.  Psychiatric:        Mood and Affect: Mood normal.        Behavior: Behavior normal.     ED Results / Procedures / Treatments   Labs (all labs ordered are listed, but only abnormal results are displayed) Labs Reviewed  BASIC METABOLIC PANEL - Abnormal; Notable for the following components:      Result Value   Glucose, Bld 106 (*)    All other components within normal limits  CBC  TROPONIN I (HIGH SENSITIVITY)  TROPONIN I (HIGH SENSITIVITY)    EKG EKG Interpretation  Date/Time:  Wednesday January 18 2021 12:40:54 EST Ventricular Rate:   66 PR Interval:  134 QRS Duration: 90 QT Interval:  386 QTC Calculation: 404 R Axis:   43  Text Interpretation: Normal sinus rhythm Normal ECG No significant change since last tracing Confirmed by Gwyneth Sprout ([id number]) on 01/18/2021 1:43:50 PM   Radiology DG Chest 2 View  Result Date: 01/18/2021 CLINICAL DATA:  Chest pain EXAM: CHEST - 2 VIEW COMPARISON:  July 26, 2020 FINDINGS: Lungs are clear. Heart size and pulmonary vascularity are normal. No adenopathy. There is postoperative change in the lower cervical region. There is degenerative change in the thoracic spine. IMPRESSION: Lungs clear.  Heart size normal. Electronically Signed   By: Bretta Bang III M.D.   On: 01/18/2021 13:34    Procedures Procedures   Medications Ordered in ED Medications - No data to display  ED Course  I have reviewed the triage vital signs and the nursing notes.  Pertinent labs & imaging results that were available during my care of the patient were reviewed by me and considered in my medical decision making (see chart for details).    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  Patient is currently being followed by Dr. Bing Matter with cardiology.  Patient is currently chest pain free upon arrival to the ED and during my initial evaluation.  Upon arrival, stable vitals.  Patient in no acute distress and non-ill-appearing.  Physical exam reassuring.  No reproducible anterior chest wall tenderness.  No epigastric or right upper quadrant tenderness.  No lower extremity edema.  Negative Homans' sign bilaterally.  Routine labs ordered to rule out electrolyte abnormalities.  Troponin, EKG, and chest x-ray ordered to rule out cardiac etiology.    EKG personally reviewed which demonstrates normal sinus rhythm with no signs of acute ischemia.  No changes from previous EKG.  CBC unremarkable no leukocytosis and normal hemoglobin.  BMP significant for hyperglycemia at 106 with no anion gap.  Normal renal function.  No major electrolyte derangements.  Chest x-ray personally reviewed which is negative for any signs of pneumonia, pneumothorax, or widened mediastinum.  Delta troponin flat.  Low suspicion for ACS.  Presentation not concerning for dissection, PE/DVT.  Shared decision making in regards to cardiology consult with possible overnight observation; however, patient prefers to follow-up with his cardiologist tomorrow AM. Patient has a cardiology appointment scheduled for 9 AM tomorrow.  Will refill a few tablets of nitroglycerin. Patient advised to return to the ER if he develops chest pain again given his risk factors. Strict ED precautions discussed with patient. Patient states understanding and agrees to plan. Patient discharged home in no acute distress and stable vitals  Discussed case with Dr. Lynelle Doctor who agrees with assessment and plan.  Final Clinical Impression(s) / ED Diagnoses Final diagnoses:  Nonspecific chest pain    Rx / DC Orders ED Discharge Orders         Ordered    nitroGLYCERIN (NITROSTAT) 0.4 MG SL tablet  Every 5 min PRN        01/18/21 1638           Jesusita Oka 01/18/21 1708    Gwyneth Sprout, MD 01/22/21 1315

## 2021-01-18 NOTE — Discharge Instructions (Addendum)
As discussed, all of your labs are reassuring today.  Your cardiac markers were normal.  I have refilled your nitroglycerin.  Please follow-up with your cardiologist at your scheduled appointment tomorrow morning.  Return to the ER if your chest pain returns or any new or worsening symptoms.

## 2021-01-19 ENCOUNTER — Ambulatory Visit: Payer: BC Managed Care – PPO | Admitting: Cardiology

## 2021-01-19 ENCOUNTER — Encounter: Payer: Self-pay | Admitting: Cardiology

## 2021-01-19 VITALS — BP 128/76 | HR 64 | Ht 65.0 in | Wt 229.0 lb

## 2021-01-19 DIAGNOSIS — E785 Hyperlipidemia, unspecified: Secondary | ICD-10-CM | POA: Diagnosis not present

## 2021-01-19 DIAGNOSIS — R0602 Shortness of breath: Secondary | ICD-10-CM

## 2021-01-19 DIAGNOSIS — I10 Essential (primary) hypertension: Secondary | ICD-10-CM

## 2021-01-19 DIAGNOSIS — I209 Angina pectoris, unspecified: Secondary | ICD-10-CM | POA: Diagnosis not present

## 2021-01-19 DIAGNOSIS — I259 Chronic ischemic heart disease, unspecified: Secondary | ICD-10-CM

## 2021-01-19 DIAGNOSIS — I2510 Atherosclerotic heart disease of native coronary artery without angina pectoris: Secondary | ICD-10-CM | POA: Diagnosis not present

## 2021-01-19 MED ORDER — ISOSORBIDE MONONITRATE ER 30 MG PO TB24: 30.0000 mg | ORAL_TABLET | Freq: Every day | ORAL | 3 refills | Status: DC

## 2021-01-19 NOTE — Progress Notes (Signed)
Cardiology Office Note:    Date:  01/19/2021   ID:  Deantre, Bourdon 10-26-1961, MRN [id number]  PCP:  [name], DO  Cardiologist:  [name], MD    Referring MD: [name]*   Chief Complaint  Patient presents with  . Chest Pain    History of Present Illness:    Corey [name] is a 60 y.o. male   with past medical history significant for coronary artery disease in July 2020 he required PTCA and stenting to the circumflex artery.  His past medical history also significant for essential hypertension dyslipidemia He comes today 2 months of follow-up.  Yesterday he ended going to the emergency room.  Yesterday he was at work he was laying on the floor and pulling some branch and started developing chest tightness.  It was completely different to the sensation that he suffered from when we end up doing cardiac catheterization 2020.  He got up he start to walk around pain did not go away entire duration of the pain was about 20 minutes eventually he ended coming to the emergency room.  On the way to the emergency room pain subsided.  There was no shortness of breath there was no sweating associated with this sensation.  Work-up in the emergency room revealed normal EKG without new acute ST segment changes, troponin I high-sensitivity was 5x2 he was discharged home with instruction to follow-up with [place]. Overall he tells me that he is doing well he denies having a chest pain except that episode.  And we did talk in length about what to do with the situation.  Past Medical History:  Diagnosis Date  . Angina pectoris (HCC) 06/04/2019  . Arthritis   . Benign essential HTN 02/10/2016  . Bronchospasm with bronchitis, acute 12/14/2013  . Coronary artery disease status post PTCA and drug-eluting stent to circumflex artery in July 2020 06/26/2019  . Dyslipidemia 09/24/2020  . GAD (generalized anxiety disorder) 12/14/2013  . GERD (gastroesophageal reflux disease)     occ  . History of kidney stones   . Hypertension 12/14/2013  . Hypogonadism male 12/14/2013  . Kidney stones 02/10/2016  . Myelopathy (HCC) 06/03/2014  . Obesity   . OSA (obstructive sleep apnea) 02/10/2016  . Shortness of breath    occ-allergies  . Sleep apnea    cpap 16 yrs    Past Surgical History:  Procedure Laterality Date  . ANTERIOR CERVICAL DECOMP/DISCECTOMY FUSION N/A 06/03/2014   Procedure: ANTERIOR CERVICAL DECOMPRESSION FUSION, CERVICAL FIVE-SIX, CERVICAL SIX-SEVEN WITH INSTRUMENTATION AND ALLOGRAFT;  Surgeon: Emilee Hero, MD;  Location: [facility] OR;  Service: Orthopedics;  Laterality: N/A;  . CORONARY STENT INTERVENTION N/A 06/19/2019   Procedure: CORONARY STENT INTERVENTION;  Surgeon: Swaziland, Peter M, MD;  Location: [facility] INVASIVE CV LAB;  Service: Cardiovascular;  Laterality: N/A;  . I & D EXTREMITY Left 11/10/2018   Procedure: IRRIGATION AND DEBRIDEMENT EXTREMITY, REVISION AMPUTATION OF LEFT RING FINGER;  Surgeon: Mack Hook, MD;  Location: [facility] OR;  Service: Orthopedics;  Laterality: Left;  . LEFT HEART CATH AND CORONARY ANGIOGRAPHY N/A 06/19/2019   Procedure: LEFT HEART CATH AND CORONARY ANGIOGRAPHY;  Surgeon: Swaziland, Peter M, MD;  Location: [facility] INVASIVE CV LAB;  Service: Cardiovascular;  Laterality: N/A;  . URETHRAL DILATION     child    Current Medications: Current Meds  Medication Sig  . acetaminophen (TYLENOL) 500 MG tablet Take 1,000 mg by mouth daily.  [name] albuterol (VENTOLIN HFA) 108 (90 Base) MCG/ACT  inhaler Inhale 2 puffs into the lungs every 6 (six) hours as needed for wheezing or shortness of breath.  . ALPRAZolam (XANAX) 1 MG tablet TAKE 1/2 TO 1 (ONE-HALF TO ONE) TABLET BY MOUTH THREE TIMES DAILY AS NEEDED FOR ANXIETY OR  SLEEP  . aspirin EC 81 MG tablet Take 1 tablet (81 mg total) by mouth daily.  . Bempedoic Acid (NEXLETOL) 180 MG TABS Take 180 mg by mouth daily.  . celecoxib (CELEBREX) 200 MG capsule Take 200 mg by mouth daily.  . Cholecalciferol  (VITAMIN D3) 1.25 MG (50000 UT) TABS Take 5,000 Units by mouth daily.  [name] ezetimibe (ZETIA) 10 MG tablet Take 1 tablet (10 mg total) by mouth daily.  . furosemide (LASIX) 20 MG tablet Take 1 tablet by mouth once daily  . levocetirizine (XYZAL) 5 MG tablet Take 1 tablet (5 mg total) by mouth every evening. (Patient taking differently: Take 5 mg by mouth as needed for allergies.)  . losartan-hydrochlorothiazide (HYZAAR) 100-25 MG tablet Take 1 tablet by mouth daily.  . nitroGLYCERIN (NITROSTAT) 0.4 MG SL tablet Place 1 tablet (0.4 mg total) under the tongue every 5 (five) minutes as needed for chest pain.  . Omega-3 Fatty Acids (OMEGA-3 FISH OIL PO) Take 1 tablet by mouth daily.  . ranolazine (RANEXA) 1000 MG SR tablet TAKE 1  BY MOUTH TWICE DAILY  . [DISCONTINUED] nitroGLYCERIN (NITROSTAT) 0.4 MG SL tablet Place 1 tablet (0.4 mg total) under the tongue every 5 (five) minutes as needed for chest pain.     Allergies:   Atorvastatin and Crestor [rosuvastatin]   Social History   Socioeconomic History  . Marital status: Married    Spouse name: Not on file  . Number of children: Not on file  . Years of education: Not on file  . Highest education level: Not on file  Occupational History  . Not on file  Tobacco Use  . Smoking status: Former Smoker    Packs/day: 2.00    Years: 30.00    Pack years: 60.00    Types: Cigarettes    Quit date: 05/24/2012    Years since quitting: 8.6  . Smokeless tobacco: Never Used  Vaping Use  . Vaping Use: Never used  Substance and Sexual Activity  . Alcohol use: No  . Drug use: No  . Sexual activity: Not on file  Other Topics Concern  . Not on file  Social History Narrative  . Not on file   Social Determinants of Health   Financial Resource Strain: Not on file  Food Insecurity: Not on file  Transportation Needs: Not on file  Physical Activity: Not on file  Stress: Not on file  Social Connections: Not on file     Family History: The patient's  family history includes Arrhythmia in his mother; CAD (age of onset: 42) in his father; Heart failure in his mother. ROS:   Please see the history of present illness.    All 14 point review of systems negative except as described per history of present illness  EKGs/Labs/Other Studies Reviewed:      Recent Labs: 01/18/2021: BUN 18; Creatinine, Ser 0.97; Hemoglobin 14.7; Platelets 301; Potassium 3.7; Sodium 137  Recent Lipid Panel    Component Value Date/Time   CHOL 176 03/18/2020 1556   TRIG 221 (H) 03/18/2020 1556   HDL 42 03/18/2020 1556   CHOLHDL 4.2 03/18/2020 1556   LDLCALC 96 03/18/2020 1556   LDLDIRECT 108 (H) 03/18/2020 1556    Physical  Exam:    VS:  BP 128/76 (BP Location: Left Arm, Patient Position: Sitting)   Pulse 64   Ht 5\' 5"  (1.651 m)   Wt 229 lb (103.9 kg)   SpO2 98%   BMI 38.11 kg/m     Wt Readings from Last 3 Encounters:  01/19/21 229 lb (103.9 kg)  01/18/21 234 lb (106.1 kg)  12/21/20 238 lb (108 kg)     GEN:  Well nourished, well developed in no acute distress HEENT: Normal NECK: No JVD; No carotid bruits LYMPHATICS: No lymphadenopathy CARDIAC: RRR, no murmurs, no rubs, no gallops RESPIRATORY:  Clear to auscultation without rales, wheezing or rhonchi  ABDOMEN: Soft, non-tender, non-distended MUSCULOSKELETAL:  No edema; No deformity  SKIN: Warm and dry LOWER EXTREMITIES: no swelling NEUROLOGIC:  Alert and oriented x 3 PSYCHIATRIC:  Normal affect   ASSESSMENT:    1. Coronary artery disease involving native coronary artery of native heart without angina pectoris   2. Benign essential HTN   3. Angina pectoris (HCC)   4. Dyslipidemia   5. Shortness of breath    PLAN:    In order of problems listed above:  Coronary artery disease status post PTCA and stenting of the circumflex artery in 2020 with residual complete occlusion of right coronary artery with good collateralization.  Yesterday he ended up having chest pain that prompted him to the  emergency room.  Likely biochemical markers normal, EKG did not show any acute changes.  We had a long discussion about what to do with the situation.  I told him that in my opinion the best approach will be to do stress test to see if he does have ischemia coming from different territory then right coronary artery.  He initially declined he said he does not want to do it he said every single time I did stress the stress test comes fine.  Therefore we set that we should try medical therapy and see how he responds to that.  After that he want me to call his daughter which I did and we spoke to [place] his daughter probably for about Agios Therapon.  And we will do to convince him to have a stress test.  Therefore, the plan will be to do stress test to check and identify the area of ischemia.  In the meantime I will put him on Imdur 30 mg daily, he does have a prescription for nitroglycerin he was instructed to take it on as-needed basis. Essential hypertension blood pressure seems to be well controlled continue present management. Dyslipidemia: He is taking Zetia Nexletol.  We will make arrangements to have his fasting lipid profile rechecked.  He may require PCSK9 agent. Shortness of breath stable.  I will see him back in about 1 month  Medication Adjustments/Labs and Tests Ordered: Current medicines are reviewed at length with the patient today.  Concerns regarding medicines are outlined above.  No orders of the defined types were placed in this encounter.  Medication changes: No orders of the defined types were placed in this encounter.   Signed, , MD, [facility] 01/19/2021 9:45 AM    [facility]

## 2021-01-19 NOTE — Patient Instructions (Signed)
Medication Instructions:  No medication changes. *If you need a refill on your cardiac medications before your next appointment, please call your pharmacy*   Lab Work: None ordered If you have labs (blood work) drawn today and your tests are completely normal, you will receive your results only by: [name] MyChart Message (if you have MyChart) OR . A paper copy in the mail If you have any lab test that is abnormal or we need to change your treatment, we will call you to review the results.   Testing/Procedures:  Your physician has requested that you have a lexiscan myoview. For further information please visit [email or web address] Please follow instruction sheet, as given.  The test will take approximately 3 to 4 hours to complete; you may bring reading material.  If someone comes with you to your appointment, they will need to remain in the main lobby due to limited space in the testing area.   How to prepare for your Myocardial Perfusion Test: . Do not eat or drink 3 hours prior to your test, except you may have water. . Do not consume products containing caffeine (regular or decaffeinated) 12 hours prior to your test. (ex: coffee, chocolate, sodas, tea). . Do bring a list of your current medications with you.  If not listed below, you may take your medications as normal. . Do wear comfortable clothes (no dresses or overalls) and walking shoes, tennis shoes preferred (No heels or open toe shoes are allowed). . Do NOT wear cologne, perfume, aftershave, or lotions (deodorant is allowed). . If these instructions are not followed, your test will have to be rescheduled.    Follow-Up: At [facility], you and your health needs are our priority.  As part of our continuing mission to provide you with exceptional heart care, we have created designated Provider Care Teams.  These Care Teams include your primary Cardiologist (physician) and Advanced Practice Providers (APPs -  Physician Assistants  and Nurse Practitioners) who all work together to provide you with the care you need, when you need it.  We recommend signing up for the patient portal called "MyChart".  Sign up information is provided on this After Visit Summary.  MyChart is used to connect with patients for Virtual Visits (Telemedicine).  Patients are able to view lab/test results, encounter notes, upcoming appointments, etc.  Non-urgent messages can be sent to your provider as well.   To learn more about what you can do with MyChart, go to [email or web address].    Your next appointment:   1 month(s)  The format for your next appointment:   In Person  Provider:   [name], MD   Other Instructions  Cardiac Nuclear Scan  A cardiac nuclear scan is a test that is done to check the flow of blood to your heart. It is done when you are resting and when you are exercising. The test looks for problems such as:  Not enough blood reaching a portion of the heart.  The heart muscle not working as it should. You may need this test if:  You have heart disease.  You have had lab results that are not normal.  You have had heart surgery or a balloon procedure to open up blocked arteries (angioplasty).  You have chest pain.  You have shortness of breath. In this test, a special dye (tracer) is put into your bloodstream. The tracer will travel to your heart. A camera will then take pictures of your heart to see how the tracer  moves through your heart. This test is usually done at a hospital and takes 2-4 hours. Tell a doctor about:  Any allergies you have.  All medicines you are taking, including vitamins, herbs, eye drops, creams, and over-the-counter medicines.  Any problems you or family members have had with anesthetic medicines.  Any blood disorders you have.  Any surgeries you have had.  Any medical conditions you have.  Whether you are pregnant or may be pregnant. What are the risks? Generally,  this is a safe test. However, problems may occur, such as:  Serious chest pain and heart attack. This is only a risk if the stress portion of the test is done.  Rapid heartbeat.  A feeling of warmth in your chest. This feeling usually does not last long.  Allergic reaction to the tracer. What happens before the test?  Ask your doctor about changing or stopping your normal medicines. This is important.  Follow instructions from your doctor about what you cannot eat or drink.  Remove your jewelry on the day of the test. What happens during the test? 1. An IV tube will be inserted into one of your veins. 2. Your doctor will give you a small amount of tracer through the IV tube. 3. You will wait for 20-40 minutes while the tracer moves through your bloodstream. 4. Your heart will be monitored with an electrocardiogram (ECG). 5. You will lie down on an exam table. 6. Pictures of your heart will be taken for about 15-20 minutes. 7. You may also have a stress test. For this test, one of these things may be done: ? You will be asked to exercise on a treadmill or a stationary bike. ? You will be given medicines that will make your heart work harder. This is done if you are unable to exercise. 8. When blood flow to your heart has peaked, a tracer will again be given through the IV tube. 9. After 20-40 minutes, you will get back on the exam table. More pictures will be taken of your heart. 10. Depending on the tracer that is used, more pictures may need to be taken 3-4 hours later. 11. Your IV tube will be removed when the test is over. The test may vary among doctors and hospitals. What happens after the test? 1. Ask your doctor: ? Whether you can return to your normal schedule, including diet, activities, and medicines. ? Whether you should drink more fluids. This will help to remove the tracer from your body. Drink enough fluid to keep your pee (urine) pale yellow. 2. Ask your doctor, or  the department that is doing the test: ? When will my results be ready? ? How will I get my results? Summary  A cardiac nuclear scan is a test that is done to check the flow of blood to your heart.  Tell your doctor whether you are pregnant or may be pregnant.  Before the test, ask your doctor about changing or stopping your normal medicines. This is important.  Ask your doctor whether you can return to your normal activities. You may be asked to drink more fluids. This information is not intended to replace advice given to you by your health care provider. Make sure you discuss any questions you have with your health care provider. Document Revised: 03/04/2019 Document Reviewed: 04/28/2018 Elsevier Patient Education  2020 [employer].

## 2021-01-31 ENCOUNTER — Telehealth: Payer: Self-pay | Admitting: *Deleted

## 2021-01-31 DIAGNOSIS — E782 Mixed hyperlipidemia: Secondary | ICD-10-CM | POA: Diagnosis not present

## 2021-01-31 NOTE — Telephone Encounter (Signed)
Left message on voicemail per DPR in reference to upcoming appointment scheduled on 02/02/21 at 0815 with detailed instructions given per Myocardial Perfusion Study Information Sheet for the test. LM to arrive 15 minutes early, and that it is imperative to arrive on time for appointment to keep from having the test rescheduled. If you need to cancel or reschedule your appointment, please call the office within 24 hours of your appointment. Failure to do so may result in a cancellation of your appointment, and a $50 no show fee. Phone number given for call back for any questions. Jun Rightmyer, Adelene Idler

## 2021-02-01 LAB — LIPOPROTEIN A (LPA): Lipoprotein (a): 109.8 nmol/L — ABNORMAL HIGH (ref <75.0)

## 2021-02-01 LAB — LIPID PANEL
Chol/HDL Ratio: 4.6 ratio (ref 0.0–5.0)
Cholesterol, Total: 172 mg/dL (ref 100–199)
HDL: 37 mg/dL — ABNORMAL LOW (ref >39)
LDL Chol Calc (NIH): 108 mg/dL — ABNORMAL HIGH (ref 0–99)
Triglycerides: 149 mg/dL (ref 0–149)
VLDL Cholesterol Cal: 27 mg/dL (ref 5–40)

## 2021-02-02 ENCOUNTER — Other Ambulatory Visit: Payer: Self-pay

## 2021-02-02 ENCOUNTER — Ambulatory Visit (INDEPENDENT_AMBULATORY_CARE_PROVIDER_SITE_OTHER): Payer: BC Managed Care – PPO

## 2021-02-02 DIAGNOSIS — I209 Angina pectoris, unspecified: Secondary | ICD-10-CM | POA: Diagnosis not present

## 2021-02-02 DIAGNOSIS — R0602 Shortness of breath: Secondary | ICD-10-CM | POA: Diagnosis not present

## 2021-02-02 DIAGNOSIS — I2510 Atherosclerotic heart disease of native coronary artery without angina pectoris: Secondary | ICD-10-CM

## 2021-02-02 DIAGNOSIS — I259 Chronic ischemic heart disease, unspecified: Secondary | ICD-10-CM | POA: Diagnosis not present

## 2021-02-02 LAB — MYOCARDIAL PERFUSION IMAGING
LV dias vol: 136 mL (ref 62–150)
LV sys vol: 62 mL
Peak HR: 81 {beats}/min
Rest HR: 51 {beats}/min
SDS: 4
SRS: 2
SSS: 6
TID: 1.15

## 2021-02-02 MED ORDER — TECHNETIUM TC 99M TETROFOSMIN IV KIT
32.3000 | PACK | Freq: Once | INTRAVENOUS | Status: AC | PRN
  Administered 2021-02-02: 32.3 via INTRAVENOUS

## 2021-02-02 MED ORDER — TECHNETIUM TC 99M TETROFOSMIN IV KIT
10.8000 | PACK | Freq: Once | INTRAVENOUS | Status: AC | PRN
  Administered 2021-02-02: 10.8 via INTRAVENOUS

## 2021-02-02 MED ORDER — REGADENOSON 0.4 MG/5ML IV SOLN
0.4000 mg | Freq: Once | INTRAVENOUS | Status: AC
  Administered 2021-02-02: 0.4 mg via INTRAVENOUS

## 2021-02-17 ENCOUNTER — Other Ambulatory Visit: Payer: Self-pay

## 2021-02-17 ENCOUNTER — Ambulatory Visit: Payer: BC Managed Care – PPO | Admitting: Cardiology

## 2021-02-17 ENCOUNTER — Encounter: Payer: Self-pay | Admitting: Cardiology

## 2021-02-17 VITALS — BP 124/82 | HR 62 | Ht 65.0 in | Wt 232.0 lb

## 2021-02-17 DIAGNOSIS — I10 Essential (primary) hypertension: Secondary | ICD-10-CM | POA: Diagnosis not present

## 2021-02-17 DIAGNOSIS — N529 Male erectile dysfunction, unspecified: Secondary | ICD-10-CM

## 2021-02-17 DIAGNOSIS — E785 Hyperlipidemia, unspecified: Secondary | ICD-10-CM

## 2021-02-17 DIAGNOSIS — I2510 Atherosclerotic heart disease of native coronary artery without angina pectoris: Secondary | ICD-10-CM | POA: Diagnosis not present

## 2021-02-17 MED ORDER — ISOSORBIDE MONONITRATE ER 60 MG PO TB24: 60.0000 mg | ORAL_TABLET | Freq: Every day | ORAL | 1 refills | Status: DC

## 2021-02-17 NOTE — Patient Instructions (Signed)
Medication Instructions:  Your physician has recommended you make the following change in your medication:  INCREASE: imdur 60 mg daily   *If you need a refill on your cardiac medications before your next appointment, please call your pharmacy*   Lab Work: None If you have labs (blood work) drawn today and your tests are completely normal, you will receive your results only by: [name] MyChart Message (if you have MyChart) OR . A paper copy in the mail If you have any lab test that is abnormal or we need to change your treatment, we will call you to review the results.   Testing/Procedures: None   Follow-Up: At [facility], you and your health needs are our priority.  As part of our continuing mission to provide you with exceptional heart care, we have created designated Provider Care Teams.  These Care Teams include your primary Cardiologist (physician) and Advanced Practice Providers (APPs -  Physician Assistants and Nurse Practitioners) who all work together to provide you with the care you need, when you need it.  We recommend signing up for the patient portal called "MyChart".  Sign up information is provided on this After Visit Summary.  MyChart is used to connect with patients for Virtual Visits (Telemedicine).  Patients are able to view lab/test results, encounter notes, upcoming appointments, etc.  Non-urgent messages can be sent to your provider as well.   To learn more about what you can do with MyChart, go to [email or web address].    Your next appointment:   3 month(s)  The format for your next appointment:   In Person  Provider:   [name], MD   Other Instructions

## 2021-02-17 NOTE — Progress Notes (Signed)
Cardiology Office Note:    Date:  02/17/2021   ID:  Corey [name], DOB 1961/01/27, MRN [id number]  PCP:  [name], DO  Cardiologist:  [name], MD    Referring MD: [name]*   Chief Complaint  Patient presents with  . Follow-up  I am doing better  History of Present Illness:    Corey [name] is a 60 y.o. male with past medical history significant for coronary artery disease.  In July 2020 he required PTCA and stenting to the circumflex artery for 99% stenosis.  At the same time he was find to have completely occluded right coronary artery with good collateralization.  He also have essential hypertension as well as dyslipidemia.  He came to me last time in about a month ago complaining of one episode of chest pain he was working very hard in the garden he actually ended going to the emergency room.  Biochemical markers were negative he was sent to [place] for follow-up.  He ended up having a stress test done and test show ischemia involving inferior wall which is in correlation to his completely occluded right coronary artery.  I did put him on appropriate medication he is doing clinically much better.  He described only one episode of chest pain he got upset with his daughter.  Past Medical History:  Diagnosis Date  . Angina pectoris (HCC) 06/04/2019  . Arthritis   . Benign essential HTN 02/10/2016  . Bronchospasm with bronchitis, acute 12/14/2013  . Coronary artery disease status post PTCA and drug-eluting stent to circumflex artery in July 2020 06/26/2019  . Dyslipidemia 09/24/2020  . GAD (generalized anxiety disorder) 12/14/2013  . GERD (gastroesophageal reflux disease)    occ  . History of kidney stones   . Hypertension 12/14/2013  . Hypogonadism male 12/14/2013  . Kidney stones 02/10/2016  . Myelopathy (HCC) 06/03/2014  . Obesity   . OSA (obstructive sleep apnea) 02/10/2016  . Shortness of breath    occ-allergies  . Sleep apnea    cpap 16 yrs     Past Surgical History:  Procedure Laterality Date  . ANTERIOR CERVICAL DECOMP/DISCECTOMY FUSION N/A 06/03/2014   Procedure: ANTERIOR CERVICAL DECOMPRESSION FUSION, CERVICAL FIVE-SIX, CERVICAL SIX-SEVEN WITH INSTRUMENTATION AND ALLOGRAFT;  Surgeon: Emilee Hero, MD;  Location: [facility] OR;  Service: Orthopedics;  Laterality: N/A;  . CORONARY STENT INTERVENTION N/A 06/19/2019   Procedure: CORONARY STENT INTERVENTION;  Surgeon: Swaziland, Peter M, MD;  Location: [facility] INVASIVE CV LAB;  Service: Cardiovascular;  Laterality: N/A;  . I & D EXTREMITY Left 11/10/2018   Procedure: IRRIGATION AND DEBRIDEMENT EXTREMITY, REVISION AMPUTATION OF LEFT RING FINGER;  Surgeon: Mack Hook, MD;  Location: [facility] OR;  Service: Orthopedics;  Laterality: Left;  . LEFT HEART CATH AND CORONARY ANGIOGRAPHY N/A 06/19/2019   Procedure: LEFT HEART CATH AND CORONARY ANGIOGRAPHY;  Surgeon: Swaziland, Peter M, MD;  Location: [facility] INVASIVE CV LAB;  Service: Cardiovascular;  Laterality: N/A;  . URETHRAL DILATION     child    Current Medications: Current Meds  Medication Sig  . acetaminophen (TYLENOL) 500 MG tablet Take 1,000 mg by mouth daily.  [name] albuterol (VENTOLIN HFA) 108 (90 Base) MCG/ACT inhaler Inhale 2 puffs into the lungs every 6 (six) hours as needed for wheezing or shortness of breath.  . ALPRAZolam (XANAX) 1 MG tablet TAKE 1/2 TO 1 (ONE-HALF TO ONE) TABLET BY MOUTH THREE TIMES DAILY AS NEEDED FOR ANXIETY OR  SLEEP  . aspirin EC 81  MG tablet Take 1 tablet (81 mg total) by mouth daily.  . Bempedoic Acid (NEXLETOL) 180 MG TABS Take 180 mg by mouth daily.  . celecoxib (CELEBREX) 200 MG capsule Take 200 mg by mouth daily.  . Cholecalciferol (VITAMIN D3) 1.25 MG (50000 UT) TABS Take 5,000 Units by mouth daily.  [name] ezetimibe (ZETIA) 10 MG tablet Take 1 tablet (10 mg total) by mouth daily.  . furosemide (LASIX) 20 MG tablet Take 1 tablet by mouth once daily  . isosorbide mononitrate (IMDUR) 30 MG 24 hr tablet Take 1 tablet (30  mg total) by mouth daily.  [name] levocetirizine (XYZAL) 5 MG tablet Take 5 mg by mouth as needed for allergies.  [name] losartan-hydrochlorothiazide (HYZAAR) 100-25 MG tablet Take 1 tablet by mouth daily.  . nitroGLYCERIN (NITROSTAT) 0.4 MG SL tablet Place 1 tablet (0.4 mg total) under the tongue every 5 (five) minutes as needed for chest pain.  . Omega-3 Fatty Acids (OMEGA-3 FISH OIL PO) Take 1 tablet by mouth daily.  . ranolazine (RANEXA) 1000 MG SR tablet TAKE 1  BY MOUTH TWICE DAILY     Allergies:   Atorvastatin and Crestor [rosuvastatin]   Social History   Socioeconomic History  . Marital status: Married    Spouse name: Not on file  . Number of children: Not on file  . Years of education: Not on file  . Highest education level: Not on file  Occupational History  . Not on file  Tobacco Use  . Smoking status: Former Smoker    Packs/day: 2.00    Years: 30.00    Pack years: 60.00    Types: Cigarettes    Quit date: 05/24/2012    Years since quitting: 8.7  . Smokeless tobacco: Never Used  Vaping Use  . Vaping Use: Never used  Substance and Sexual Activity  . Alcohol use: No  . Drug use: No  . Sexual activity: Not on file  Other Topics Concern  . Not on file  Social History Narrative  . Not on file   Social Determinants of Health   Financial Resource Strain: Not on file  Food Insecurity: Not on file  Transportation Needs: Not on file  Physical Activity: Not on file  Stress: Not on file  Social Connections: Not on file     Family History: The patient's family history includes Arrhythmia in his mother; CAD (age of onset: 79) in his father; Heart failure in his mother. ROS:   Please see the history of present illness.    All 14 point review of systems negative except as described per history of present illness  EKGs/Labs/Other Studies Reviewed:      Recent Labs: 01/18/2021: BUN 18; Creatinine, Ser 0.97; Hemoglobin 14.7; Platelets 301; Potassium 3.7; Sodium 137  Recent  Lipid Panel    Component Value Date/Time   CHOL 172 01/31/2021 0806   TRIG 149 01/31/2021 0806   HDL 37 (L) 01/31/2021 0806   CHOLHDL 4.6 01/31/2021 0806   LDLCALC 108 (H) 01/31/2021 0806   LDLDIRECT 108 (H) 03/18/2020 1556    Physical Exam:    VS:  BP 124/82 (BP Location: Left Arm, Patient Position: Sitting, Cuff Size: Normal)   Pulse 62   Ht 5\' 5"  (1.651 m)   Wt 232 lb (105.2 kg)   SpO2 94%   BMI 38.61 kg/m     Wt Readings from Last 3 Encounters:  02/17/21 232 lb (105.2 kg)  02/02/21 229 lb (103.9 kg)  01/19/21 229 lb (  103.9 kg)     GEN:  Well nourished, well developed in no acute distress HEENT: Normal NECK: No JVD; No carotid bruits LYMPHATICS: No lymphadenopathy CARDIAC: RRR, no murmurs, no rubs, no gallops RESPIRATORY:  Clear to auscultation without rales, wheezing or rhonchi  ABDOMEN: Soft, non-tender, non-distended MUSCULOSKELETAL:  No edema; No deformity  SKIN: Warm and dry LOWER EXTREMITIES: no swelling NEUROLOGIC:  Alert and oriented x 3 PSYCHIATRIC:  Normal affect   ASSESSMENT:    1. Coronary artery disease involving native coronary artery of native heart without angina pectoris   2. Benign essential HTN   3. Dyslipidemia    PLAN:    In order of problems listed above:  1. Coronary disease completely occluded right coronary artery, stress test showing ischemia in that territory.  He is already on ranolazine which I will continue, I will increase dose of Imdur from 30 to 60 mg daily. 2. Benign essential hypertension his blood pressure seems to be well controlled today I will continue present management. 3. Dyslipidemia I did review his K PN which show me his LDL of 108 and HDL 37 clearly not sufficiently controlled.  He does have difficulty tolerating statin he is on Nexletol as well as Zetia in spite of that his cholesterol is not at desirable level.  I did talk to him about PCSK9 agent.  He is reluctant to do that he prefers to try diet and exercise  first.  I will give him 3 months to see if he can show some improvement in his cholesterol profile if not will consider PCSK9 agent.  He has been already seen by lipid clinic.   Medication Adjustments/Labs and Tests Ordered: Current medicines are reviewed at length with the patient today.  Concerns regarding medicines are outlined above.  No orders of the defined types were placed in this encounter.  Medication changes: No orders of the defined types were placed in this encounter.   Signed, Georgeanna Lea, MD, [facility] 02/17/2021 4:17 PM    [facility]

## 2021-04-04 DIAGNOSIS — G4733 Obstructive sleep apnea (adult) (pediatric): Secondary | ICD-10-CM | POA: Diagnosis not present

## 2021-04-16 ENCOUNTER — Other Ambulatory Visit: Payer: Self-pay | Admitting: Family Medicine

## 2021-04-16 DIAGNOSIS — F411 Generalized anxiety disorder: Secondary | ICD-10-CM

## 2021-04-17 NOTE — Telephone Encounter (Signed)
OK to phone in, please sched CPE. Ty.

## 2021-04-17 NOTE — Telephone Encounter (Signed)
Called informed the patient and faxed to pharmacy. Patient is currently sick//scheduled virtual visit on 04/18/2021.

## 2021-04-18 ENCOUNTER — Other Ambulatory Visit: Payer: Self-pay

## 2021-04-18 ENCOUNTER — Telehealth (INDEPENDENT_AMBULATORY_CARE_PROVIDER_SITE_OTHER): Payer: BC Managed Care – PPO | Admitting: Family Medicine

## 2021-04-18 ENCOUNTER — Encounter: Payer: Self-pay | Admitting: Family Medicine

## 2021-04-18 DIAGNOSIS — F411 Generalized anxiety disorder: Secondary | ICD-10-CM | POA: Diagnosis not present

## 2021-04-18 DIAGNOSIS — J40 Bronchitis, not specified as acute or chronic: Secondary | ICD-10-CM

## 2021-04-18 MED ORDER — AMOXICILLIN-POT CLAVULANATE 875-125 MG PO TABS: 1.0000 | ORAL_TABLET | Freq: Two times a day (BID) | ORAL | 0 refills | Status: AC

## 2021-04-18 MED ORDER — PREDNISONE 20 MG PO TABS: 40.0000 mg | ORAL_TABLET | Freq: Every day | ORAL | 0 refills | Status: AC

## 2021-04-18 MED ORDER — PROMETHAZINE-DM 6.25-15 MG/5ML PO SYRP: 5.0000 mL | ORAL_SOLUTION | Freq: Four times a day (QID) | ORAL | 0 refills | Status: DC | PRN

## 2021-04-18 NOTE — Progress Notes (Signed)
Chief Complaint  Patient presents with  . Sore Throat    Congestion   . Diarrhea    Subjective Corey [name] presents for f/u anxiety. Due to COVID-19 pandemic, we are interacting via web portal for an electronic face-to-face visit. I verified patient's ID using 2 identifiers. Patient agreed to proceed with visit via this method. Patient is at home, I am at office. Patient and I are present for visit.  Pt is currently being treated with Xanax 0.25-0.5 mg qhs prn.  Reports doing well since treatment. No thoughts of harming self or others. No self-medication with alcohol, prescription drugs. Pt is not following with a counselor/psychologist.  URI Duration: 1 day  Associated symptoms: sinus congestion, rhinorrhea, sore throat, wheezing and cough Denies: sinus pain, ear fullness, ear pain, ear drainage, shortness of breath, myalgia and fevers Treatment to date: none Sick contacts: Yes  Past Medical History:  Diagnosis Date  . Angina pectoris (HCC) 06/04/2019  . Arthritis   . Benign essential HTN 02/10/2016  . Bronchospasm with bronchitis, acute 12/14/2013  . Coronary artery disease status post PTCA and drug-eluting stent to circumflex artery in July 2020 06/26/2019  . Dyslipidemia 09/24/2020  . GAD (generalized anxiety disorder) 12/14/2013  . GERD (gastroesophageal reflux disease)    occ  . History of kidney stones   . Hypertension 12/14/2013  . Hypogonadism male 12/14/2013  . Kidney stones 02/10/2016  . Myelopathy (HCC) 06/03/2014  . Obesity   . OSA (obstructive sleep apnea) 02/10/2016  . Shortness of breath    occ-allergies  . Sleep apnea    cpap 16 yrs   Allergies as of 04/18/2021      Reactions   Atorvastatin Other (See Comments)   Myalgias   Crestor [rosuvastatin] Other (See Comments)   Myalgias      Medication List       Accurate as of Apr 18, 2021  3:35 PM. If you have any questions, ask your nurse or doctor.        acetaminophen 500 MG tablet Commonly known as:  TYLENOL Take 1,000 mg by mouth daily.   albuterol 108 (90 Base) MCG/ACT inhaler Commonly known as: VENTOLIN HFA Inhale 2 puffs into the lungs every 6 (six) hours as needed for wheezing or shortness of breath.   ALPRAZolam 1 MG tablet Commonly known as: XANAX TAKE 1/2 TO 1 (ONE-HALF TO ONE) TABLET BY MOUTH AT BEDTIME AS NEEDED FOR ANXIETY OR  SLEEP   amoxicillin-clavulanate 875-125 MG tablet Commonly known as: AUGMENTIN Take 1 tablet by mouth 2 (two) times daily for 7 days. Start taking on: Apr 20, 2021 Started by: [name], DO   aspirin EC 81 MG tablet Take 1 tablet (81 mg total) by mouth daily.   celecoxib 200 MG capsule Commonly known as: CELEBREX Take 200 mg by mouth daily.   ezetimibe 10 MG tablet Commonly known as: ZETIA Take 1 tablet (10 mg total) by mouth daily.   furosemide 20 MG tablet Commonly known as: LASIX Take 1 tablet by mouth once daily   isosorbide mononitrate 60 MG 24 hr tablet Commonly known as: IMDUR Take 1 tablet (60 mg total) by mouth daily.   levocetirizine 5 MG tablet Commonly known as: XYZAL Take 5 mg by mouth as needed for allergies.   losartan-hydrochlorothiazide 100-25 MG tablet Commonly known as: HYZAAR Take 1 tablet by mouth daily.   Nexletol 180 MG Tabs Generic drug: Bempedoic Acid Take 180 mg by mouth daily.   nitroGLYCERIN 0.4  MG SL tablet Commonly known as: NITROSTAT Place 1 tablet (0.4 mg total) under the tongue every 5 (five) minutes as needed for chest pain.   OMEGA-3 FISH OIL PO Take 1 tablet by mouth daily.   predniSONE 20 MG tablet Commonly known as: DELTASONE Take 2 tablets (40 mg total) by mouth daily with breakfast for 5 days. Started by: [name], DO   promethazine-dextromethorphan 6.25-15 MG/5ML syrup Commonly known as: PROMETHAZINE-DM Take 5 mLs by mouth 4 (four) times daily as needed for cough. Started by: [name], DO   ranolazine 1000 MG SR tablet Commonly known  as: RANEXA TAKE 1  BY MOUTH TWICE DAILY   Vitamin D3 1.25 MG (50000 UT) Tabs Take 5,000 Units by mouth daily.       Exam No conversational dyspnea Age appropriate judgment and insight Nml affect and mood  Assessment and Plan  GAD (generalized anxiety disorder)  Wheezy bronchitis - Plan: promethazine-dextromethorphan (PROMETHAZINE-DM) 6.25-15 MG/5ML syrup, predniSONE (DELTASONE) 20 MG tablet, amoxicillin-clavulanate (AUGMENTIN) 875-125 MG tablet  1. Chronic, stable. Cont Xanax prn. CCS and UDS at next visit.  2. Pred burst for 5 d, 40 mg/d, Augmentin if no better.  F/u in 6 mo for CPE or prn. The patient voiced understanding and agreement to the plan.  Jilda Roche [place], DO 04/18/21 3:35 PM

## 2021-05-04 DIAGNOSIS — G4733 Obstructive sleep apnea (adult) (pediatric): Secondary | ICD-10-CM | POA: Diagnosis not present

## 2021-05-19 ENCOUNTER — Other Ambulatory Visit: Payer: Self-pay | Admitting: Cardiology

## 2021-05-19 ENCOUNTER — Other Ambulatory Visit: Payer: Self-pay | Admitting: Physician Assistant

## 2021-05-19 NOTE — Telephone Encounter (Signed)
Rx sent 

## 2021-05-25 ENCOUNTER — Ambulatory Visit: Payer: BC Managed Care – PPO | Admitting: Cardiology

## 2021-06-08 DIAGNOSIS — R5383 Other fatigue: Secondary | ICD-10-CM | POA: Diagnosis not present

## 2021-06-08 DIAGNOSIS — E785 Hyperlipidemia, unspecified: Secondary | ICD-10-CM | POA: Diagnosis not present

## 2021-06-08 DIAGNOSIS — I10 Essential (primary) hypertension: Secondary | ICD-10-CM | POA: Diagnosis not present

## 2021-06-08 DIAGNOSIS — G4700 Insomnia, unspecified: Secondary | ICD-10-CM | POA: Diagnosis not present

## 2021-06-08 DIAGNOSIS — E291 Testicular hypofunction: Secondary | ICD-10-CM | POA: Diagnosis not present

## 2021-06-09 DIAGNOSIS — R079 Chest pain, unspecified: Secondary | ICD-10-CM | POA: Diagnosis not present

## 2021-06-22 ENCOUNTER — Telehealth: Payer: Self-pay | Admitting: Family Medicine

## 2021-06-22 NOTE — Telephone Encounter (Signed)
Paperwork was left in front office   I mailed it to the patient

## 2021-07-06 ENCOUNTER — Other Ambulatory Visit: Payer: Self-pay | Admitting: Family Medicine

## 2021-07-06 DIAGNOSIS — F411 Generalized anxiety disorder: Secondary | ICD-10-CM

## 2021-07-06 NOTE — Telephone Encounter (Signed)
Requesting: ALPRAZOLAM Contract: NONE UDS: NONE Last Visit: 04/18/21 Next Visit: NONE Last Refill: 04/17/21  Please Advise

## 2021-08-30 ENCOUNTER — Other Ambulatory Visit: Payer: Self-pay | Admitting: Physician Assistant

## 2021-08-30 ENCOUNTER — Other Ambulatory Visit: Payer: Self-pay | Admitting: Cardiology

## 2021-08-31 NOTE — Telephone Encounter (Signed)
Furosemide 20 mg tablets # 90 only sent to  [facility], [place] - [address]

## 2021-09-04 ENCOUNTER — Other Ambulatory Visit: Payer: Self-pay

## 2021-09-04 ENCOUNTER — Telehealth: Payer: BC Managed Care – PPO | Admitting: Medical

## 2021-09-04 VITALS — Ht 65.0 in | Wt 204.0 lb

## 2021-09-04 DIAGNOSIS — U071 COVID-19: Secondary | ICD-10-CM

## 2021-09-04 MED ORDER — ALBUTEROL SULFATE HFA 108 (90 BASE) MCG/ACT IN AERS: 2.0000 | INHALATION_SPRAY | Freq: Four times a day (QID) | RESPIRATORY_TRACT | 0 refills | Status: DC | PRN

## 2021-09-04 MED ORDER — MOLNUPIRAVIR EUA 200MG CAPSULE: 4.0000 | ORAL_CAPSULE | Freq: Two times a day (BID) | ORAL | 0 refills | Status: AC

## 2021-09-04 MED ORDER — FLUTICASONE PROPIONATE 50 MCG/ACT NA SUSP: 2.0000 | Freq: Every day | NASAL | 1 refills | Status: AC

## 2021-09-04 NOTE — Patient Instructions (Addendum)
Covid infection mild to moderate symptoms in patient never vaccinated but did have covid one time last year.   Risk score 4. Good 02 sat. No cough reported. Not short of breath. Mild dry cough.  Was going to rx paxlovid but interaction with heart med prohibitive. So decided to rx monlupiravir instead.  Rx flonase for nasal congestion and refilled albuterol to use if needed.   If signs and symptoms worsen or change notifiy [place]. If severe signs or symptom change then ED evaluation.  Can see in office if needed. If sinus infection or bronchitis signs or symptoms occur let us know.  Hydrate well and tylenol for body aches.   Follow up 7 days or sooner if needed.

## 2021-09-04 NOTE — Progress Notes (Signed)
   Subjective:    Patient ID: Corey [name], male    DOB: 06-16-1961, 60 y.o.   MRN: [id number]  HPI   Virtual Visit via Telephone Note  I connected with Corey [name] on 09/04/21 at  1:40 PM EDT by telephone and verified that I am speaking with the correct person using two identifiers.  Location: Patient: home Provider: office   I discussed the limitations, risks, security and privacy concerns of performing an evaluation and management service by telephone and the availability of in person appointments. I also discussed with the patient that there may be a patient responsible charge related to this service. The patient expressed understanding and agreed to proceed.   History of Present Illness: Issues with video vist. Then called pt and  Pt recently diagnosed with covid on   Test used- + on Friday. Pt states week prior went to [place]. Mom had covid all last week. Pt wife passed away from covid last year.   Pt mom has covid. She is in his home and he taking care of his mom.  History of covid vaccine x 1  Pt never had covid vaccine in past.  Covid risk score- 4  History of covid infection-one time.  Last time had symptoms much worse compared to current signs/symptoms.  Current symptoms-pt has bodyaches, st, loose stools and fatigue.  Pt 02 sat-97%. But he states he is not short of breath. He is coughing minimally.   Pt has sleep apnea and asthma.   Pt gfr is greater than 60.    Pt is not on zetia, nexletol, nitro or imdur.      Observations/Objective:  Did see pt before decided to do by phone since audio went out. General-no acute distress, pleasant, oriented. Lungs- on inspection lungs appear unlabored. Neck- no tracheal deviation or jvd on inspection. Neuro- gross motor function appears intact.    Assessment and Plan:  Patient Instructions  Covid infection mild to moderate symptoms in patient never vaccinated but did have covid one time last year.    Risk score 4. Good 02 sat. No cough reported. Not short of breath. Mild dry cough.  Was going to rx paxlovid but interaction with heart med prohibitive. So decided to rx monlupiravir instead.  Rx flonase for nasal congestion and refilled albuterol to use if needed.   If signs and symptoms worsen or change notifiy [place]. If severe signs or symptom change then ED evaluation.  Can see in office if needed. If sinus infection or bronchitis signs or symptoms occur let us know,  Follow up 7 days or sooner if needed.   25 minutes non face to face time spent with pt.  Follow Up Instructions:    I discussed the assessment and treatment plan with the patient. The patient was provided an opportunity to ask questions and all were answered. The patient agreed with the plan and demonstrated an understanding of the instructions.   The patient was advised to call back or seek an in-person evaluation if the symptoms worsen or if the condition fails to improve as anticipated.     Esperanza Richters, PA-C  Review of Systems  Constitutional:  Negative for chills, fatigue and fever.      Objective:   Physical Exam        Assessment & Plan:

## 2021-09-12 ENCOUNTER — Other Ambulatory Visit: Payer: Self-pay | Admitting: Family Medicine

## 2021-09-12 DIAGNOSIS — F411 Generalized anxiety disorder: Secondary | ICD-10-CM

## 2021-09-12 NOTE — Telephone Encounter (Signed)
Last OV--04/18/21 Last RF--07/06/21--#90 no refills

## 2021-09-25 ENCOUNTER — Encounter: Payer: Self-pay | Admitting: Cardiology

## 2021-09-25 ENCOUNTER — Ambulatory Visit: Payer: BC Managed Care – PPO | Admitting: Cardiology

## 2021-09-25 ENCOUNTER — Other Ambulatory Visit: Payer: Self-pay

## 2021-09-25 VITALS — BP 112/70 | HR 60 | Ht 65.0 in | Wt 212.0 lb

## 2021-09-25 DIAGNOSIS — G4733 Obstructive sleep apnea (adult) (pediatric): Secondary | ICD-10-CM | POA: Diagnosis not present

## 2021-09-25 DIAGNOSIS — I10 Essential (primary) hypertension: Secondary | ICD-10-CM | POA: Diagnosis not present

## 2021-09-25 DIAGNOSIS — I2510 Atherosclerotic heart disease of native coronary artery without angina pectoris: Secondary | ICD-10-CM | POA: Diagnosis not present

## 2021-09-25 DIAGNOSIS — E785 Hyperlipidemia, unspecified: Secondary | ICD-10-CM

## 2021-09-25 MED ORDER — SILDENAFIL CITRATE 50 MG PO TABS: 50.0000 mg | ORAL_TABLET | Freq: Every day | ORAL | 3 refills | Status: DC | PRN

## 2021-09-25 NOTE — Patient Instructions (Signed)
Medication Instructions:  Your physician has recommended you make the following change in your medication:  Take as needed : Viagra 50 mg once a daily   DO NOT TAKE VIAGRA WITHIN 24 HOURS OF TAKING NITROGLYCERIN   *If you need a refill on your cardiac medications before your next appointment, please call your pharmacy*   Lab Work: Your physician recommends that you return for lab work today: lipid, lft  If you have labs (blood work) drawn today and your tests are completely normal, you will receive your results only by: MyChart Message (if you have MyChart) OR A paper copy in the mail If you have any lab test that is abnormal or we need to change your treatment, we will call you to review the results.   Testing/Procedures: None   Follow-Up: At [facility], you and your health needs are our priority.  As part of our continuing mission to provide you with exceptional heart care, we have created designated Provider Care Teams.  These Care Teams include your primary Cardiologist (physician) and Advanced Practice Providers (APPs -  Physician Assistants and Nurse Practitioners) who all work together to provide you with the care you need, when you need it.  We recommend signing up for the patient portal called "MyChart".  Sign up information is provided on this After Visit Summary.  MyChart is used to connect with patients for Virtual Visits (Telemedicine).  Patients are able to view lab/test results, encounter notes, upcoming appointments, etc.  Non-urgent messages can be sent to your provider as well.   To learn more about what you can do with MyChart, go to [email or web address].    Your next appointment:   6 month(s)  The format for your next appointment:   In Person  Provider:   [name], MD   Other Instructions  Sildenafil Tablets (Erectile Dysfunction) What is this medication? SILDENAFIL (sil DEN a fil) treats erectile dysfunction (ED). It works by increasing  blood flow to the penis, which helps to maintain an erection. This medicine may be used for other purposes; ask your health care provider or pharmacist if you have questions. COMMON BRAND NAME(S): Viagra What should I tell my care team before I take this medication? They need to know if you have any of these conditions: Bleeding disorders Eye or vision problems, including a rare inherited eye disease called retinitis pigmentosa Anatomical deformation of the penis, Peyronie's disease, or history of priapism (painful and prolonged erection) Heart disease, angina, a history of heart attack, irregular heartbeats, or other heart problems High or low blood pressure History of blood diseases, like sickle cell anemia or leukemia History of stomach bleeding Kidney disease Liver disease Stroke An unusual or allergic reaction to sildenafil, other medications, foods, dyes, or preservatives Pregnant or trying to get pregnant Breast-feeding How should I use this medication? Take this medication by mouth with a glass of water. Follow the directions on the prescription label. The dose is usually taken 1 hour before sexual activity. You should not take the dose more than once per day. Do not take your medication more often than directed. Talk to your care team about the use of this medication in children. This medication is not used in children for this condition. Overdosage: If you think you have taken too much of this medicine contact a poison control center or emergency room at once. NOTE: This medicine is only for you. Do not share this medicine with others. What if I miss a dose? This  does not apply. Do not take double or extra doses. What may interact with this medication? Do not take this medication with any of the following: Cisapride Nitrates like amyl nitrite, isosorbide dinitrate, isosorbide mononitrate, nitroglycerin Riociguat This medication may also interact with the following: Antiviral  medications for HIV or AIDS Bosentan Certain medications for benign prostatic hyperplasia (BPH) Certain medications for blood pressure Certain medications for fungal infections like ketoconazole and itraconazole Cimetidine Erythromycin Rifampin This list may not describe all possible interactions. Give your health care provider a list of all the medicines, herbs, non-prescription drugs, or dietary supplements you use. Also tell them if you smoke, drink alcohol, or use illegal drugs. Some items may interact with your medicine. What should I watch for while using this medication? If you notice any changes in your vision while taking this medication, call your care team as soon as possible. Stop using this medication and call your care team right away if you have a loss of sight in one or both eyes. Contact your care team right away if you have an erection that lasts longer than 4 hours or if it becomes painful. This may be a sign of a serious problem and must be treated right away to prevent permanent damage. If you experience symptoms of nausea, dizziness, chest pain or arm pain upon initiation of sexual activity after taking this medication, you should refrain from further activity and call your care team as soon as possible. Do not drink alcohol to excess (examples, 5 glasses of wine or 5 shots of whiskey) when taking this medication. When taken in excess, alcohol can increase your chances of getting a headache or getting dizzy, increasing your heart rate or lowering your blood pressure. Using this medication does not protect you or your partner against HIV infection (the virus that causes AIDS) or other sexually transmitted diseases. What side effects may I notice from receiving this medication? Side effects that you should report to your care team as soon as possible: Allergic reactions-skin rash, itching, hives, swelling of the face, lips, tongue, or throat Hearing loss or ringing in  ears Heart attack-pain or tightness in the chest, shoulders, arms, or jaw, nausea, shortness of breath, cold or clammy skin, feeling faint or lightheaded Heart rhythm changes-fast or irregular heartbeat, dizziness, feeling faint or lightheaded, chest pain, trouble breathing Low blood pressure-dizziness, feeling faint or lightheaded, blurry vision New or worsening shortness of breath Prolonged or painful erection Stroke-sudden numbness or weakness of the face, arm, or leg, trouble speaking, confusion, trouble walking, loss of balance or coordination, dizziness, severe headache, change in vision Sudden vision loss in one or both eyes Side effects that usually do not require medical attention (report to your care team if they continue or are bothersome): Facial flushing or redness Headache Nosebleed Runny or stuffy nose Trouble sleeping Upset stomach This list may not describe all possible side effects. Call your doctor for medical advice about side effects. You may report side effects to FDA at 1-800-FDA-1088. Where should I keep my medication? Keep out of reach of children and pets. Store at room temperature between 15 and 30 degrees C (59 and 86 degrees F). Throw away any unused medication after the expiration date. NOTE: This sheet is a summary. It may not cover all possible information. If you have questions about this medicine, talk to your doctor, pharmacist, or health care provider.  2022 Elsevier/Gold Standard (2020-12-15 13:57:21)

## 2021-09-25 NOTE — Progress Notes (Signed)
Cardiology Office Note:    Date:  09/25/2021   ID:  Corey [name], DOB Sep 06, 1961, MRN [id number]  PCP:  [name], DO  Cardiologist:  [name], MD    Referring MD: [name]*   Chief Complaint  Patient presents with   Follow-up  I am doing very well, I have no chest pain  History of Present Illness:    Corey [name] is a 60 y.o. male   with past medical history significant for coronary artery disease.  In July 2020 he required PTCA and stenting to the circumflex artery for 99% stenosis.  At the same time he was find to have completely occluded right coronary artery with good collateralization.  He also have essential hypertension as well as dyslipidemia.  He came to me last time in about a month ago complaining of one episode of chest pain he was working very hard in the garden he actually ended going to the emergency room.  Biochemical markers were negative he was sent to [place] for follow-up.  He ended up having a stress test done and test show ischemia involving inferior wall which is in correlation to his completely occluded right coronary artery.  I did put him on appropriate medication he is doing clinically much better Since I seen him last time he is doing very well.  He denies have any chest pain tightness squeezing pressure burning chest.  He works hard and have no difficulty doing it.  He stopped cholesterol medication he simply cannot tolerate it he was referred to lipid clinic he had conversation about PCSK9 agent but does not want to take it.  He also discontinue nitroglycerin long-acting but is asymptomatic.  Past Medical History:  Diagnosis Date   Angina pectoris (HCC) 06/04/2019   Arthritis    Benign essential HTN 02/10/2016   Bronchospasm with bronchitis, acute 12/14/2013   Coronary artery disease status post PTCA and drug-eluting stent to circumflex artery in July 2020 06/26/2019   Dyslipidemia 09/24/2020   GAD (generalized anxiety  disorder) 12/14/2013   GERD (gastroesophageal reflux disease)    occ   History of kidney stones    Hypertension 12/14/2013   Hypogonadism male 12/14/2013   Kidney stones 02/10/2016   Myelopathy (HCC) 06/03/2014   Obesity    OSA (obstructive sleep apnea) 02/10/2016   Shortness of breath    occ-allergies   Sleep apnea    cpap 16 yrs    Past Surgical History:  Procedure Laterality Date   ANTERIOR CERVICAL DECOMP/DISCECTOMY FUSION N/A 06/03/2014   Procedure: ANTERIOR CERVICAL DECOMPRESSION FUSION, CERVICAL FIVE-SIX, CERVICAL SIX-SEVEN WITH INSTRUMENTATION AND ALLOGRAFT;  Surgeon: Emilee Hero, MD;  Location: [facility] OR;  Service: Orthopedics;  Laterality: N/A;   CORONARY STENT INTERVENTION N/A 06/19/2019   Procedure: CORONARY STENT INTERVENTION;  Surgeon: Swaziland, Peter M, MD;  Location: [facility] INVASIVE CV LAB;  Service: Cardiovascular;  Laterality: N/A;   I & D EXTREMITY Left 11/10/2018   Procedure: IRRIGATION AND DEBRIDEMENT EXTREMITY, REVISION AMPUTATION OF LEFT RING FINGER;  Surgeon: Mack Hook, MD;  Location: [facility] OR;  Service: Orthopedics;  Laterality: Left;   LEFT HEART CATH AND CORONARY ANGIOGRAPHY N/A 06/19/2019   Procedure: LEFT HEART CATH AND CORONARY ANGIOGRAPHY;  Surgeon: Swaziland, Peter M, MD;  Location: [facility] INVASIVE CV LAB;  Service: Cardiovascular;  Laterality: N/A;   URETHRAL DILATION     child    Current Medications: Current Meds  Medication Sig   acetaminophen (TYLENOL) 500 MG tablet Take 1,000  mg by mouth daily.   albuterol (VENTOLIN HFA) 108 (90 Base) MCG/ACT inhaler Inhale 2 puffs into the lungs every 6 (six) hours as needed for wheezing or shortness of breath.   ALPRAZolam (XANAX) 1 MG tablet TAKE 1/2 TO 1 (ONE-HALF TO ONE) TABLET BY MOUTH THREE TIMES DAILY AS NEEDED FOR ANXIETY OR SLEEP (Patient taking differently: Take 0.5-1 mg by mouth 3 (three) times daily as needed for anxiety or sleep.)   aspirin EC 81 MG tablet Take 1 tablet (81 mg total) by mouth daily.   celecoxib  (CELEBREX) 200 MG capsule Take 200 mg by mouth daily.   fluticasone (FLONASE) 50 MCG/ACT nasal spray Place 2 sprays into both nostrils daily.   losartan-hydrochlorothiazide (HYZAAR) 100-25 MG tablet Take 1 tablet by mouth once daily (Patient taking differently: Take 1 tablet by mouth daily.)   nitroGLYCERIN (NITROSTAT) 0.4 MG SL tablet Place 1 tablet (0.4 mg total) under the tongue every 5 (five) minutes as needed for chest pain.   Omega-3 Fatty Acids (OMEGA-3 FISH OIL PO) Take 1 tablet by mouth daily.   ranolazine (RANEXA) 1000 MG SR tablet TAKE 1  BY MOUTH TWICE DAILY (Patient taking differently: Take 1,000 mg by mouth 2 (two) times daily.)     Allergies:   Atorvastatin and Crestor [rosuvastatin]   Social History   Socioeconomic History   Marital status: Married    Spouse name: Not on file   Number of children: Not on file   Years of education: Not on file   Highest education level: Not on file  Occupational History   Not on file  Tobacco Use   Smoking status: Former    Packs/day: 2.00    Years: 30.00    Pack years: 60.00    Types: Cigarettes    Quit date: 05/24/2012    Years since quitting: 9.3   Smokeless tobacco: Never  Vaping Use   Vaping Use: Never used  Substance and Sexual Activity   Alcohol use: No   Drug use: No   Sexual activity: Not on file  Other Topics Concern   Not on file  Social History Narrative   Not on file   Social Determinants of Health   Financial Resource Strain: Not on file  Food Insecurity: Not on file  Transportation Needs: Not on file  Physical Activity: Not on file  Stress: Not on file  Social Connections: Not on file     Family History: The patient's family history includes Arrhythmia in his mother; CAD (age of onset: 46) in his father; Heart failure in his mother. ROS:   Please see the history of present illness.    All 14 point review of systems negative except as described per history of present illness  EKGs/Labs/Other Studies  Reviewed:      Recent Labs: 01/18/2021: BUN 18; Creatinine, Ser 0.97; Hemoglobin 14.7; Platelets 301; Potassium 3.7; Sodium 137  Recent Lipid Panel    Component Value Date/Time   CHOL 172 01/31/2021 0806   TRIG 149 01/31/2021 0806   HDL 37 (L) 01/31/2021 0806   CHOLHDL 4.6 01/31/2021 0806   LDLCALC 108 (H) 01/31/2021 0806   LDLDIRECT 108 (H) 03/18/2020 1556    Physical Exam:    VS:  BP 112/70 (BP Location: Right Arm, Patient Position: Sitting)   Pulse 60   Ht 5\' 5"  (1.651 m)   Wt 212 lb (96.2 kg)   SpO2 92%   BMI 35.28 kg/m     Wt Readings from Last  3 Encounters:  09/25/21 212 lb (96.2 kg)  09/04/21 204 lb (92.5 kg)  02/17/21 232 lb (105.2 kg)     GEN:  Well nourished, well developed in no acute distress HEENT: Normal NECK: No JVD; No carotid bruits LYMPHATICS: No lymphadenopathy CARDIAC: RRR, no murmurs, no rubs, no gallops RESPIRATORY:  Clear to auscultation without rales, wheezing or rhonchi  ABDOMEN: Soft, non-tender, non-distended MUSCULOSKELETAL:  No edema; No deformity  SKIN: Warm and dry LOWER EXTREMITIES: no swelling NEUROLOGIC:  Alert and oriented x 3 PSYCHIATRIC:  Normal affect   ASSESSMENT:    1. Coronary artery disease involving native coronary artery of native heart without angina pectoris   2. OSA (obstructive sleep apnea)   3. Benign essential HTN   4. Dyslipidemia    PLAN:    In order of problems listed above:  Coronary artery disease doing very well from that point review continue present management decreased risk factors modifications Obstructive sleep apnea: Followed by antimedicine team Benign essential hypertension blood pressures well controlled continue present management Dyslipidemia he refused to take PCSK9 inhibitors but overall seems to be doing very well with diet and weight loss I will recheck his fasting lipid profile. Erectile dysfunction which is new problem.  We will give him prescription for Viagra I told him he cannot use  nitroglycerin when he is  He does not take Imdur   Medication Adjustments/Labs and Tests Ordered: Current medicines are reviewed at length with the patient today.  Concerns regarding medicines are outlined above.  No orders of the defined types were placed in this encounter.  Medication changes: No orders of the defined types were placed in this encounter.   Signed, Georgeanna Lea, MD, [facility] 09/25/2021 2:44 PM    [facility]

## 2021-09-26 LAB — HEPATIC FUNCTION PANEL
ALT: 10 IU/L (ref 0–44)
AST: 14 IU/L (ref 0–40)
Albumin: 4.5 g/dL (ref 3.8–4.9)
Alkaline Phosphatase: 86 IU/L (ref 44–121)
Bilirubin Total: 0.2 mg/dL (ref 0.0–1.2)
Bilirubin, Direct: <0.1 mg/dL (ref 0.00–0.40)
Total Protein: 7.1 g/dL (ref 6.0–8.5)

## 2021-09-26 LAB — LIPID PANEL
Chol/HDL Ratio: 3.9 ratio (ref 0.0–5.0)
Cholesterol, Total: 175 mg/dL (ref 100–199)
HDL: 45 mg/dL
LDL Chol Calc (NIH): 97 mg/dL (ref 0–99)
Triglycerides: 191 mg/dL — ABNORMAL HIGH (ref 0–149)
VLDL Cholesterol Cal: 33 mg/dL (ref 5–40)

## 2021-09-27 ENCOUNTER — Telehealth: Payer: Self-pay

## 2021-09-27 DIAGNOSIS — E785 Hyperlipidemia, unspecified: Secondary | ICD-10-CM

## 2021-09-27 MED ORDER — ROSUVASTATIN CALCIUM 10 MG PO TABS: 10.0000 mg | ORAL_TABLET | Freq: Every day | ORAL | 1 refills | Status: DC

## 2021-09-27 NOTE — Telephone Encounter (Signed)
-----   Message from Georgeanna Lea, MD sent at 09/26/2021  2:38 PM EDT ----- Call distress not sufficiently controlled, he need to be cholesterol medication.  I proposed to start Crestor 10 mg daily, AST ALT and fasting lipid profile in 6 weeks

## 2021-09-27 NOTE — Telephone Encounter (Signed)
Patient notified  of results and recommendations and agreed to plan. Rx sent. Lab orders on file

## 2021-10-05 ENCOUNTER — Other Ambulatory Visit: Payer: Self-pay | Admitting: Family Medicine

## 2021-10-05 DIAGNOSIS — F411 Generalized anxiety disorder: Secondary | ICD-10-CM

## 2021-10-06 NOTE — Telephone Encounter (Signed)
Called the patient and he is not in need of a refill now. He has not used #90 pills in the past month. He still has pills left and is doing well. He may take 1 at night, but not every night. Has no future appt. Scheduled as he is doing better. Saw his cardiologist this am

## 2021-10-06 NOTE — Telephone Encounter (Signed)
Last OV--04/18/21 Last RF--#90 on 09/12/2021 No CSC/UDS

## 2021-10-06 NOTE — Telephone Encounter (Signed)
Called the patient but he was unable to hear me. Will call back.

## 2021-10-06 NOTE — Telephone Encounter (Signed)
He's due to see me in a couple weeks, please sched. May need sooner if he went through 90 days in 3 weeks.

## 2021-12-04 ENCOUNTER — Other Ambulatory Visit: Payer: Self-pay | Admitting: Cardiology

## 2021-12-04 DIAGNOSIS — I2510 Atherosclerotic heart disease of native coronary artery without angina pectoris: Secondary | ICD-10-CM

## 2022-03-22 ENCOUNTER — Other Ambulatory Visit: Payer: Self-pay | Admitting: Cardiology

## 2022-03-27 ENCOUNTER — Encounter: Payer: Self-pay | Admitting: Cardiology

## 2022-03-27 ENCOUNTER — Ambulatory Visit: Payer: BC Managed Care – PPO | Admitting: Cardiology

## 2022-03-27 VITALS — BP 118/74 | HR 75 | Ht 65.0 in | Wt 229.0 lb

## 2022-03-27 DIAGNOSIS — I259 Chronic ischemic heart disease, unspecified: Secondary | ICD-10-CM | POA: Diagnosis not present

## 2022-03-27 DIAGNOSIS — G4733 Obstructive sleep apnea (adult) (pediatric): Secondary | ICD-10-CM

## 2022-03-27 DIAGNOSIS — E782 Mixed hyperlipidemia: Secondary | ICD-10-CM | POA: Diagnosis not present

## 2022-03-27 DIAGNOSIS — E785 Hyperlipidemia, unspecified: Secondary | ICD-10-CM

## 2022-03-27 DIAGNOSIS — I209 Angina pectoris, unspecified: Secondary | ICD-10-CM

## 2022-03-27 DIAGNOSIS — I2510 Atherosclerotic heart disease of native coronary artery without angina pectoris: Secondary | ICD-10-CM

## 2022-03-27 DIAGNOSIS — I10 Essential (primary) hypertension: Secondary | ICD-10-CM

## 2022-03-27 MED ORDER — FUROSEMIDE 20 MG PO TABS: 20.0000 mg | ORAL_TABLET | Freq: Every day | ORAL | 3 refills | Status: DC

## 2022-03-27 MED ORDER — EZETIMIBE 10 MG PO TABS: 10.0000 mg | ORAL_TABLET | Freq: Every day | ORAL | 3 refills | Status: DC

## 2022-03-27 NOTE — Patient Instructions (Signed)
Medication Instructions:  ?Your physician has recommended you make the following change in your medication: START: Zetia 10mg  1 tablet daily ?                    START: Lasix 20mg  1 tablet daily  ?*If you need a refill on your cardiac medications before your next appointment, please call your pharmacy* ? ? ?Lab Work: ? ?Your physician recommends that you return for lab work in: 6 Weeks ?You need to have labs done when you are fasting.  You can come Monday through Friday 8:30 am to 12:00 pm and 1:15 to 4:30. You do not need to make an appointment as the order has already been placed. The labs you are going to have done are BMET and Lipids.  ? ?If you have labs (blood work) drawn today and your tests are completely normal, you will receive your results only by: ?MyChart Message (if you have MyChart) OR ?A paper copy in the mail ?If you have any lab test that is abnormal or we need to change your treatment, we will call you to review the results. ? ? ?Testing/Procedures: ?None Ordered ? ? ?Follow-Up: ?At [facility], you and your health needs are our priority.  As part of our continuing mission to provide you with exceptional heart care, we have created designated Provider Care Teams.  These Care Teams include your primary Cardiologist (physician) and Advanced Practice Providers (APPs -  Physician Assistants and Nurse Practitioners) who all work together to provide you with the care you need, when you need it. ? ?We recommend signing up for the patient portal called "MyChart".  Sign up information is provided on this After Visit Summary.  MyChart is used to connect with patients for Virtual Visits (Telemedicine).  Patients are able to view lab/test results, encounter notes, upcoming appointments, etc.  Non-urgent messages can be sent to your provider as well.   ?To learn more about what you can do with MyChart, go to Saturday.   ? ?Your next appointment:   ?6 month(s) ? ?The format for your next  appointment:   ?In Person ? ?Provider:   ?[facility], MD  ? ? ?Other Instructions ?None ? ?Important Information About Sugar ? ? ? ? ? ? ?

## 2022-03-27 NOTE — Progress Notes (Signed)
?Cardiology Office Note:   ? ?Date:  03/27/2022  ? ?ID:  Corey [name], DOB Sep 18, 1961, MRN [id number] ? ?PCP:  Shelda Pal, DO  ?Cardiologist:  Jenne Campus, MD   ? ?Referring MD: Shelda Pal*  ? ?Chief Complaint  ?Patient presents with  ? Follow-up  ?Doing well ? ?History of Present Illness:   ? ?Corey [name] is a 61 y.o. male   with past medical history significant for coronary artery disease.  In July 2020 he required PTCA and stenting to the circumflex artery for 99% stenosis.  At the same time he was find to have completely occluded right coronary artery with good collateralization.  He also have essential hypertension as well as dyslipidemia.  He came to me last time in about a month ago complaining of one episode of chest pain he was working very hard in the garden he actually ended going to the emergency room.  Biochemical markers were negative he was sent to [place] for follow-up.  He ended up having a stress test done and test show ischemia involving inferior wall which is in correlation to his completely occluded right coronary artery.  I did put him on appropriate medication he is doing clinically much better. ?Comes today to my office for follow-up described very rare episode of chest pain that happen only when he got very upset I was thinking about augmenting his medical therapy but he said he is doing fine that way. ?He stopped taking statin because of muscle aches.  For him again lipid clinic he did not want to do it we will try Zetia first. ? ?Past Medical History:  ?Diagnosis Date  ? Angina pectoris ([place]) 06/04/2019  ? Arthritis   ? Benign essential HTN 02/10/2016  ? Bronchospasm with bronchitis, acute 12/14/2013  ? Coronary artery disease status post PTCA and drug-eluting stent to circumflex artery in July 2020 06/26/2019  ? Dyslipidemia 09/24/2020  ? GAD (generalized anxiety disorder) 12/14/2013  ? GERD (gastroesophageal reflux disease)   ? occ  ? History of kidney stones    ? Hypertension 12/14/2013  ? Hypogonadism male 12/14/2013  ? Kidney stones 02/10/2016  ? Myelopathy ([place]) 06/03/2014  ? Obesity   ? OSA (obstructive sleep apnea) 02/10/2016  ? Shortness of breath   ? occ-allergies  ? Sleep apnea   ? cpap 16 yrs  ? ? ?Past Surgical History:  ?Procedure Laterality Date  ? ANTERIOR CERVICAL DECOMP/DISCECTOMY FUSION N/A 06/03/2014  ? Procedure: ANTERIOR CERVICAL DECOMPRESSION FUSION, CERVICAL FIVE-SIX, CERVICAL SIX-SEVEN WITH INSTRUMENTATION AND ALLOGRAFT;  Surgeon: Sinclair Ship, MD;  Location: [facility];  Service: Orthopedics;  Laterality: N/A;  ? CORONARY STENT INTERVENTION N/A 06/19/2019  ? Procedure: CORONARY STENT INTERVENTION;  Surgeon: Martinique, Peter M, MD;  Location: [facility];  Service: Cardiovascular;  Laterality: N/A;  ? I & D EXTREMITY Left 11/10/2018  ? Procedure: IRRIGATION AND DEBRIDEMENT EXTREMITY, REVISION AMPUTATION OF LEFT RING FINGER;  Surgeon: Milly Jakob, MD;  Location: [facility];  Service: Orthopedics;  Laterality: Left;  ? LEFT HEART CATH AND CORONARY ANGIOGRAPHY N/A 06/19/2019  ? Procedure: LEFT HEART CATH AND CORONARY ANGIOGRAPHY;  Surgeon: Martinique, Peter M, MD;  Location: [facility];  Service: Cardiovascular;  Laterality: N/A;  ? URETHRAL DILATION    ? child  ? ? ?Current Medications: ?Current Meds  ?Medication Sig  ? acetaminophen (TYLENOL) 500 MG tablet Take 1,000 mg by mouth daily.  ? albuterol (VENTOLIN HFA) 108 (90 Base) MCG/ACT inhaler  Inhale 2 puffs into the lungs every 6 (six) hours as needed for wheezing or shortness of breath.  ? ALPRAZolam (XANAX) 1 MG tablet TAKE 1/2 TO 1 (ONE-HALF TO ONE) TABLET BY MOUTH THREE TIMES DAILY AS NEEDED FOR ANXIETY OR SLEEP  ? aspirin EC 81 MG tablet Take 1 tablet (81 mg total) by mouth daily.  ? celecoxib (CELEBREX) 200 MG capsule Take 200 mg by mouth daily.  ? ezetimibe (ZETIA) 10 MG tablet Take 1 tablet (10 mg total) by mouth daily.  ? fluticasone (FLONASE) 50 MCG/ACT nasal spray Place 2 sprays into both  nostrils daily.  ? furosemide (LASIX) 20 MG tablet Take 1 tablet (20 mg total) by mouth daily.  ? losartan-hydrochlorothiazide (HYZAAR) 100-25 MG tablet Take 1 tablet by mouth once daily (Patient taking differently: Take 1 tablet by mouth daily.)  ? nitroGLYCERIN (NITROSTAT) 0.4 MG SL tablet Place 1 tablet (0.4 mg total) under the tongue every 5 (five) minutes as needed for chest pain.  ? Omega-3 Fatty Acids (OMEGA-3 FISH OIL PO) Take 1 tablet by mouth daily.  ? ranolazine (RANEXA) 1000 MG SR tablet Take 1 tablet by mouth twice daily  ? sildenafil (VIAGRA) 50 MG tablet Take 1 tablet (50 mg total) by mouth daily as needed for erectile dysfunction.  ? [DISCONTINUED] rosuvastatin (CRESTOR) 10 MG tablet Take 1 tablet (10 mg total) by mouth daily.  ?  ? ?Allergies:   Atorvastatin and Rosuvastatin  ? ?Social History  ? ?Socioeconomic History  ? Marital status: Married  ?  Spouse name: Not on file  ? Number of children: Not on file  ? Years of education: Not on file  ? Highest education level: Not on file  ?Occupational History  ? Not on file  ?Tobacco Use  ? Smoking status: Former  ?  Packs/day: 2.00  ?  Years: 30.00  ?  Pack years: 60.00  ?  Types: Cigarettes  ?  Quit date: 05/24/2012  ?  Years since quitting: 9.8  ? Smokeless tobacco: Never  ?Vaping Use  ? Vaping Use: Never used  ?Substance and Sexual Activity  ? Alcohol use: No  ? Drug use: No  ? Sexual activity: Not on file  ?Other Topics Concern  ? Not on file  ?Social History Narrative  ? Not on file  ? ?Social Determinants of Health  ? ?Financial Resource Strain: Not on file  ?Food Insecurity: Not on file  ?Transportation Needs: Not on file  ?Physical Activity: Not on file  ?Stress: Not on file  ?Social Connections: Not on file  ?  ? ?Family History: ?The patient's family history includes Arrhythmia in his mother; CAD (age of onset: 44) in his father; Heart failure in his mother. ?ROS:   ?Please see the history of present illness.    ?All 14 point review of systems  negative except as described per history of present illness ? ?EKGs/Labs/Other Studies Reviewed:   ? ? ? ?Recent Labs: ?09/25/2021: ALT 10  ?Recent Lipid Panel ?   ?Component Value Date/Time  ? CHOL 175 09/25/2021 1502  ? TRIG 191 (H) 09/25/2021 1502  ? HDL 45 09/25/2021 1502  ? CHOLHDL 3.9 09/25/2021 1502  ? [place] 97 09/25/2021 1502  ? LDLDIRECT 108 (H) 03/18/2020 1556  ? ? ?Physical Exam:   ? ?VS:  BP 118/74 (BP Location: Left Arm, Patient Position: Sitting)   Pulse 75   Ht 5\' 5"  (1.651 m)   Wt 229 lb (103.9 kg)   SpO2 94%  BMI 38.11 kg/m?    ? ?Wt Readings from Last 3 Encounters:  ?03/27/22 229 lb (103.9 kg)  ?09/25/21 212 lb (96.2 kg)  ?09/04/21 204 lb (92.5 kg)  ?  ? ?GEN:  Well nourished, well developed in no acute distress ?HEENT: Normal ?NECK: No JVD; No carotid bruits ?LYMPHATICS: No lymphadenopathy ?CARDIAC: RRR, no murmurs, no rubs, no gallops ?RESPIRATORY:  Clear to auscultation without rales, wheezing or rhonchi  ?ABDOMEN: Soft, non-tender, non-distended ?MUSCULOSKELETAL:  No edema; No deformity  ?SKIN: Warm and dry ?LOWER EXTREMITIES: no swelling ?NEUROLOGIC:  Alert and oriented x 3 ?PSYCHIATRIC:  Normal affect  ? ?ASSESSMENT:   ? ?1. Angina pectoris ([place])   ?2. Chest pain due to myocardial ischemia, unspecified ischemic chest pain type   ?3. Mixed hyperlipidemia   ?4. Coronary artery disease involving native coronary artery of native heart without angina pectoris   ?5. Benign essential HTN   ?6. OSA (obstructive sleep apnea)   ?7. Dyslipidemia   ? ?PLAN:   ? ?In order of problems listed above: ? ?Coronary disease stable from that point review stable ?Angina pectoris that comes from completely occluded RCA he is on the ranolazine which I will continue. ?Dyslipidemia we will try Zetia see if he can tolerate it 7 again I offered him lipid clinic hopefully he will accept that offer ?Obstructive sleep apnea: That being followed by internal medicine team ?Chest pain: Denies having  any ? ? ?Medication Adjustments/Labs and Tests Ordered: ?Current medicines are reviewed at length with the patient today.  Concerns regarding medicines are outlined above.  ?Orders Placed This Encounter  ?Procedures  ? Lipi

## 2022-04-04 ENCOUNTER — Other Ambulatory Visit: Payer: Self-pay | Admitting: Cardiology

## 2022-04-04 DIAGNOSIS — I2510 Atherosclerotic heart disease of native coronary artery without angina pectoris: Secondary | ICD-10-CM

## 2022-04-30 DIAGNOSIS — D519 Vitamin B12 deficiency anemia, unspecified: Secondary | ICD-10-CM | POA: Diagnosis not present

## 2022-04-30 DIAGNOSIS — I10 Essential (primary) hypertension: Secondary | ICD-10-CM | POA: Diagnosis not present

## 2022-04-30 DIAGNOSIS — E291 Testicular hypofunction: Secondary | ICD-10-CM | POA: Diagnosis not present

## 2022-04-30 DIAGNOSIS — G4700 Insomnia, unspecified: Secondary | ICD-10-CM | POA: Diagnosis not present

## 2022-04-30 DIAGNOSIS — Z6839 Body mass index (BMI) 39.0-39.9, adult: Secondary | ICD-10-CM | POA: Diagnosis not present

## 2022-04-30 DIAGNOSIS — E785 Hyperlipidemia, unspecified: Secondary | ICD-10-CM | POA: Diagnosis not present

## 2022-06-07 DIAGNOSIS — I10 Essential (primary) hypertension: Secondary | ICD-10-CM | POA: Diagnosis not present

## 2022-06-07 DIAGNOSIS — E539 Vitamin B deficiency, unspecified: Secondary | ICD-10-CM | POA: Diagnosis not present

## 2022-06-07 DIAGNOSIS — F419 Anxiety disorder, unspecified: Secondary | ICD-10-CM | POA: Diagnosis not present

## 2022-07-09 DIAGNOSIS — Z79891 Long term (current) use of opiate analgesic: Secondary | ICD-10-CM | POA: Diagnosis not present

## 2022-07-09 DIAGNOSIS — Z5181 Encounter for therapeutic drug level monitoring: Secondary | ICD-10-CM | POA: Diagnosis not present

## 2022-07-09 DIAGNOSIS — I10 Essential (primary) hypertension: Secondary | ICD-10-CM | POA: Diagnosis not present

## 2022-07-09 DIAGNOSIS — E785 Hyperlipidemia, unspecified: Secondary | ICD-10-CM | POA: Diagnosis not present

## 2022-07-09 DIAGNOSIS — Z125 Encounter for screening for malignant neoplasm of prostate: Secondary | ICD-10-CM | POA: Diagnosis not present

## 2022-07-09 DIAGNOSIS — Z79899 Other long term (current) drug therapy: Secondary | ICD-10-CM | POA: Diagnosis not present

## 2022-10-30 DIAGNOSIS — E559 Vitamin D deficiency, unspecified: Secondary | ICD-10-CM | POA: Diagnosis not present

## 2022-10-30 DIAGNOSIS — Z720 Tobacco use: Secondary | ICD-10-CM | POA: Diagnosis not present

## 2022-10-30 DIAGNOSIS — R0989 Other specified symptoms and signs involving the circulatory and respiratory systems: Secondary | ICD-10-CM | POA: Diagnosis not present

## 2022-10-30 DIAGNOSIS — I2510 Atherosclerotic heart disease of native coronary artery without angina pectoris: Secondary | ICD-10-CM | POA: Diagnosis not present

## 2022-10-30 DIAGNOSIS — I10 Essential (primary) hypertension: Secondary | ICD-10-CM | POA: Diagnosis not present

## 2022-10-30 DIAGNOSIS — Z0100 Encounter for examination of eyes and vision without abnormal findings: Secondary | ICD-10-CM | POA: Diagnosis not present

## 2022-10-30 DIAGNOSIS — Z0001 Encounter for general adult medical examination with abnormal findings: Secondary | ICD-10-CM | POA: Diagnosis not present

## 2022-10-30 DIAGNOSIS — E785 Hyperlipidemia, unspecified: Secondary | ICD-10-CM | POA: Diagnosis not present

## 2022-10-30 DIAGNOSIS — Z136 Encounter for screening for cardiovascular disorders: Secondary | ICD-10-CM | POA: Diagnosis not present

## 2022-10-31 DIAGNOSIS — R799 Abnormal finding of blood chemistry, unspecified: Secondary | ICD-10-CM | POA: Diagnosis not present

## 2022-11-01 LAB — LAB REPORT - SCANNED: EGFR: 85.7

## 2022-11-13 ENCOUNTER — Other Ambulatory Visit: Payer: Self-pay | Admitting: Cardiology

## 2022-12-02 DIAGNOSIS — Z1212 Encounter for screening for malignant neoplasm of rectum: Secondary | ICD-10-CM | POA: Diagnosis not present

## 2022-12-02 DIAGNOSIS — Z1211 Encounter for screening for malignant neoplasm of colon: Secondary | ICD-10-CM | POA: Diagnosis not present

## 2022-12-18 ENCOUNTER — Ambulatory Visit: Payer: BC Managed Care – PPO | Attending: Cardiology | Admitting: Cardiology

## 2022-12-18 ENCOUNTER — Encounter: Payer: Self-pay | Admitting: Cardiology

## 2022-12-18 VITALS — BP 132/84 | HR 61 | Ht 65.0 in | Wt 227.5 lb

## 2022-12-18 DIAGNOSIS — I25110 Atherosclerotic heart disease of native coronary artery with unstable angina pectoris: Secondary | ICD-10-CM

## 2022-12-18 DIAGNOSIS — E785 Hyperlipidemia, unspecified: Secondary | ICD-10-CM | POA: Diagnosis not present

## 2022-12-18 DIAGNOSIS — G4733 Obstructive sleep apnea (adult) (pediatric): Secondary | ICD-10-CM | POA: Diagnosis not present

## 2022-12-18 DIAGNOSIS — I2510 Atherosclerotic heart disease of native coronary artery without angina pectoris: Secondary | ICD-10-CM

## 2022-12-18 DIAGNOSIS — I209 Angina pectoris, unspecified: Secondary | ICD-10-CM

## 2022-12-18 DIAGNOSIS — I10 Essential (primary) hypertension: Secondary | ICD-10-CM

## 2022-12-18 NOTE — Patient Instructions (Addendum)
Medication Instructions:  Your physician recommends that you continue on your current medications as directed. Please refer to the Current Medication list given to you today.  *If you need a refill on your cardiac medications before your next appointment, please call your pharmacy*   Lab Work: Lipid, AST, ALT- today If you have labs (blood work) drawn today and your tests are completely normal, you will receive your results only by: MyChart Message (if you have MyChart) OR A paper copy in the mail If you have any lab test that is abnormal or we need to change your treatment, we will call you to review the results.   Testing/Procedures: None Ordered   Follow-Up: At [facility], you and your health needs are our priority.  As part of our continuing mission to provide you with exceptional heart care, we have created designated Provider Care Teams.  These Care Teams include your primary Cardiologist (physician) and Advanced Practice Providers (APPs -  Physician Assistants and Nurse Practitioners) who all work together to provide you with the care you need, when you need it.  We recommend signing up for the patient portal called "MyChart".  Sign up information is provided on this After Visit Summary.  MyChart is used to connect with patients for Virtual Visits (Telemedicine).  Patients are able to view lab/test results, encounter notes, upcoming appointments, etc.  Non-urgent messages can be sent to your provider as well.   To learn more about what you can do with MyChart, go to [email or web address].    Your next appointment:   6 month(s)  The format for your next appointment:   In Person  Provider:   Robert Krasowski, MD    Other Instructions NA  

## 2022-12-18 NOTE — Addendum Note (Signed)
Addended by: Jacobo Forest D on: 12/18/2022 09:23 AM   Modules accepted: Orders

## 2022-12-18 NOTE — Progress Notes (Signed)
Cardiology Office Note:    Date:  12/18/2022   ID:  Corey [name], DOB Dec 30, 1960, MRN [id number]  PCP:  Shelda Pal, DO  Cardiologist:  Jenne Campus, MD    Referring MD: Shelda Pal*   Chief Complaint  Patient presents with   Follow-up    History of Present Illness:    Corey [name] is a 62 y.o. male    with past medical history significant for coronary artery disease.  In July 2020 he required PTCA and stenting to the circumflex artery for 99% stenosis.  At the same time he was find to have completely occluded right coronary artery with good collateralization.  He also have essential hypertension as well as dyslipidemia.  He came to me last time in about a month ago complaining of one episode of chest pain he was working very hard in the garden he actually ended going to the emergency room.  Biochemical markers were negative he was sent to [place] for follow-up.  He ended up having a stress test done and test show ischemia involving inferior wall which is in correlation to his completely occluded right coronary artery.  I did put him on appropriate medication he is doing clinically much better.  He is coming today to months for follow-up.  Cardiac wise doing well.  He denies have any chest pain tightness squeezing pressure burning chest no palpitation dizziness swelling of lower extremities he got some shortness of breath only when he climbs multiple flights of stairs.  No chest pain  Past Medical History:  Diagnosis Date   Angina pectoris ([place]) 06/04/2019   Arthritis    Benign essential HTN 02/10/2016   Bronchospasm with bronchitis, acute 12/14/2013   Coronary artery disease status post PTCA and drug-eluting stent to circumflex artery in July 2020 06/26/2019   Dyslipidemia 09/24/2020   GAD (generalized anxiety disorder) 12/14/2013   GERD (gastroesophageal reflux disease)    occ   History of kidney stones    Hypertension 12/14/2013   Hypogonadism male  12/14/2013   Kidney stones 02/10/2016   Myelopathy ([place]) 06/03/2014   Obesity    OSA (obstructive sleep apnea) 02/10/2016   Shortness of breath    occ-allergies   Sleep apnea    cpap 16 yrs    Past Surgical History:  Procedure Laterality Date   ANTERIOR CERVICAL DECOMP/DISCECTOMY FUSION N/A 06/03/2014   Procedure: ANTERIOR CERVICAL DECOMPRESSION FUSION, CERVICAL FIVE-SIX, CERVICAL SIX-SEVEN WITH INSTRUMENTATION AND ALLOGRAFT;  Surgeon: Sinclair Ship, MD;  Location: [facility];  Service: Orthopedics;  Laterality: N/A;   CORONARY STENT INTERVENTION N/A 06/19/2019   Procedure: CORONARY STENT INTERVENTION;  Surgeon: Martinique, Peter M, MD;  Location: [facility];  Service: Cardiovascular;  Laterality: N/A;   I & D EXTREMITY Left 11/10/2018   Procedure: IRRIGATION AND DEBRIDEMENT EXTREMITY, REVISION AMPUTATION OF LEFT RING FINGER;  Surgeon: Milly Jakob, MD;  Location: [place];  Service: Orthopedics;  Laterality: Left;   LEFT HEART CATH AND CORONARY ANGIOGRAPHY N/A 06/19/2019   Procedure: LEFT HEART CATH AND CORONARY ANGIOGRAPHY;  Surgeon: Martinique, Peter M, MD;  Location: [facility];  Service: Cardiovascular;  Laterality: N/A;   URETHRAL DILATION     child    Current Medications: Current Meds  Medication Sig   acetaminophen (TYLENOL) 500 MG tablet Take 1,000 mg by mouth daily.   albuterol (VENTOLIN HFA) 108 (90 Base) MCG/ACT inhaler Inhale 2 puffs into the lungs every 6 (six) hours as needed for wheezing  or shortness of breath.   ALPRAZolam (XANAX) 1 MG tablet TAKE 1/2 TO 1 (ONE-HALF TO ONE) TABLET BY MOUTH THREE TIMES DAILY AS NEEDED FOR ANXIETY OR SLEEP (Patient taking differently: Take 1 mg by mouth 3 (three) times daily as needed for anxiety.)   aspirin EC 81 MG tablet Take 1 tablet (81 mg total) by mouth daily.   celecoxib (CELEBREX) 200 MG capsule Take 200 mg by mouth daily.   ezetimibe (ZETIA) 10 MG tablet Take 1 tablet (10 mg total) by mouth daily.   fluticasone (FLONASE)  50 MCG/ACT nasal spray Place 2 sprays into both nostrils daily.   furosemide (LASIX) 20 MG tablet Take 1 tablet (20 mg total) by mouth daily.   losartan-hydrochlorothiazide (HYZAAR) 100-25 MG tablet Take 1 tablet by mouth once daily (Patient taking differently: Take 1 tablet by mouth daily.)   nitroGLYCERIN (NITROSTAT) 0.4 MG SL tablet Place 1 tablet (0.4 mg total) under the tongue every 5 (five) minutes as needed for chest pain.   Omega-3 Fatty Acids (OMEGA-3 FISH OIL PO) Take 1 tablet by mouth daily.   ranolazine (RANEXA) 1000 MG SR tablet Take 1 tablet by mouth twice daily (Patient taking differently: Take 500 mg by mouth 2 (two) times daily.)   sildenafil (VIAGRA) 50 MG tablet Take 1 tablet (50 mg total) by mouth daily as needed for erectile dysfunction.     Allergies:   Atorvastatin and Rosuvastatin   Social History   Socioeconomic History   Marital status: Married    Spouse name: Not on file   Number of children: Not on file   Years of education: Not on file   Highest education level: Not on file  Occupational History   Not on file  Tobacco Use   Smoking status: Former    Packs/day: 2.00    Years: 30.00    Total pack years: 60.00    Types: Cigarettes    Quit date: 05/24/2012    Years since quitting: 10.5   Smokeless tobacco: Never  Vaping Use   Vaping Use: Never used  Substance and Sexual Activity   Alcohol use: No   Drug use: No   Sexual activity: Not on file  Other Topics Concern   Not on file  Social History Narrative   Not on file   Social Determinants of Health   Financial Resource Strain: Not on file  Food Insecurity: Not on file  Transportation Needs: Not on file  Physical Activity: Not on file  Stress: Not on file  Social Connections: Not on file     Family History: The patient's family history includes Arrhythmia in his mother; CAD (age of onset: 62) in his father; Heart failure in his mother. ROS:   Please see the history of present illness.     All 14 point review of systems negative except as described per history of present illness  EKGs/Labs/Other Studies Reviewed:      Recent Labs: No results found for requested labs within last 365 days.  Recent Lipid Panel    Component Value Date/Time   CHOL 175 09/25/2021 1502   TRIG 191 (H) 09/25/2021 1502   HDL 45 09/25/2021 1502   CHOLHDL 3.9 09/25/2021 1502   LDLCALC 97 09/25/2021 1502   LDLDIRECT 108 (H) 03/18/2020 1556    Physical Exam:    VS:  BP 132/84 (BP Location: Left Arm, Patient Position: Sitting)   Pulse 61   Ht 5\' 5"  (1.651 m)   Wt 227 lb 8  oz (103.2 kg)   SpO2 92%   BMI 37.86 kg/m     Wt Readings from Last 3 Encounters:  12/18/22 227 lb 8 oz (103.2 kg)  03/27/22 229 lb (103.9 kg)  09/25/21 212 lb (96.2 kg)     GEN:  Well nourished, well developed in no acute distress HEENT: Normal NECK: No JVD; No carotid bruits LYMPHATICS: No lymphadenopathy CARDIAC: RRR, no murmurs, no rubs, no gallops RESPIRATORY:  Clear to auscultation without rales, wheezing or rhonchi  ABDOMEN: Soft, non-tender, non-distended MUSCULOSKELETAL:  No edema; No deformity  SKIN: Warm and dry LOWER EXTREMITIES: no swelling NEUROLOGIC:  Alert and oriented x 3 PSYCHIATRIC:  Normal affect   ASSESSMENT:    1. Coronary artery disease involving native coronary artery of native heart without angina pectoris   2. Benign essential HTN   3. Angina pectoris (H[facility])   4. OSA (obstructive sleep apnea)   5. Dyslipidemia    PLAN:    In order of problems listed above:  Coronary disease with known completely occluded right coronary artery, he is doing quite well clinically on appropriate medication which I will continue.  He denies have any chest pain just shortness of breath. Benign essential hypertension blood pressure well-controlled continue present management. Dyslipidemia I did review his K PN LDL 97 HDL 45 he is unable to tolerate statin he is only on Zetia, will recheck fasting  lipid profile today. Obstructive sleep apnea that being followed by antimedicine team   Medication Adjustments/Labs and Tests Ordered: Current medicines are reviewed at length with the patient today.  Concerns regarding medicines are outlined above.  No orders of the defined types were placed in this encounter.  Medication changes: No orders of the defined types were placed in this encounter.   Signed, Georgeanna Lea, MD, [facility] 12/18/2022 9:10 AM    [facility]

## 2022-12-19 LAB — LIPID PANEL
Chol/HDL Ratio: 4.2 ratio (ref 0.0–5.0)
Cholesterol, Total: 189 mg/dL (ref 100–199)
HDL: 45 mg/dL (ref >39)
LDL Chol Calc (NIH): 118 mg/dL — ABNORMAL HIGH (ref 0–99)
Triglycerides: 145 mg/dL (ref 0–149)
VLDL Cholesterol Cal: 26 mg/dL (ref 5–40)

## 2022-12-19 LAB — AST: AST: 18 IU/L (ref 0–40)

## 2022-12-19 LAB — ALT: ALT: 19 IU/L (ref 0–44)

## 2022-12-26 ENCOUNTER — Other Ambulatory Visit: Payer: Self-pay

## 2022-12-26 ENCOUNTER — Telehealth: Payer: Self-pay

## 2022-12-26 DIAGNOSIS — E785 Hyperlipidemia, unspecified: Secondary | ICD-10-CM

## 2022-12-26 MED ORDER — NEXLIZET 180-10 MG PO TABS: 1.0000 | ORAL_TABLET | Freq: Every day | ORAL | 6 refills | Status: DC

## 2022-12-26 NOTE — Telephone Encounter (Signed)
Spoke with pt regarding Lab results per Dr. Wendy Poet note. Pt will stop Zetia and start Nexlizet q d. Pt verbalized understanding and had no further questions. Labs in 6 weeks.

## 2022-12-31 ENCOUNTER — Telehealth: Payer: Self-pay

## 2022-12-31 NOTE — Telephone Encounter (Signed)
Prior Auth for Nexlizet completed and approved  Confirmation# [id number] Your request has been approved Effective from 12/31/2022 through 12/30/2023.

## 2023-01-07 ENCOUNTER — Encounter: Payer: Self-pay | Admitting: Family Medicine

## 2023-01-07 ENCOUNTER — Ambulatory Visit: Payer: BC Managed Care – PPO | Admitting: Family Medicine

## 2023-01-07 VITALS — BP 138/80 | HR 62 | Temp 97.9°F | Ht 65.0 in | Wt 237.1 lb

## 2023-01-07 DIAGNOSIS — F411 Generalized anxiety disorder: Secondary | ICD-10-CM

## 2023-01-07 DIAGNOSIS — G8929 Other chronic pain: Secondary | ICD-10-CM

## 2023-01-07 DIAGNOSIS — M25561 Pain in right knee: Secondary | ICD-10-CM

## 2023-01-07 MED ORDER — METHYLPREDNISOLONE ACETATE 40 MG/ML IJ SUSP
40.0000 mg | Freq: Once | INTRAMUSCULAR | Status: AC
  Administered 2023-01-07: 40 mg via INTRA_ARTICULAR

## 2023-01-07 MED ORDER — CITALOPRAM HYDROBROMIDE 10 MG PO TABS: 10.0000 mg | ORAL_TABLET | Freq: Every day | ORAL | 3 refills | Status: DC

## 2023-01-07 MED ORDER — METHYLPREDNISOLONE ACETATE 40 MG/ML IJ SUSP: 40.0000 mg | Freq: Once | INTRAMUSCULAR | Status: DC

## 2023-01-07 NOTE — Progress Notes (Signed)
Chief Complaint  Patient presents with   Depression    Insomnia     Subjective Corey [name] presents for f/u anxiety.  Pt is currently being treated with Xanax 0.5-1 mg TID prn.  Reports doing well since treatment. No thoughts of harming self or others. No self-medication with alcohol, prescription drugs or illicit drugs. Active at work during the day.  Pt is not following with a counselor/psychologist.  Patient has a several year history of bilateral knee pain, worse on the right.  He does have some swelling in the right knee today.  No specific injury or change in activity.  He is very active at work.  He received a steroid injection 4 years ago which did help mildly.  He has never been to physical therapy.  His previous PCP has referred him to a specialist.  Past Medical History:  Diagnosis Date   Angina pectoris ([place]) 06/04/2019   Arthritis    Benign essential HTN 02/10/2016   Bronchospasm with bronchitis, acute 12/14/2013   Coronary artery disease status post PTCA and drug-eluting stent to circumflex artery in July 2020 06/26/2019   Dyslipidemia 09/24/2020   GAD (generalized anxiety disorder) 12/14/2013   GERD (gastroesophageal reflux disease)    occ   History of kidney stones    Hypertension 12/14/2013   Hypogonadism male 12/14/2013   Kidney stones 02/10/2016   Myelopathy ([place]) 06/03/2014   Obesity    OSA (obstructive sleep apnea) 02/10/2016   Shortness of breath    occ-allergies   Sleep apnea    cpap 16 yrs   Allergies as of 01/07/2023       Reactions   Atorvastatin Other (See Comments)   Myalgias   Rosuvastatin Other (See Comments)   Myalgias        Medication List        Accurate as of January 07, 2023  4:48 PM. If you have any questions, ask your nurse or doctor.          acetaminophen 500 MG tablet Commonly known as: TYLENOL Take 1,000 mg by mouth daily.   albuterol 108 (90 Base) MCG/ACT inhaler Commonly known as: VENTOLIN HFA Inhale 2 puffs  into the lungs every 6 (six) hours as needed for wheezing or shortness of breath.   ALPRAZolam 1 MG tablet Commonly known as: XANAX TAKE 1/2 TO 1 (ONE-HALF TO ONE) TABLET BY MOUTH THREE TIMES DAILY AS NEEDED FOR ANXIETY OR SLEEP What changed: See the new instructions.   aspirin EC 81 MG tablet Take 1 tablet (81 mg total) by mouth daily.   celecoxib 200 MG capsule Commonly known as: CELEBREX Take 200 mg by mouth daily.   citalopram 10 MG tablet Commonly known as: CELEXA Take 1 tablet (10 mg total) by mouth daily. Started by: Shelda Pal, DO   fluticasone 50 MCG/ACT nasal spray Commonly known as: FLONASE Place 2 sprays into both nostrils daily.   furosemide 20 MG tablet Commonly known as: LASIX Take 1 tablet (20 mg total) by mouth daily.   losartan-hydrochlorothiazide 100-25 MG tablet Commonly known as: HYZAAR Take 1 tablet by mouth once daily   Nexlizet 180-10 MG Tabs Generic drug: Bempedoic Acid-Ezetimibe Take 1 tablet by mouth daily.   nitroGLYCERIN 0.4 MG SL tablet Commonly known as: NITROSTAT Place 1 tablet (0.4 mg total) under the tongue every 5 (five) minutes as needed for chest pain.   OMEGA-3 FISH OIL PO Take 1 tablet by mouth daily.   ranolazine 1000 MG SR  tablet Commonly known as: RANEXA Take 1 tablet by mouth twice daily What changed: how much to take   sildenafil 50 MG tablet Commonly known as: Viagra Take 1 tablet (50 mg total) by mouth daily as needed for erectile dysfunction.        Exam BP 138/80 (BP Location: Right Arm, Patient Position: Sitting, Cuff Size: Normal)   Pulse 62   Temp 97.9 F (36.6 C) (Oral)   Ht 5' 5"$  (1.651 m)   Wt 237 lb 2 oz (107.6 kg)   SpO2 93%   BMI 39.46 kg/m  General:  well developed, well nourished, in no apparent distress Right knee: Small effusion noted, no TTP, no deformity, no erythema, ecchymosis; negative Stines, Lachman's, varus/valgus stress, patellar apprehension/grind Lungs:  No  respiratory distress Psych: well oriented with normal range of affect and age-appropriate judgement/insight, alert and oriented x4.  Procedure Note; Knee injection Verbal consent obtained. The area of the antero-lateral joint line was palpated and cleaned with alcohol x1. A 27-gauge needle was used to enter the joint space anterolaterally with ease. 40 mg of Depomedrol with 2 mL of 1% lidocaine was injected. The patient tolerated the procedure well. There were no complications noted.  Assessment and Plan  GAD (generalized anxiety disorder) - Plan: citalopram (CELEXA) 10 MG tablet  Chronic pain of right knee - Plan: methylPREDNISolone acetate (DEPO-MEDROL) injection 40 mg, PR ARTHROCENTESIS ASPIR&/INJ MAJOR JT/BURSA W/O [place]  Chronic, unstable.  Start Celexa 10 mg daily.  Continue Xanax for now.  F/u in 1 month. Chronic, unstable.  Has an appointment with a specialist pending from his previous PCP.  Stretches and exercises provided in AVS.  Steroid injection today. Heat, ice, Tylenol.  The patient voiced understanding and agreement to the plan.  [place], DO 01/07/23 4:48 PM

## 2023-01-07 NOTE — Patient Instructions (Addendum)
Aim to do some physical exertion for 150 minutes per week. This is typically divided into 5 days per week, 30 minutes per day. The activity should be enough to get your heart rate up. Anything is better than nothing if you have time constraints.  Please consider counseling. Contact [phone] to schedule an appointment or inquire about cost/insurance coverage.  [facility] located at [place], [place], [place].  Phone number = [phone].  Dr. Lennice Sites - Adult Psychiatry.    [facility] located at [place], [place], [place]. Phone number = [phone].   [facility] located at [address] [place], [place].  Phone number = [phone].   [facility] located at [place], [place], [place].  Phone number = [phone].  Heat (pad or rice pillow in microwave) over affected area, 10-15 minutes twice daily.   Ice/cold pack over area for 10-15 min twice daily.  OK to take Tylenol 1000 mg (2 extra strength tabs) or 975 mg (3 regular strength tabs) every 6 hours as needed.  Foods that may reduce pain: 1) Ginger 2) Blueberries 3) Salmon 4) Pumpkin seeds 5) dark chocolate 6) turmeric 7) tart cherries 8) virgin olive oil 9) chilli peppers 10) mint 11) krill oil  Let us know if you need anything.  Knee Exercises It is normal to feel mild stretching, pulling, tightness, or discomfort as you do these exercises, but you should stop right away if you feel sudden pain or your pain gets worse.  STRETCHING AND RANGE OF MOTION EXERCISES  These exercises warm up your muscles and joints and improve the movement and flexibility of your knee. These exercises also help to relieve pain, numbness, and tingling. Exercise A: Knee Extension, Prone  Lie on your abdomen on a bed. Place your left / right knee just beyond the edge of the surface so your knee is not on the bed. You can put a towel under  your left / right thigh just above your knee for comfort. Relax your leg muscles and allow gravity to straighten your knee. You should feel a stretch behind your left / right knee. Hold this position for 30 seconds. Scoot up so your knee is supported between repetitions. Repeat 2 times. Complete this stretch 3 times per week. Exercise B: Knee Flexion, Active     Lie on your back with both knees straight. If this causes back discomfort, bend your left / right knee so your foot is flat on the floor. Slowly slide your left / right heel back toward your buttocks until you feel a gentle stretch in the front of your knee or thigh. Hold this position for 30 seconds. Slowly slide your left / right heel back to the starting position. Repeat 2 times. Complete this exercise 3 times per week. Exercise C: Quadriceps, Prone     Lie on your abdomen on a firm surface, such as a bed or padded floor. Bend your left / right knee and hold your ankle. If you cannot reach your ankle or pant leg, loop a belt around your foot and grab the belt instead. Gently pull your heel toward your buttocks. Your knee should not slide out to the side. You should feel a stretch in the front of your thigh and knee. Hold this position for 30 seconds. Repeat 2 times. Complete this stretch 3 times per week. Exercise D: Hamstring, Supine  Lie on your back. Loop a belt or  towel over the ball of your left / right foot. The ball of your foot is on the walking surface, right under your toes. Straighten your left / right knee and slowly pull on the belt to raise your leg until you feel a gentle stretch behind your knee. Do not let your left / right knee bend while you do this. Keep your other leg flat on the floor. Hold this position for 30 seconds. Repeat 2 times. Complete this stretch 3 times per week. STRENGTHENING EXERCISES  These exercises build strength and endurance in your knee. Endurance is the ability to use your muscles  for a long time, even after they get tired. Exercise E: Quadriceps, Isometric     Lie on your back with your left / right leg extended and your other knee bent. Put a rolled towel or small pillow under your knee if told by your health care provider. Slowly tense the muscles in the front of your left / right thigh. You should see your kneecap slide up toward your hip or see increased dimpling just above the knee. This motion will push the back of the knee toward the floor. For 3 seconds, keep the muscle as tight as you can without increasing your pain. Relax the muscles slowly and completely. Repeat for 10 total reps Repeat 2 ti mes. Complete this exercise 3 times per week. Exercise F: Straight Leg Raises - Quadriceps  Lie on your back with your left / right leg extended and your other knee bent. Tense the muscles in the front of your left / right thigh. You should see your kneecap slide up or see increased dimpling just above the knee. Your thigh may even shake a bit. Keep these muscles tight as you raise your leg 4-6 inches (10-15 cm) off the floor. Do not let your knee bend. Hold this position for 3 seconds. Keep these muscles tense as you lower your leg. Relax your muscles slowly and completely after each repetition. 10 total reps. Repeat 2 times. Complete this exercise 3 times per week.  Exercise G: Hamstring Curls     If told by your health care provider, do this exercise while wearing ankle weights. Begin with 5 lb weights (optional). Then increase the weight by 1 lb (0.5 kg) increments. Do not wear ankle weights that are more than 20 lbs to start with. Lie on your abdomen with your legs straight. Bend your left / right knee as far as you can without feeling pain. Keep your hips flat against the floor. Hold this position for 3 seconds. Slowly lower your leg to the starting position. Repeat for 10 reps.  Repeat 2 times. Complete this exercise 3 times per week. Exercise H: Squats  (Quadriceps)  Stand in front of a table, with your feet and knees pointing straight ahead. You may rest your hands on the table for balance but not for support. Slowly bend your knees and lower your hips like you are going to sit in a chair. Keep your weight over your heels, not over your toes. Keep your lower legs upright so they are parallel with the table legs. Do not let your hips go lower than your knees. Do not bend lower than told by your health care provider. If your knee pain increases, do not bend as low. Hold the squat position for 1 second. Slowly push with your legs to return to standing. Do not use your hands to pull yourself to standing. Repeat 2 times. Complete  this exercise 3 times per week. Exercise I: Wall Slides (Quadriceps)     Lean your back against a smooth wall or door while you walk your feet out 18-24 inches (46-61 cm) from it. Place your feet hip-width apart. Slowly slide down the wall or door until your knees Repeat 2 times. Complete this exercise every other day. Exercise K: Straight Leg Raises - Hip Abductors  Lie on your side with your left / right leg in the top position. Lie so your head, shoulder, knee, and hip line up. You may bend your bottom knee to help you keep your balance. Roll your hips slightly forward so your hips are stacked directly over each other and your left / right knee is facing forward. Leading with your heel, lift your top leg 4-6 inches (10-15 cm). You should feel the muscles in your outer hip lifting. Do not let your foot drift forward. Do not let your knee roll toward the ceiling. Hold this position for 3 seconds. Slowly return your leg to the starting position. Let your muscles relax completely after each repetition. 10 total reps. Repeat 2 times. Complete this exercise 3 times per week. Exercise J: Straight Leg Raises - Hip Extensors  Lie on your abdomen on a firm surface. You can put a pillow under your hips if that is more  comfortable. Tense the muscles in your buttocks and lift your left / right leg about 4-6 inches (10-15 cm). Keep your knee straight as you lift your leg. Hold this position for 3 seconds. Slowly lower your leg to the starting position. Let your leg relax completely after each repetition. Repeat 2 times. Complete this exercise 3 times per week. Document Released: 09/26/2005 Document Revised: 08/06/2016 Document Reviewed: 09/18/2015 Elsevier Interactive Patient Education  2017 [employer].

## 2023-01-25 ENCOUNTER — Telehealth: Payer: Self-pay | Admitting: Family Medicine

## 2023-01-25 ENCOUNTER — Other Ambulatory Visit: Payer: Self-pay | Admitting: Cardiology

## 2023-01-25 DIAGNOSIS — I2510 Atherosclerotic heart disease of native coronary artery without angina pectoris: Secondary | ICD-10-CM

## 2023-01-25 NOTE — Telephone Encounter (Signed)
Could you please call Corey [name] patients daughter to set up a NP appt. Her number is [phone] and thanks so much

## 2023-01-25 NOTE — Telephone Encounter (Signed)
Ok thanks 

## 2023-01-25 NOTE — Telephone Encounter (Signed)
The patients daughter Corey [name] would like to be seen as a NP . She is going through a divorce/having a lot of anxiety. Would you be willing to take her on.

## 2023-02-05 ENCOUNTER — Encounter: Payer: Self-pay | Admitting: Family Medicine

## 2023-02-05 ENCOUNTER — Ambulatory Visit: Payer: BC Managed Care – PPO | Admitting: Family Medicine

## 2023-02-05 VITALS — BP 122/60 | HR 65 | Temp 97.7°F | Resp 18 | Ht 65.0 in | Wt 237.2 lb

## 2023-02-05 DIAGNOSIS — F411 Generalized anxiety disorder: Secondary | ICD-10-CM

## 2023-02-05 DIAGNOSIS — Z87891 Personal history of nicotine dependence: Secondary | ICD-10-CM | POA: Diagnosis not present

## 2023-02-05 DIAGNOSIS — Z122 Encounter for screening for malignant neoplasm of respiratory organs: Secondary | ICD-10-CM | POA: Diagnosis not present

## 2023-02-05 MED ORDER — BUSPIRONE HCL 5 MG PO TABS: 5.0000 mg | ORAL_TABLET | Freq: Two times a day (BID) | ORAL | 1 refills | Status: DC

## 2023-02-05 NOTE — Patient Instructions (Signed)
Keep the diet clean and stay active.  Aim to do some physical exertion for 150 minutes per week. This is typically divided into 5 days per week, 30 minutes per day. The activity should be enough to get your heart rate up. Anything is better than nothing if you have time constraints.  Take Metamucil or Benefiber daily before meals.   Let us know if you need anything.

## 2023-02-05 NOTE — Progress Notes (Signed)
Chief Complaint  Patient presents with   1 month follow up    Pt says he only want to take the Xanax, he did not like the way the Celexa made him feel.     Subjective Corey [name] presents for f/u anxiety.  Pt is currently being treated with Xanax 0.5 mg qhs prn. He was started on Celexa 10 mg/d and did not like the way it made him feel.  No thoughts of harming self or others. No self-medication with alcohol, prescription drugs, some self medication w marijuana. Pt is not following with a counselor/psychologist.  Past Medical History:  Diagnosis Date   Angina pectoris ([place]) 06/04/2019   Arthritis    Benign essential HTN 02/10/2016   Bronchospasm with bronchitis, acute 12/14/2013   Coronary artery disease status post PTCA and drug-eluting stent to circumflex artery in July 2020 06/26/2019   Dyslipidemia 09/24/2020   GAD (generalized anxiety disorder) 12/14/2013   GERD (gastroesophageal reflux disease)    occ   History of kidney stones    Hypertension 12/14/2013   Hypogonadism male 12/14/2013   Kidney stones 02/10/2016   Myelopathy ([place]) 06/03/2014   Obesity    OSA (obstructive sleep apnea) 02/10/2016   Shortness of breath    occ-allergies   Sleep apnea    cpap 16 yrs   Allergies as of 02/05/2023       Reactions   Atorvastatin Other (See Comments)   Myalgias   Rosuvastatin Other (See Comments)   Myalgias        Medication List        Accurate as of February 05, 2023  4:32 PM. If you have any questions, ask your nurse or doctor.          STOP taking these medications    citalopram 10 MG tablet Commonly known as: CELEXA Stopped by: Shelda Pal, DO       TAKE these medications    acetaminophen 500 MG tablet Commonly known as: TYLENOL Take 1,000 mg by mouth daily.   albuterol 108 (90 Base) MCG/ACT inhaler Commonly known as: VENTOLIN HFA Inhale 2 puffs into the lungs every 6 (six) hours as needed for wheezing or shortness of breath.   ALPRAZolam 1  MG tablet Commonly known as: XANAX TAKE 1/2 TO 1 (ONE-HALF TO ONE) TABLET BY MOUTH THREE TIMES DAILY AS NEEDED FOR ANXIETY OR SLEEP What changed: See the new instructions.   aspirin EC 81 MG tablet Take 1 tablet (81 mg total) by mouth daily.   busPIRone 5 MG tablet Commonly known as: BUSPAR Take 1 tablet (5 mg total) by mouth 2 (two) times daily. Started by: Shelda Pal, DO   celecoxib 200 MG capsule Commonly known as: CELEBREX Take 200 mg by mouth daily.   fluticasone 50 MCG/ACT nasal spray Commonly known as: FLONASE Place 2 sprays into both nostrils daily.   furosemide 20 MG tablet Commonly known as: LASIX Take 1 tablet by mouth once daily   losartan-hydrochlorothiazide 100-25 MG tablet Commonly known as: HYZAAR Take 1 tablet by mouth once daily   Nexlizet 180-10 MG Tabs Generic drug: Bempedoic Acid-Ezetimibe Take 1 tablet by mouth daily.   nitroGLYCERIN 0.4 MG SL tablet Commonly known as: NITROSTAT Place 1 tablet (0.4 mg total) under the tongue every 5 (five) minutes as needed for chest pain.   OMEGA-3 FISH OIL PO Take 1 tablet by mouth daily.   ranolazine 1000 MG SR tablet Commonly known as: RANEXA Take 1 tablet by  mouth twice daily   sildenafil 50 MG tablet Commonly known as: Viagra Take 1 tablet (50 mg total) by mouth daily as needed for erectile dysfunction.        Exam BP 122/60 (BP Location: Left Arm, Patient Position: Sitting, Cuff Size: Large)   Pulse 65   Temp 97.7 F (36.5 C) (Temporal)   Resp 18   Ht '5\' 5"'$  (1.651 m)   Wt 237 lb 3.2 oz (107.6 kg)   SpO2 94%   BMI 39.47 kg/m  General:  well developed, well nourished, in no apparent distress Lungs:  No respiratory distress Psych: well oriented with normal range of affect and age-appropriate judgement/insight, alert and oriented x4.  Assessment and Plan  GAD (generalized anxiety disorder) - Plan: busPIRone (BUSPAR) 5 MG tablet  Encounter for screening for malignant neoplasm  of lung in former smoker who quit in past 15 years with 30 pack year history or greater - Plan: Ambulatory Referral Lung Cancer Screening [facility]  Chronic, unstable. Stop Celexa. Start BuSpar 5 mg bid.  Refer lung cancer screening team.  F/u in 1 mo. The patient voiced understanding and agreement to the plan.  [place], DO 02/05/23 4:32 PM

## 2023-04-09 ENCOUNTER — Ambulatory Visit: Payer: BC Managed Care – PPO | Admitting: Family Medicine

## 2023-04-09 ENCOUNTER — Encounter: Payer: Self-pay | Admitting: Family Medicine

## 2023-04-09 VITALS — BP 130/80 | HR 74 | Temp 98.9°F | Ht 64.0 in | Wt 235.4 lb

## 2023-04-09 DIAGNOSIS — G8929 Other chronic pain: Secondary | ICD-10-CM | POA: Diagnosis not present

## 2023-04-09 DIAGNOSIS — M25562 Pain in left knee: Secondary | ICD-10-CM

## 2023-04-09 MED ORDER — CELECOXIB 200 MG PO CAPS: 200.0000 mg | ORAL_CAPSULE | Freq: Every day | ORAL | 2 refills | Status: DC

## 2023-04-09 MED ORDER — METHYLPREDNISOLONE ACETATE 40 MG/ML IJ SUSP
40.0000 mg | Freq: Once | INTRAMUSCULAR | Status: AC
  Administered 2023-04-09: 40 mg via INTRA_ARTICULAR

## 2023-04-09 NOTE — Progress Notes (Signed)
Musculoskeletal Exam  Patient: Corey [name] DOB: 03/05/1961  DOS: 04/09/2023  SUBJECTIVE:  Chief Complaint:   Chief Complaint  Patient presents with   Knee Pain    Left knee     Corey [name] is a 62 y.o.  male for evaluation and treatment of L knee pain.   Onset:  1 day ago flared up. This has been a recurrent issue for years. No inj or change in activity.  Location: L knee anteriorly Character:   tearing   Progression of issue:  is unchanged Associated symptoms: swelling No bruising or redness, fevers Treatment: to date has been prescription NSAIDS and acetaminophen.   Neurovascular symptoms: no  Past Medical History:  Diagnosis Date   Angina pectoris ([facility]) 06/04/2019   Arthritis    Benign essential HTN 02/10/2016   Bronchospasm with bronchitis, acute 12/14/2013   Coronary artery disease status post PTCA and drug-eluting stent to circumflex artery in July 2020 06/26/2019   Dyslipidemia 09/24/2020   GAD (generalized anxiety disorder) 12/14/2013   GERD (gastroesophageal reflux disease)    occ   History of kidney stones    Hypertension 12/14/2013   Hypogonadism male 12/14/2013   Kidney stones 02/10/2016   Myelopathy ([facility]) 06/03/2014   Obesity    OSA (obstructive sleep apnea) 02/10/2016   Shortness of breath    occ-allergies   Sleep apnea    cpap 16 yrs    Objective: VITAL SIGNS: BP 130/80 (BP Location: Left Arm, Patient Position: Sitting, Cuff Size: Normal)   Pulse 74   Temp 98.9 F (37.2 C) (Oral)   Ht 5\' 4"  (1.626 m)   Wt 235 lb 6 oz (106.8 kg)   SpO2 98%   BMI 40.40 kg/m  Constitutional: Well formed, well developed. No acute distress. Thorax & Lungs: No accessory muscle use Musculoskeletal: L knee.   Normal active range of motion: yes.   Normal passive range of motion: yes Tenderness to palpation: yes over the distal quad prox to the patella Deformity: no Ecchymosis: no Tests positive: patellar grind Tests negative: Lachman's Neurologic: Normal  sensory function.  Psychiatric: Normal mood. Age appropriate judgment and insight. Alert & oriented x 3.    Procedure Note; Knee injection Verbal consent obtained. The area of the antero-lateral joint line was palpated and cleaned with alcohol x1. A 25-gauge needle was used to enter the joint space anterolmedially with ease. Tried to aspirate jt fluid without success.  40 mg of Depomedrol with 2 mL of 1% lidocaine was injected. A bandaid was placed.  The patient tolerated the procedure well. There were no complications noted.  Assessment:  Chronic pain of left knee - Plan: PR ARTHROCENTESIS ASPIR&/INJ MAJOR JT/BURSA W/O [place], methylPREDNISolone acetate (DEPO-MEDROL) injection 40 mg  Plan: Exacerbation of chronic issue. Refill Celebrex. Stretches/exercises, heat, ice, Tylenol.  F/u as originally scheduled.  The patient voiced understanding and agreement to the plan.   Jilda Roche [place], DO 04/09/23  4:34 PM

## 2023-04-09 NOTE — Patient Instructions (Addendum)
Ice/cold pack over area for 10-15 min twice daily.  OK to take Tylenol 1000 mg (2 extra strength tabs) or 975 mg (3 regular strength tabs) every 6 hours as needed.  Let us know if you need anything.  Knee Exercises It is normal to feel mild stretching, pulling, tightness, or discomfort as you do these exercises, but you should stop right away if you feel sudden pain or your pain gets worse.  STRETCHING AND RANGE OF MOTION EXERCISES  These exercises warm up your muscles and joints and improve the movement and flexibility of your knee. These exercises also help to relieve pain, numbness, and tingling. Exercise A: Knee Extension, Prone  Lie on your abdomen on a bed. Place your left / right knee just beyond the edge of the surface so your knee is not on the bed. You can put a towel under your left / right thigh just above your knee for comfort. Relax your leg muscles and allow gravity to straighten your knee. You should feel a stretch behind your left / right knee. Hold this position for 30 seconds. Scoot up so your knee is supported between repetitions. Repeat 2 times. Complete this stretch 3 times per week. Exercise B: Knee Flexion, Active     Lie on your back with both knees straight. If this causes back discomfort, bend your left / right knee so your foot is flat on the floor. Slowly slide your left / right heel back toward your buttocks until you feel a gentle stretch in the front of your knee or thigh. Hold this position for 30 seconds. Slowly slide your left / right heel back to the starting position. Repeat 2 times. Complete this exercise 3 times per week. Exercise C: Quadriceps, Prone     Lie on your abdomen on a firm surface, such as a bed or padded floor. Bend your left / right knee and hold your ankle. If you cannot reach your ankle or pant leg, loop a belt around your foot and grab the belt instead. Gently pull your heel toward your buttocks. Your knee should not slide out to  the side. You should feel a stretch in the front of your thigh and knee. Hold this position for 30 seconds. Repeat 2 times. Complete this stretch 3 times per week. Exercise D: Hamstring, Supine  Lie on your back. Loop a belt or towel over the ball of your left / right foot. The ball of your foot is on the walking surface, right under your toes. Straighten your left / right knee and slowly pull on the belt to raise your leg until you feel a gentle stretch behind your knee. Do not let your left / right knee bend while you do this. Keep your other leg flat on the floor. Hold this position for 30 seconds. Repeat 2 times. Complete this stretch 3 times per week. STRENGTHENING EXERCISES  These exercises build strength and endurance in your knee. Endurance is the ability to use your muscles for a long time, even after they get tired. Exercise E: Quadriceps, Isometric     Lie on your back with your left / right leg extended and your other knee bent. Put a rolled towel or small pillow under your knee if told by your health care provider. Slowly tense the muscles in the front of your left / right thigh. You should see your kneecap slide up toward your hip or see increased dimpling just above the knee. This motion will push   the back of the knee toward the floor. For 3 seconds, keep the muscle as tight as you can without increasing your pain. Relax the muscles slowly and completely. Repeat for 10 total reps Repeat 2 ti mes. Complete this exercise 3 times per week. Exercise F: Straight Leg Raises - Quadriceps  Lie on your back with your left / right leg extended and your other knee bent. Tense the muscles in the front of your left / right thigh. You should see your kneecap slide up or see increased dimpling just above the knee. Your thigh may even shake a bit. Keep these muscles tight as you raise your leg 4-6 inches (10-15 cm) off the floor. Do not let your knee bend. Hold this position for 3  seconds. Keep these muscles tense as you lower your leg. Relax your muscles slowly and completely after each repetition. 10 total reps. Repeat 2 times. Complete this exercise 3 times per week.  Exercise G: Hamstring Curls     If told by your health care provider, do this exercise while wearing ankle weights. Begin with 5 lb weights (optional). Then increase the weight by 1 lb (0.5 kg) increments. Do not wear ankle weights that are more than 20 lbs to start with. Lie on your abdomen with your legs straight. Bend your left / right knee as far as you can without feeling pain. Keep your hips flat against the floor. Hold this position for 3 seconds. Slowly lower your leg to the starting position. Repeat for 10 reps.  Repeat 2 times. Complete this exercise 3 times per week. Exercise H: Squats (Quadriceps)  Stand in front of a table, with your feet and knees pointing straight ahead. You may rest your hands on the table for balance but not for support. Slowly bend your knees and lower your hips like you are going to sit in a chair. Keep your weight over your heels, not over your toes. Keep your lower legs upright so they are parallel with the table legs. Do not let your hips go lower than your knees. Do not bend lower than told by your health care provider. If your knee pain increases, do not bend as low. Hold the squat position for 1 second. Slowly push with your legs to return to standing. Do not use your hands to pull yourself to standing. Repeat 2 times. Complete this exercise 3 times per week. Exercise I: Wall Slides (Quadriceps)     Lean your back against a smooth wall or door while you walk your feet out 18-24 inches (46-61 cm) from it. Place your feet hip-width apart. Slowly slide down the wall or door until your knees Repeat 2 times. Complete this exercise every other day. Exercise K: Straight Leg Raises - Hip Abductors  Lie on your side with your left / right leg in the top  position. Lie so your head, shoulder, knee, and hip line up. You may bend your bottom knee to help you keep your balance. Roll your hips slightly forward so your hips are stacked directly over each other and your left / right knee is facing forward. Leading with your heel, lift your top leg 4-6 inches (10-15 cm). You should feel the muscles in your outer hip lifting. Do not let your foot drift forward. Do not let your knee roll toward the ceiling. Hold this position for 3 seconds. Slowly return your leg to the starting position. Let your muscles relax completely after each repetition. 10 total reps.   Repeat 2 times. Complete this exercise 3 times per week. Exercise J: Straight Leg Raises - Hip Extensors  Lie on your abdomen on a firm surface. You can put a pillow under your hips if that is more comfortable. Tense the muscles in your buttocks and lift your left / right leg about 4-6 inches (10-15 cm). Keep your knee straight as you lift your leg. Hold this position for 3 seconds. Slowly lower your leg to the starting position. Let your leg relax completely after each repetition. Repeat 2 times. Complete this exercise 3 times per week. Document Released: 09/26/2005 Document Revised: 08/06/2016 Document Reviewed: 09/18/2015 Elsevier Interactive Patient Education  2017 [employer]  

## 2023-04-10 ENCOUNTER — Telehealth: Payer: Self-pay | Admitting: Cardiology

## 2023-04-10 MED ORDER — NEXLIZET 180-10 MG PO TABS: 1.0000 | ORAL_TABLET | Freq: Every day | ORAL | 3 refills | Status: DC

## 2023-04-10 NOTE — Telephone Encounter (Signed)
Pt states he got samples of a cholesterol medication but doesn't remember the name of it. He states the medication is working pretty good and would like a refill on it for 90 days sent to [facility]. Please advise.

## 2023-04-28 ENCOUNTER — Other Ambulatory Visit: Payer: Self-pay | Admitting: Cardiology

## 2023-04-28 DIAGNOSIS — I2510 Atherosclerotic heart disease of native coronary artery without angina pectoris: Secondary | ICD-10-CM

## 2023-05-23 ENCOUNTER — Telehealth: Payer: Self-pay | Admitting: Family Medicine

## 2023-05-23 NOTE — Telephone Encounter (Signed)
Spoke to patient regarding a referral to our Pulmonary office and he stated he had already gotten a lung screening done towards the end of last year. He would like to try and request the records from them so he won't have to do them again. He was unsure of where he got it done, but mentioned it might have been [facility]. Referral has been closed for now.

## 2023-05-24 NOTE — Progress Notes (Deleted)
Office Visit Note  Patient: Corey [name]             Date of Birth: 1961-07-21           MRN: [id number]             PCP: [name], DO Referring: Erskine Emery, NP Visit Date: 06/07/2023 Occupation: @GUAROCC @  Subjective:  No chief complaint on file.   History of Present Illness: Corey [name] is a 62 y.o. male ***     Activities of Daily Living:  Patient reports morning stiffness for *** {minute/hour:19697}.   Patient {ACTIONS;DENIES/REPORTS:21021675::"Denies"} nocturnal pain.  Difficulty dressing/grooming: {ACTIONS;DENIES/REPORTS:21021675::"Denies"} Difficulty climbing stairs: {ACTIONS;DENIES/REPORTS:21021675::"Denies"} Difficulty getting out of chair: {ACTIONS;DENIES/REPORTS:21021675::"Denies"} Difficulty using hands for taps, buttons, cutlery, and/or writing: {ACTIONS;DENIES/REPORTS:21021675::"Denies"}  No Rheumatology ROS completed.   PMFS History:  Patient Active Problem List   Diagnosis Date Noted   Dyslipidemia 09/24/2020   Sleep apnea    Shortness of breath    Obesity    History of kidney stones    Arthritis    Coronary artery disease status post PTCA and drug-eluting stent to circumflex artery in July 2020 06/26/2019   Angina pectoris (HCC) 06/04/2019   Benign essential HTN 02/10/2016   OSA (obstructive sleep apnea) 02/10/2016   GERD (gastroesophageal reflux disease) 02/10/2016   Kidney stones 02/10/2016   Myelopathy (HCC) 06/03/2014   Bronchospasm with bronchitis, acute 12/14/2013   GAD (generalized anxiety disorder) 12/14/2013   Hypertension 12/14/2013   Hypogonadism male 12/14/2013    Past Medical History:  Diagnosis Date   Angina pectoris (HCC) 06/04/2019   Arthritis    Benign essential HTN 02/10/2016   Bronchospasm with bronchitis, acute 12/14/2013   Coronary artery disease status post PTCA and drug-eluting stent to circumflex artery in July 2020 06/26/2019   Dyslipidemia 09/24/2020   GAD (generalized anxiety disorder)  12/14/2013   GERD (gastroesophageal reflux disease)    occ   History of kidney stones    Hypertension 12/14/2013   Hypogonadism male 12/14/2013   Kidney stones 02/10/2016   Myelopathy (HCC) 06/03/2014   Obesity    OSA (obstructive sleep apnea) 02/10/2016   Shortness of breath    occ-allergies   Sleep apnea    cpap 16 yrs    Family History  Problem Relation Age of Onset   Arrhythmia Mother        afib   Heart failure Mother    CAD Father 76       CABG with redo   Past Surgical History:  Procedure Laterality Date   ANTERIOR CERVICAL DECOMP/DISCECTOMY FUSION N/A 06/03/2014   Procedure: ANTERIOR CERVICAL DECOMPRESSION FUSION, CERVICAL FIVE-SIX, CERVICAL SIX-SEVEN WITH INSTRUMENTATION AND ALLOGRAFT;  Surgeon: Emilee Hero, MD;  Location: [facility] OR;  Service: Orthopedics;  Laterality: N/A;   CORONARY STENT INTERVENTION N/A 06/19/2019   Procedure: CORONARY STENT INTERVENTION;  Surgeon: Swaziland, Peter M, MD;  Location: [facility] INVASIVE CV LAB;  Service: Cardiovascular;  Laterality: N/A;   I & D EXTREMITY Left 11/10/2018   Procedure: IRRIGATION AND DEBRIDEMENT EXTREMITY, REVISION AMPUTATION OF LEFT RING FINGER;  Surgeon: Mack Hook, MD;  Location: [facility] OR;  Service: Orthopedics;  Laterality: Left;   LEFT HEART CATH AND CORONARY ANGIOGRAPHY N/A 06/19/2019   Procedure: LEFT HEART CATH AND CORONARY ANGIOGRAPHY;  Surgeon: Swaziland, Peter M, MD;  Location: [facility] INVASIVE CV LAB;  Service: Cardiovascular;  Laterality: N/A;   URETHRAL DILATION     child   Social History   Social History Narrative  Not on file   Immunization History  Administered Date(s) Administered   Tdap 11/10/2018     Objective: Vital Signs: There were no vitals taken for this visit.   Physical Exam   Musculoskeletal Exam: ***  CDAI Exam: CDAI Score: -- Patient Global: --; Provider Global: -- Swollen: --; Tender: -- Joint Exam 06/07/2023   No joint exam has been documented for this visit   There is currently no  information documented on the homunculus. Go to the Rheumatology activity and complete the homunculus joint exam.  Investigation: No additional findings.  Imaging: No results found.  Recent Labs: Lab Results  Component Value Date   WBC 8.7 01/18/2021   HGB 14.7 01/18/2021   PLT 301 01/18/2021   NA 137 01/18/2021   K 3.7 01/18/2021   CL 102 01/18/2021   CO2 25 01/18/2021   GLUCOSE 106 (H) 01/18/2021   BUN 18 01/18/2021   CREATININE 0.97 01/18/2021   BILITOT 0.2 09/25/2021   ALKPHOS 86 09/25/2021   AST 18 12/18/2022   ALT 19 12/18/2022   PROT 7.1 09/25/2021   ALBUMIN 4.5 09/25/2021   CALCIUM 9.2 01/18/2021   GFRAA 110 06/15/2019   October 30, 2022 uric acid 4.9, CK1 07, C-reactive protein 3.1, RF 45.3, anti-CCP negative, vitamin D 34.5, testosterone normal, ANA negative, ESR 1  Speciality Comments: No specialty comments available.  Procedures:  No procedures performed Allergies: Atorvastatin and Rosuvastatin   Assessment / Plan:     Visit Diagnoses: No diagnosis found.  Orders: No orders of the defined types were placed in this encounter.  No orders of the defined types were placed in this encounter.   Face-to-face time spent with patient was *** minutes. Greater than 50% of time was spent in counseling and coordination of care.  Follow-Up Instructions: No follow-ups on file.   Pollyann Savoy, MD  Note - This record has been created using Animal nutritionist.  Chart creation errors have been sought, but may not always  have been located. Such creation errors do not reflect on  the standard of medical care.

## 2023-05-24 NOTE — Telephone Encounter (Signed)
Called informed the patient to go to location he had this done at and sign a release of records. He knew where to go and will take care of it.

## 2023-06-07 ENCOUNTER — Encounter: Payer: BC Managed Care – PPO | Admitting: Rheumatology

## 2023-06-07 DIAGNOSIS — I2510 Atherosclerotic heart disease of native coronary artery without angina pectoris: Secondary | ICD-10-CM

## 2023-06-07 DIAGNOSIS — N200 Calculus of kidney: Secondary | ICD-10-CM

## 2023-06-07 DIAGNOSIS — E785 Hyperlipidemia, unspecified: Secondary | ICD-10-CM

## 2023-06-07 DIAGNOSIS — Z87442 Personal history of urinary calculi: Secondary | ICD-10-CM

## 2023-06-07 DIAGNOSIS — G4733 Obstructive sleep apnea (adult) (pediatric): Secondary | ICD-10-CM

## 2023-06-07 DIAGNOSIS — G959 Disease of spinal cord, unspecified: Secondary | ICD-10-CM

## 2023-06-07 DIAGNOSIS — F411 Generalized anxiety disorder: Secondary | ICD-10-CM

## 2023-06-07 DIAGNOSIS — K219 Gastro-esophageal reflux disease without esophagitis: Secondary | ICD-10-CM

## 2023-06-07 DIAGNOSIS — Z87891 Personal history of nicotine dependence: Secondary | ICD-10-CM

## 2023-06-07 DIAGNOSIS — E291 Testicular hypofunction: Secondary | ICD-10-CM

## 2023-06-07 DIAGNOSIS — E559 Vitamin D deficiency, unspecified: Secondary | ICD-10-CM

## 2023-06-07 DIAGNOSIS — I10 Essential (primary) hypertension: Secondary | ICD-10-CM

## 2023-06-07 DIAGNOSIS — G5603 Carpal tunnel syndrome, bilateral upper limbs: Secondary | ICD-10-CM

## 2023-06-07 DIAGNOSIS — R768 Other specified abnormal immunological findings in serum: Secondary | ICD-10-CM

## 2023-06-07 DIAGNOSIS — I209 Angina pectoris, unspecified: Secondary | ICD-10-CM

## 2023-06-07 DIAGNOSIS — G629 Polyneuropathy, unspecified: Secondary | ICD-10-CM

## 2023-06-24 ENCOUNTER — Other Ambulatory Visit: Payer: Self-pay | Admitting: Cardiology

## 2023-06-25 NOTE — Telephone Encounter (Signed)
Rx refill sent to pharmacy. 

## 2023-07-05 ENCOUNTER — Ambulatory Visit: Payer: BC Managed Care – PPO | Admitting: Rheumatology

## 2023-08-14 ENCOUNTER — Other Ambulatory Visit: Payer: Self-pay | Admitting: Cardiology

## 2023-08-14 DIAGNOSIS — I2510 Atherosclerotic heart disease of native coronary artery without angina pectoris: Secondary | ICD-10-CM

## 2023-09-11 ENCOUNTER — Encounter: Payer: Self-pay | Admitting: Family Medicine

## 2023-10-04 ENCOUNTER — Encounter: Payer: Self-pay | Admitting: Family Medicine

## 2023-10-04 ENCOUNTER — Telehealth (INDEPENDENT_AMBULATORY_CARE_PROVIDER_SITE_OTHER): Payer: BC Managed Care – PPO | Admitting: Family Medicine

## 2023-10-04 DIAGNOSIS — J40 Bronchitis, not specified as acute or chronic: Secondary | ICD-10-CM

## 2023-10-04 DIAGNOSIS — J0140 Acute pansinusitis, unspecified: Secondary | ICD-10-CM

## 2023-10-04 MED ORDER — PREDNISONE 20 MG PO TABS
40.0000 mg | ORAL_TABLET | Freq: Every day | ORAL | 0 refills | Status: AC
Start: 2023-10-04 — End: 2023-10-09

## 2023-10-04 MED ORDER — DOXYCYCLINE HYCLATE 100 MG PO TABS
100.0000 mg | ORAL_TABLET | Freq: Two times a day (BID) | ORAL | 0 refills | Status: AC
Start: 2023-10-06 — End: 2023-10-13

## 2023-10-04 NOTE — Progress Notes (Signed)
Chief Complaint  Patient presents with   Nasal Congestion    Congestion and cough    Chinita Pester here for URI complaints. We are interacting via web portal for an electronic face-to-face visit. I verified patient's ID using 2 identifiers. Patient agreed to proceed with visit via this method. Patient is in a parked car, I am at office. Patient and I are present for visit.   Duration: 4 days  Associated symptoms: subjective fever, sinus congestion, sinus pain, rhinorrhea, sore throat, wheezing, shortness of breath, myalgia, upper dental pain, and productive cough Denies: itchy watery eyes, ear pain, ear drainage, and N/V/D Treatment to date: OTC meds, SABA (helped a bit), Tylenol Sick contacts: Yes; daughter w walking PNA  Past Medical History:  Diagnosis Date   Angina pectoris (HCC) 06/04/2019   Arthritis    Benign essential HTN 02/10/2016   Bronchospasm with bronchitis, acute 12/14/2013   Coronary artery disease status post PTCA and drug-eluting stent to circumflex artery in July 2020 06/26/2019   Dyslipidemia 09/24/2020   GAD (generalized anxiety disorder) 12/14/2013   GERD (gastroesophageal reflux disease)    occ   History of kidney stones    Hypertension 12/14/2013   Hypogonadism male 12/14/2013   Kidney stones 02/10/2016   Myelopathy (HCC) 06/03/2014   Obesity    OSA (obstructive sleep apnea) 02/10/2016   Shortness of breath    occ-allergies   Sleep apnea    cpap 16 yrs    Objective No conversational dyspnea Age appropriate judgment and insight Nml affect and mood   Acute pansinusitis, recurrence not specified - Plan: doxycycline (VIBRA-TABS) 100 MG tablet, predniSONE (DELTASONE) 20 MG tablet  Wheezy bronchitis - Plan: predniSONE (DELTASONE) 20 MG tablet  5 d pred burst 40 mg/d. If no improvement in 2 d, will take 7 d of doxy. Sooner if he takes his temp and 100.4 F. Continue to push fluids, practice good hand hygiene, cover mouth when coughing. F/u prn. If starting to  experience fevers, shaking, or shortness of breath, seek immediate care. Pt voiced understanding and agreement to the plan.  Jilda Roche Hudson, DO 10/04/23 2:42 PM

## 2023-10-12 ENCOUNTER — Encounter: Payer: Self-pay | Admitting: Family Medicine

## 2023-10-21 ENCOUNTER — Other Ambulatory Visit: Payer: Self-pay | Admitting: Family Medicine

## 2023-10-21 DIAGNOSIS — F411 Generalized anxiety disorder: Secondary | ICD-10-CM

## 2023-10-21 MED ORDER — ALPRAZOLAM 1 MG PO TABS
1.0000 mg | ORAL_TABLET | Freq: Three times a day (TID) | ORAL | 0 refills | Status: DC | PRN
Start: 2023-10-21 — End: 2024-02-10

## 2023-10-21 NOTE — Telephone Encounter (Signed)
Requesting: Xanax 1 mg Contract: N/A UDS: N/A Last Visit: 10/04/2023 VV Next Visit: N/A Last Refill: 10/06/2021  Please Advise

## 2023-10-23 ENCOUNTER — Encounter: Payer: Self-pay | Admitting: Nurse Practitioner

## 2023-10-23 ENCOUNTER — Ambulatory Visit: Payer: BC Managed Care – PPO

## 2023-10-23 ENCOUNTER — Ambulatory Visit: Payer: BC Managed Care – PPO | Admitting: Nurse Practitioner

## 2023-10-23 VITALS — BP 120/70 | HR 53 | Ht 65.0 in | Wt 220.0 lb

## 2023-10-23 DIAGNOSIS — R5382 Chronic fatigue, unspecified: Secondary | ICD-10-CM

## 2023-10-23 DIAGNOSIS — G4733 Obstructive sleep apnea (adult) (pediatric): Secondary | ICD-10-CM

## 2023-10-23 DIAGNOSIS — Z87891 Personal history of nicotine dependence: Secondary | ICD-10-CM

## 2023-10-23 DIAGNOSIS — J310 Chronic rhinitis: Secondary | ICD-10-CM

## 2023-10-23 DIAGNOSIS — R0609 Other forms of dyspnea: Secondary | ICD-10-CM | POA: Diagnosis not present

## 2023-10-23 NOTE — Progress Notes (Addendum)
@Patient  ID: Corey [name], male    DOB: [birth year], 62 y.o.   MRN: [id number]  Chief Complaint  Patient presents with   Consult    Sleep Consult    Referring provider: [name]*  HPI: 62 year old male, former smoker referred for sleep consult. Past medical history significant for HTN, CAD s/p PTCA and stent, OSA on CPAP, GERD, chronic bronchitis, anxiety, obesity.   TEST/EVENTS:  03/2011 NPSG: AHI 95/h, SpO2 low 81%; 240 pounds 03/08/2016 echo: EF 60-65%, mild to moderate LVH 01/18/2021 CXR: lungs clear  09/30/2020: OV with Dr. Vassie Loll.  Presents for evaluation and to establish care for OSA.  Recently had a COVID infection unfortunately his wife of 22 years passed away after an acute illness.  He is still struggling with her demise and reliving events related to her hospital stay.  Has residual dyspnea, was treated for acute bronchitis recently.  Chest x-ray 8/31 showed clear lungs.  May need evaluation for COPD later down the line.  Does not want testing at this time.  OSA was diagnosed in 2012.  Reviewed sleep study which showed severe OSA.  Was started on CPAP.  Has improved his daytime somnolence and fatigue.  Compliance has been sporadic but he states that he certainly feels better on nights when he uses the machine.  Epworth 9.  Loss about 15 pounds in his original study.  No history suggestive of cataplexy, sleep paralysis or parasomnias.  Works as a Public affairs consultant.  Quit smoking 10 years ago.  Machine is part of the recall and is also old.  Would like to place orders for new CPAP machine.  Prefers a nasal mask with chinstrap.  Orders placed for auto CPAP 10-15 cmH2O.  Trial of AirFit N30 mask with chinstrap.  10/23/2023: Today - sleep consult Discussed the use of AI scribe software for clinical note transcription with the patient, who gave verbal consent to proceed.  History of Present Illness   The patient presents today for sleep  consult but he is also having respiratory concerns that he would like to discuss. He is a long-term user of CPAP therapy, presented after a three-year gap since the last consultation. The primary concern was discomfort with the current CPAP mask, described as causing pain in the nasal area, suggesting a possible issue with mask size or fit. The patient also reported issues with mask leakage and worn-out chin straps, indicating a need for new CPAP supplies. Notably, the patient had not changed the mask or other CPAP components for approximately three years, significantly exceeding the recommended replacement intervals. He does feel like he sleeps well with CPAP and is rested for the most part. He denies any drowsy driving or sleep parasomnias/paralysis.  He goes to bed around 10:30 PM.  Falls asleep within 10 to 15 minutes.  Does not usually wake up throughout the night.  Gets up around 5:45 AM.  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  Has never had any difficulties with this.  His weight is down 30 pounds over the last 2 years.  Last sleep study was at [facility] in 2012.  Currently on CPAP auto 10-15 cmH2O with nasal mask.  Does not wear any supplemental oxygen.  In addition to the CPAP-related concerns, the patient reported increasing shortness of breath over the past year, particularly when climbing stairs or walking long distances. This symptom was accompanied by a sensation of numbness in the  legs and feet during prolonged walking.  He has also had episodes of heart racing and dizziness with the shortness of breath.  The patient also reported a chronic cough, which varies between dry and productive.  He does have chronic allergy symptoms, which is what he is mostly related his cough to.  Over the past year though he has had a few bouts of bronchitis.  He was most recently treated in October by his PCP for a sinobronchitis.  He has felt better since then and feels like most of his sinus and cough  symptoms are back to his baseline.  He does notice an occasional wheeze. He denies any fevers, chills, hemoptysis. He does have a rescue inhaler, which he occasionally uses. Sometimes helps and other times does not.   The patient also has a significant cardiac history, including palpitations and CAD s/p stents 2020. He struggles with swelling in his legs, which has been worse over the last year. His boots and socks always leave indentations. Denies any orthopnea, PND, CP.   He has had some recent weight loss since the loss of his wife a few years ago. He moved his mother in and they have changed their eating habits so he attributes the weight loss to this.   The patient has a pet bird at home and works in an environment with exposure to various dust and fumes. The patient also reported a history of childhood asthma and allergies, and is currently using Flonase for sinus issues. Used to take Claritin D, which his PCP recommended he stop due to his cardiac history. He's not tried any without the decongestants.   Epworth 15  ILD Exposure Questionnaire  Pets: Pet bird  Occupation: Technical sales engineer  Exposures: Multiple fumes/dust.  No mold, hot tub, Jacuzzi.  No down pillows or comforter.  Smoking history: 60 pack year hx; quit 2012 Travel history: no recent travel  Relevant family history: No significant family history of lung disease  09/21/2023-10/20/2023: CPAP 10-15 cmH2O 29/30 days; >4 hr 93%; average use 6 hr 57 min Pressure 95th 12.1 Leaks 95th 27.1 AHI 0.6      Allergies  Allergen Reactions   Atorvastatin Other (See Comments)    Myalgias   Rosuvastatin Other (See Comments)    Myalgias    Immunization History  Administered Date(s) Administered   Tdap 11/10/2018    Past Medical History:  Diagnosis Date   Angina pectoris (HCC) 06/04/2019   Arthritis    Benign essential HTN 02/10/2016   Bronchospasm with bronchitis, acute 12/14/2013   Coronary artery disease  status post PTCA and drug-eluting stent to circumflex artery in July 2020 06/26/2019   Dyslipidemia 09/24/2020   GAD (generalized anxiety disorder) 12/14/2013   GERD (gastroesophageal reflux disease)    occ   History of kidney stones    Hypertension 12/14/2013   Hypogonadism male 12/14/2013   Kidney stones 02/10/2016   Myelopathy (HCC) 06/03/2014   Obesity    OSA (obstructive sleep apnea) 02/10/2016   Shortness of breath    occ-allergies   Sleep apnea    cpap 16 yrs    Tobacco History: Social History   Tobacco Use  Smoking Status Former   Current packs/day: 0.00   Average packs/day: 2.0 packs/day for 30.0 years (60.0 ttl pk-yrs)   Types: Cigarettes   Start date: 05/15/1981   Quit date: 05/16/2011   Years since quitting: 12.4  Smokeless Tobacco Never   Counseling given: Not Answered   Outpatient Medications Prior  to Visit  Medication Sig Dispense Refill   acetaminophen (TYLENOL) 500 MG tablet Take 1,000 mg by mouth daily.     albuterol (VENTOLIN HFA) 108 (90 Base) MCG/ACT inhaler Inhale 2 puffs into the lungs every 6 (six) hours as needed for wheezing or shortness of breath. 8 g 0   ALPRAZolam (XANAX) 1 MG tablet Take 1 tablet (1 mg total) by mouth 3 (three) times daily as needed for anxiety. 90 tablet 0   aspirin EC 81 MG tablet Take 1 tablet (81 mg total) by mouth daily. 90 tablet 3   Bempedoic Acid-Ezetimibe (NEXLIZET) 180-10 MG TABS Take 1 tablet by mouth daily. 90 tablet 3   busPIRone (BUSPAR) 5 MG tablet Take 1 tablet (5 mg total) by mouth 2 (two) times daily. 60 tablet 1   celecoxib (CELEBREX) 200 MG capsule Take 1 capsule (200 mg total) by mouth daily. 90 capsule 2   fluticasone (FLONASE) 50 MCG/ACT nasal spray Place 2 sprays into both nostrils daily. 16 g 1   furosemide (LASIX) 20 MG tablet Take 1 tablet by mouth once daily 90 tablet 1   losartan-hydrochlorothiazide (HYZAAR) 100-25 MG tablet Take 1 tablet by mouth daily. 90 tablet 1   nitroGLYCERIN (NITROSTAT) 0.4 MG SL  tablet Place 1 tablet (0.4 mg total) under the tongue every 5 (five) minutes as needed for chest pain. 5 tablet 0   Omega-3 Fatty Acids (OMEGA-3 FISH OIL PO) Take 1 tablet by mouth daily.     ranolazine (RANEXA) 1000 MG SR tablet Take 1 tablet by mouth twice daily 180 tablet 0   sildenafil (VIAGRA) 50 MG tablet Take 1 tablet (50 mg total) by mouth daily as needed for erectile dysfunction. 5 tablet 3   No facility-administered medications prior to visit.     Review of Systems:   Constitutional: No night sweats, fevers, chills, fatigue, or lassitude. +weight loss  HEENT: No headaches, difficulty swallowing, tooth/dental problems, or sore throat. No sneezing, itching, ear ache. +nasal congestion, post nasal drip CV:  +swelling in lower extremities, dizziness, palpitations. No chest pain, orthopnea, PND, anasarca, syncope Resp: +shortness of breath with exertion; chronic cough; occasional wheeze. No excess mucus or change in color of mucus. No hemoptysis. No chest wall deformity GI:  No heartburn, indigestion, abdominal pain, nausea, vomiting, diarrhea, change in bowel habits, loss of appetite, bloody stools.  GU: No dysuria, change in color of urine, urgency or frequency.  No flank pain, no hematuria  Skin: No rash, lesions, ulcerations MSK:  No joint pain or swelling.  No decreased range of motion.  No back pain. Neuro: No dizziness or lightheadedness.  Psych: No depression or anxiety. Mood stable.     Physical Exam:  BP 120/70 (BP Location: Right Arm, Cuff Size: Large)   Pulse (!) 53   Ht 5\' 5"  (1.651 m)   Wt 220 lb (99.8 kg)   SpO2 96%   BMI 36.61 kg/m   GEN: Pleasant, interactive, well-kempt; obese; in no acute distress HEENT:  Normocephalic and atraumatic. PERRLA. Sclera white. Nasal turbinates boggy, moist and patent bilaterally. Clear rhinorrhea present. Oropharynx pink and moist, without exudate or edema. No lesions, ulcerations NECK:  Supple w/ fair ROM. No JVD present.  Normal carotid impulses w/o bruits. Thyroid symmetrical with no goiter or nodules palpated. Cervical lymphadenopathy  CV: RRR, no m/r/g, no peripheral edema. Pulses intact, +2 bilaterally. No cyanosis, pallor or clubbing. PULMONARY:  Unlabored, regular breathing. Diminished bibasilar airflow otherwise clear bilaterally A&P w/o wheezes/rales/rhonchi. No accessory  muscle use.  GI: BS present and normoactive. Soft, non-tender to palpation. No organomegaly or masses detected.  MSK: No erythema, warmth or tenderness. Cap refil <2 sec all extrem. No deformities or joint swelling noted.  Neuro: A/Ox3. No focal deficits noted.   Skin: Warm, no lesions or rashe Psych: Normal affect and behavior. Judgement and thought content appropriate.     Lab Results:  CBC    Component Value Date/Time   WBC 8.7 01/18/2021 1350   RBC 4.64 01/18/2021 1350   HGB 14.7 01/18/2021 1350   HGB 14.8 06/15/2019 0955   HCT 42.8 01/18/2021 1350   HCT 44.4 06/15/2019 0955   PLT 301 01/18/2021 1350   PLT 278 06/15/2019 0955   MCV 92.2 01/18/2021 1350   MCV 92 06/15/2019 0955   MCH 31.7 01/18/2021 1350   MCHC 34.3 01/18/2021 1350   RDW 12.7 01/18/2021 1350   RDW 12.7 06/15/2019 0955   LYMPHSABS 2.6 11/10/2018 1640   MONOABS 1.0 11/10/2018 1640   EOSABS 0.1 11/10/2018 1640   BASOSABS 0.0 11/10/2018 1640    BMET    Component Value Date/Time   NA 137 01/18/2021 1350   NA 138 06/15/2019 0955   K 3.7 01/18/2021 1350   CL 102 01/18/2021 1350   CO2 25 01/18/2021 1350   GLUCOSE 106 (H) 01/18/2021 1350   BUN 18 01/18/2021 1350   BUN 15 06/15/2019 0955   CREATININE 0.97 01/18/2021 1350   CALCIUM 9.2 01/18/2021 1350   GFRNONAA >60 01/18/2021 1350   GFRAA 110 06/15/2019 0955    BNP No results found for: "BNP"   Imaging:  No results found.  Administration History     None           No data to display          No results found for: "NITRICOXIDE"      Assessment & Plan:       Obstructive Sleep Apnea Managed with CPAP. Excellent compliance and control. Receives benefit from use. Moderate leaks. Reports discomfort and leakage with current mask, and has not replaced CPAP supplies in years. Discussed importance of regular replacement to prevent discomfort, optimize management, and avoid adverse reactions. Verbalized understanding. Safe driving practices reviewed. Continued healthy weight loss - ensure to monitor pressure settings/control with ongoing weight loss.  - Order new CPAP supplies: mask, chin strap, hose - Instruct on regular replacement - Provide written instructions on replacement schedule - Continue CPAP nightly  DOE Increasing dyspnea over the past year, especially with exertion. Unclear etiology. He has multiple associated symptoms. Needs workup for underlying pulmonary and cardiac etiologies. He is a former heavy smoker. High suspicion for smoking related lung disease. He has a history of asthma as a child and significant allergy symptoms so possible asthma overlap. Uses albuterol inhaler with some relief. He does also have a significant cardiac history, severe sleep apnea with BLE edema. Will obtain echocardiogram for further evaluation to assess for CHF/PH component.  There is also concern for ILD related to bird and/or occupational exposures given his symptoms and history. He will need CT of the chest for further evaluation. This will also evaluate for any suspicious nodules/masses with his former smoking history.  Recommend lab work today to rule out anemia, thyroid disease, kidney disease. Will also obtain IgE to assess for allergic phenotype.  - Order pulmonary function testing - Order chest x-ray - Schedule echocardiogram - Schedule CT chest with contrast  - Order lab work: CBC, BMET, BNP,  TSH, IgE - Advise on albuterol inhaler use as needed - Consider maintenance inhaler therapy following PFT, dependent on results - Walk test without desaturations    Chronic Sinusitis/Allergic rhinitis  Recovering from recent sinusitis. Clinically improving, still with residual cervical lymphadenopathy. Attention on follow up. Uses OTC guaifenesin DM and Flonase intermittently. Advised on saline nasal rinses and regular Flonase use.  - Recommend saline nasal rinses twice daily - Advise on regular Flonase use 20-30 minutes after saline rinse  Former smoker 60 pack year history. Quit 2012. See above  Follow-up - Schedule follow-up visit after pulmonary function testing and echocardiogram - Review lab results and imaging studies at next visit.        Advised if symptoms do not improve or worsen, to please contact office for sooner follow up or seek emergency care.   I spent 55 minutes of dedicated to the care of this patient on the date of this encounter to include pre-visit review of records, face-to-face time with the patient discussing conditions above, post visit ordering of testing, clinical documentation with the electronic health record, making appropriate referrals as documented, and communicating necessary findings to members of the patients care team.  Noemi Chapel, NP 10/23/2023  Pt aware and understands NP's role.

## 2023-10-23 NOTE — Patient Instructions (Addendum)
Continue Albuterol inhaler 2 puffs every 6 hours as needed for shortness of breath or wheezing. Notify if symptoms persist despite rescue inhaler/neb use.  Continue flonase nasal spray 2 sprays each nostril daily Use saline rinses 1-2 times a day then follow with the flonase in the morning  Take claritin (not the D) once daily for allergies/sinus symptoms   Labs and x ray today  CT chest ordered - someone should contact you for scheduling   Echocardiogram ordered - someone will contact you for scheduling  Pulmonary function testing ordered for further evaluation. Do not use albuterol the day of your study  Continue to use CPAP every night, minimum of 4-6 hours a night.  Change equipment as directed. Wash your tubing with warm soap and water daily, hang to dry. Wash humidifier portion weekly. Use bottled, distilled water and change daily Be aware of reduced alertness and do not drive or operate heavy machinery if experiencing this or drowsiness.  Exercise encouraged, as tolerated. Healthy weight management discussed.  Avoid or decrease alcohol consumption and medications that make you more sleepy, if possible. Notify if persistent daytime sleepiness occurs even with consistent use of PAP therapy.  Orders sent for new CPAP supplies  Change supplies... Every month Mask cushions and/or nasal pillows CPAP machine filters Every 3 months Mask frame (not including the headgear) CPAP tubing Every 6 months Mask headgear Chin strap (if applicable) Humidifier water tub  Follow up in 6-8 weeks after above testing is completed with Dr. Vassie Loll. If symptoms do not improve or worsen, please contact office for sooner follow up or seek emergency care.
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
## 2023-11-07 ENCOUNTER — Ambulatory Visit: Payer: BC Managed Care – PPO

## 2023-11-07 ENCOUNTER — Ambulatory Visit: Payer: BC Managed Care – PPO | Attending: Rheumatology | Admitting: Rheumatology

## 2023-11-07 ENCOUNTER — Encounter: Payer: Self-pay | Admitting: Rheumatology

## 2023-11-07 ENCOUNTER — Ambulatory Visit
Admission: RE | Admit: 2023-11-07 | Discharge: 2023-11-07 | Disposition: A | Discharge status: Home / Self Care | Payer: BC Managed Care – PPO | Source: Ambulatory Visit | Attending: Nurse Practitioner | Admitting: Nurse Practitioner

## 2023-11-07 VITALS — BP 137/71 | HR 57 | Resp 16 | Ht 66.0 in | Wt 234.4 lb

## 2023-11-07 DIAGNOSIS — M25562 Pain in left knee: Secondary | ICD-10-CM

## 2023-11-07 DIAGNOSIS — M25511 Pain in right shoulder: Secondary | ICD-10-CM | POA: Diagnosis not present

## 2023-11-07 DIAGNOSIS — Z6836 Body mass index (BMI) 36.0-36.9, adult: Secondary | ICD-10-CM

## 2023-11-07 DIAGNOSIS — G8929 Other chronic pain: Secondary | ICD-10-CM

## 2023-11-07 DIAGNOSIS — M25561 Pain in right knee: Secondary | ICD-10-CM | POA: Diagnosis not present

## 2023-11-07 DIAGNOSIS — R5383 Other fatigue: Secondary | ICD-10-CM | POA: Diagnosis not present

## 2023-11-07 DIAGNOSIS — M47816 Spondylosis without myelopathy or radiculopathy, lumbar region: Secondary | ICD-10-CM

## 2023-11-07 DIAGNOSIS — M79642 Pain in left hand: Secondary | ICD-10-CM

## 2023-11-07 DIAGNOSIS — R0609 Other forms of dyspnea: Secondary | ICD-10-CM | POA: Diagnosis not present

## 2023-11-07 DIAGNOSIS — M79641 Pain in right hand: Secondary | ICD-10-CM | POA: Diagnosis not present

## 2023-11-07 DIAGNOSIS — I700 Atherosclerosis of aorta: Secondary | ICD-10-CM | POA: Diagnosis not present

## 2023-11-07 DIAGNOSIS — I10 Essential (primary) hypertension: Secondary | ICD-10-CM

## 2023-11-07 DIAGNOSIS — M2142 Flat foot [pes planus] (acquired), left foot: Secondary | ICD-10-CM

## 2023-11-07 DIAGNOSIS — M2141 Flat foot [pes planus] (acquired), right foot: Secondary | ICD-10-CM

## 2023-11-07 DIAGNOSIS — E66812 Obesity, class 2: Secondary | ICD-10-CM

## 2023-11-07 DIAGNOSIS — E291 Testicular hypofunction: Secondary | ICD-10-CM

## 2023-11-07 DIAGNOSIS — N200 Calculus of kidney: Secondary | ICD-10-CM

## 2023-11-07 DIAGNOSIS — M25512 Pain in left shoulder: Secondary | ICD-10-CM

## 2023-11-07 DIAGNOSIS — I2510 Atherosclerotic heart disease of native coronary artery without angina pectoris: Secondary | ICD-10-CM

## 2023-11-07 DIAGNOSIS — R918 Other nonspecific abnormal finding of lung field: Secondary | ICD-10-CM | POA: Diagnosis not present

## 2023-11-07 DIAGNOSIS — M5030 Other cervical disc degeneration, unspecified cervical region: Secondary | ICD-10-CM

## 2023-11-07 DIAGNOSIS — E785 Hyperlipidemia, unspecified: Secondary | ICD-10-CM

## 2023-11-07 DIAGNOSIS — S68113A Complete traumatic metacarpophalangeal amputation of left middle finger, initial encounter: Secondary | ICD-10-CM

## 2023-11-07 DIAGNOSIS — M2550 Pain in unspecified joint: Secondary | ICD-10-CM

## 2023-11-07 DIAGNOSIS — R0602 Shortness of breath: Secondary | ICD-10-CM

## 2023-11-07 DIAGNOSIS — F411 Generalized anxiety disorder: Secondary | ICD-10-CM

## 2023-11-07 DIAGNOSIS — G4733 Obstructive sleep apnea (adult) (pediatric): Secondary | ICD-10-CM

## 2023-11-07 MED ORDER — IOPAMIDOL (ISOVUE-300) INJECTION 61%
75.0000 mL | Freq: Once | INTRAVENOUS | Status: AC | PRN
  Administered 2023-11-07: 75 mL via INTRAVENOUS

## 2023-11-07 NOTE — Patient Instructions (Signed)
Shoulder Exercises Ask your health care provider which exercises are safe for you. Do exercises exactly as told by your health care provider and adjust them as directed. It is normal to feel mild stretching, pulling, tightness, or discomfort as you do these exercises. Stop right away if you feel sudden pain or your pain gets worse. Do not begin these exercises until told by your health care provider. Stretching exercises External rotation and abduction This exercise is sometimes called corner stretch. The exercise rotates your arm outward (external rotation) and moves your arm out from your body (abduction). Stand in a doorway with one of your feet slightly in front of the other. This is called a staggered stance. If you cannot reach your forearms to the door frame, stand facing a corner of a room. Choose one of the following positions as told by your health care provider: Place your hands and forearms on the door frame above your head. Place your hands and forearms on the door frame at the height of your head. Place your hands on the door frame at the height of your elbows. Slowly move your weight onto your front foot until you feel a stretch across your chest and in the front of your shoulders. Keep your head and chest upright and keep your abdominal muscles tight. Hold for __________ seconds. To release the stretch, shift your weight to your back foot. Repeat __________ times. Complete this exercise __________ times a day. Extension, standing  Stand and hold a broomstick, a cane, or a similar object behind your back. Your hands should be a little wider than shoulder-width apart. Your palms should face away from your back. Keeping your elbows straight and your shoulder muscles relaxed, move the stick away from your body until you feel a stretch in your shoulders (extension). Avoid shrugging your shoulders while you move the stick. Keep your shoulder blades tucked down toward the middle of your  back. Hold for __________ seconds. Slowly return to the starting position. Repeat __________ times. Complete this exercise __________ times a day. Range-of-motion exercises Pendulum  Stand near a wall or a surface that you can hold onto for balance. Bend at the waist and let your left / right arm hang straight down. Use your other arm to support you. Keep your back straight and do not lock your knees. Relax your left / right arm and shoulder muscles, and move your hips and your trunk so your left / right arm swings freely. Your arm should swing because of the motion of your body, not because you are using your arm or shoulder muscles. Keep moving your hips and trunk so your arm swings in the following directions, as told by your health care provider: Side to side. Forward and backward. In clockwise and counterclockwise circles. Continue each motion for __________ seconds, or for as long as told by your health care provider. Slowly return to the starting position. Repeat __________ times. Complete this exercise __________ times a day. Shoulder flexion, standing  Stand and hold a broomstick, a cane, or a similar object. Place your hands a little more than shoulder-width apart on the object. Your left / right hand should be palm-up, and your other hand should be palm-down. Keep your elbow straight and your shoulder muscles relaxed. Push the stick up with your healthy arm to raise your left / right arm in front of your body, and then over your head until you feel a stretch in your shoulder (flexion). Avoid shrugging your shoulder  while you raise your arm. Keep your shoulder blade tucked down toward the middle of your back. Hold for __________ seconds. Slowly return to the starting position. Repeat __________ times. Complete this exercise __________ times a day. Shoulder abduction, standing  Stand and hold a broomstick, a cane, or a similar object. Place your hands a little more than  shoulder-width apart on the object. Your left / right hand should be palm-up, and your other hand should be palm-down. Keep your elbow straight and your shoulder muscles relaxed. Push the object across your body toward your left / right side. Raise your left / right arm to the side of your body (abduction) until you feel a stretch in your shoulder. Do not raise your arm above shoulder height unless your health care provider tells you to do that. If directed, raise your arm over your head. Avoid shrugging your shoulder while you raise your arm. Keep your shoulder blade tucked down toward the middle of your back. Hold for __________ seconds. Slowly return to the starting position. Repeat __________ times. Complete this exercise __________ times a day. Internal rotation  Place your left / right hand behind your back, palm-up. Use your other hand to dangle an exercise band, a broomstick, or a similar object over your shoulder. Grasp the band with your left / right hand so you are holding on to both ends. Gently pull up on the band until you feel a stretch in the front of your left / right shoulder. The movement of your arm toward the center of your body is called internal rotation. Avoid shrugging your shoulder while you raise your arm. Keep your shoulder blade tucked down toward the middle of your back. Hold for __________ seconds. Release the stretch by letting go of the band and lowering your hands. Repeat __________ times. Complete this exercise __________ times a day. Strengthening exercises External rotation  Sit in a stable chair without armrests. Secure an exercise band to a stable object at elbow height on your left / right side. Place a soft object, such as a folded towel or a small pillow, between your left / right upper arm and your body to move your elbow about 4 inches (10 cm) away from your side. Hold the end of the exercise band so it is tight and there is no slack. Keeping your  elbow pressed against the soft object, slowly move your forearm out, away from your abdomen (external rotation). Keep your body steady so only your forearm moves. Hold for __________ seconds. Slowly return to the starting position. Repeat __________ times. Complete this exercise __________ times a day. Shoulder abduction  Sit in a stable chair without armrests, or stand up. Hold a __________ lb / kg weight in your left / right hand, or hold an exercise band with both hands. Start with your arms straight down and your left / right palm facing in, toward your body. Slowly lift your left / right hand out to your side (abduction). Do not lift your hand above shoulder height unless your health care provider tells you that this is safe. Keep your arms straight. Avoid shrugging your shoulder while you do this movement. Keep your shoulder blade tucked down toward the middle of your back. Hold for __________ seconds. Slowly lower your arm, and return to the starting position. Repeat __________ times. Complete this exercise __________ times a day. Shoulder extension  Sit in a stable chair without armrests, or stand up. Secure an exercise band to a  stable object in front of you so it is at shoulder height. Hold one end of the exercise band in each hand. Straighten your elbows and lift your hands up to shoulder height. Squeeze your shoulder blades together as you pull your hands down to the sides of your thighs (extension). Stop when your hands are straight down by your sides. Do not let your hands go behind your body. Hold for __________ seconds. Slowly return to the starting position. Repeat __________ times. Complete this exercise __________ times a day. Shoulder row  Sit in a stable chair without armrests, or stand up. Secure an exercise band to a stable object in front of you so it is at chest height. Hold one end of the exercise band in each hand. Position your palms so that your thumbs are  facing the ceiling (neutral position). Bend each of your elbows to a 90-degree angle (right angle) and keep your upper arms at your sides. Step back or move the chair back until the band is tight and there is no slack. Slowly pull your elbows back behind you. Hold for __________ seconds. Slowly return to the starting position. Repeat __________ times. Complete this exercise __________ times a day. Shoulder press-ups  Sit in a stable chair that has armrests. Sit upright, with your feet flat on the floor. Put your hands on the armrests so your elbows are bent and your fingers are pointing forward. Your hands should be about even with the sides of your body. Push down on the armrests and use your arms to lift yourself off the chair. Straighten your elbows and lift yourself up as much as you comfortably can. Move your shoulder blades down, and avoid letting your shoulders move up toward your ears. Keep your feet on the ground. As you get stronger, your feet should support less of your body weight as you lift yourself up. Hold for __________ seconds. Slowly lower yourself back into the chair. Repeat __________ times. Complete this exercise __________ times a day. Wall push-ups  Stand so you are facing a stable wall. Your feet should be about one arm-length away from the wall. Lean forward and place your palms on the wall at shoulder height. Keep your feet flat on the floor as you bend your elbows and lean forward toward the wall. Hold for __________ seconds. Straighten your elbows to push yourself back to the starting position. Repeat __________ times. Complete this exercise __________ times a day. This information is not intended to replace advice given to you by your health care provider. Make sure you discuss any questions you have with your health care provider. Document Revised: 01/02/2022 Document Reviewed: 01/02/2022 [employer] Patient Education  2024 [employer] Hand Exercises Hand  exercises can be helpful for almost anyone. They can strengthen your hands and improve flexibility and movement. The exercises can also increase blood flow to the hands. These results can make your work and daily tasks easier for you. Hand exercises can be especially helpful for people who have joint pain from arthritis or nerve damage from using their hands over and over. These exercises can also help people who injure a hand. Exercises Most of these hand exercises are gentle stretching and motion exercises. It is usually safe to do them often throughout the day. Warming up your hands before exercise may help reduce stiffness. You can do this with gentle massage or by placing your hands in warm water for 10-15 minutes. It is normal to feel some stretching, pulling,  tightness, or mild discomfort when you begin new exercises. In time, this will improve. Remember to always be careful and stop right away if you feel sudden, very bad pain or your pain gets worse. You want to get better and be safe. Ask your health care provider which exercises are safe for you. Do exercises exactly as told by your provider and adjust them as told. Do not begin these exercises until told by your provider. Knuckle bend or "claw" fist  Stand or sit with your arm, hand, and all five fingers pointed straight up. Make sure to keep your wrist straight. Gently bend your fingers down toward your palm until the tips of your fingers are touching your palm. Keep your big knuckle straight and only bend the small knuckles in your fingers. Hold this position for 10 seconds. Straighten your fingers back to your starting position. Repeat this exercise 5-10 times with each hand. Full finger fist  Stand or sit with your arm, hand, and all five fingers pointed straight up. Make sure to keep your wrist straight. Gently bend your fingers into your palm until the tips of your fingers are touching the middle of your palm. Hold this position  for 10 seconds. Extend your fingers back to your starting position, stretching every joint fully. Repeat this exercise 5-10 times with each hand. Straight fist  Stand or sit with your arm, hand, and all five fingers pointed straight up. Make sure to keep your wrist straight. Gently bend your fingers at the big knuckle, where your fingers meet your hand, and at the middle knuckle. Keep the knuckle at the tips of your fingers straight and try to touch the bottom of your palm. Hold this position for 10 seconds. Extend your fingers back to your starting position, stretching every joint fully. Repeat this exercise 5-10 times with each hand. Tabletop  Stand or sit with your arm, hand, and all five fingers pointed straight up. Make sure to keep your wrist straight. Gently bend your fingers at the big knuckle, where your fingers meet your hand, as far down as you can. Keep the small knuckles in your fingers straight. Think of forming a tabletop with your fingers. Hold this position for 10 seconds. Extend your fingers back to your starting position, stretching every joint fully. Repeat this exercise 5-10 times with each hand. Finger spread  Place your hand flat on a table with your palm facing down. Make sure your wrist stays straight. Spread your fingers and thumb apart from each other as far as you can until you feel a gentle stretch. Hold this position for 10 seconds. Bring your fingers and thumb tight together again. Hold this position for 10 seconds. Repeat this exercise 5-10 times with each hand. Making circles  Stand or sit with your arm, hand, and all five fingers pointed straight up. Make sure to keep your wrist straight. Make a circle by touching the tip of your thumb to the tip of your index finger. Hold for 10 seconds. Then open your hand wide. Repeat this motion with your thumb and each of your fingers. Repeat this exercise 5-10 times with each hand. Thumb motion  Sit with your  forearm resting on a table and your wrist straight. Your thumb should be facing up toward the ceiling. Keep your fingers relaxed as you move your thumb. Lift your thumb up as high as you can toward the ceiling. Hold for 10 seconds. Bend your thumb across your palm as far as  you can, reaching the tip of your thumb for the small finger (pinkie) side of your palm. Hold for 10 seconds. Repeat this exercise 5-10 times with each hand. Grip strengthening  Hold a stress ball or other soft ball in the middle of your hand. Slowly increase the pressure, squeezing the ball as much as you can without causing pain. Think of bringing the tips of your fingers into the middle of your palm. All of your finger joints should bend when doing this exercise. Hold your squeeze for 10 seconds, then relax. Repeat this exercise 5-10 times with each hand. Contact a health care provider if: Your hand pain or discomfort gets much worse when you do an exercise. Your hand pain or discomfort does not improve within 2 hours after you exercise. If you have either of these problems, stop doing these exercises right away. Do not do them again unless your provider says that you can. Get help right away if: You develop sudden, severe hand pain or swelling. If this happens, stop doing these exercises right away. Do not do them again unless your provider says that you can. This information is not intended to replace advice given to you by your health care provider. Make sure you discuss any questions you have with your health care provider. Document Revised: 11/27/2022 Document Reviewed: 11/27/2022 Else[employer] Patient Education  2024 [employer] Exercises for Chronic Knee Pain Chronic knee pain is pain that lasts longer than 3 months. For most people with chronic knee pain, exercise and weight loss is an important part of treatment. Your health care provider may want you to focus on: Making the muscles that support your knee stronger.  This can take pressure off your knee and reduce pain. Preventing knee stiffness. How far you can move your knee, keeping it there or making it farther. Losing weight (if this applies) to take pressure off your knee, lower your risk for injury, and make it easier for you to exercise. Your provider will help you make an exercise program that fits your needs and physical abilities. Below are simple, low-impact exercises you can do at home. Ask your provider or physical therapist how often you should do your exercise program and how many times to repeat each exercise. General safety tips  Get your provider's approval before doing any exercises. Start slowly and stop any time you feel pain. Do not exercise if your knee pain is flaring up. Warm up first. Stretching a cold muscle can cause an injury. Do 5-10 minutes of easy movement or light stretching before beginning your exercises. Do 5-10 minutes of low-impact activity (like walking or cycling) before starting strengthening exercises. Contact your provider any time you have pain during or after exercising. Exercise can cause discomfort but should not be painful. It is normal to be a little stiff or sore after exercising. Stretching and range-of-motion exercises Front thigh stretch  Stand up straight and support your body by holding on to a chair or resting one hand on a wall. With your legs straight and close together, bend one knee to lift your heel up toward your butt. Using one hand for support, grab your ankle with your free hand. Pull your foot up closer toward your butt to feel the stretch in front of your thigh. Hold the stretch for 30 seconds. Repeat __________ times. Complete this exercise __________ times a day. Back thigh stretch  Sit on the floor with your back straight and your legs out straight in front of  you. Place the palms of your hands on the floor and slide them toward your feet as you bend at the hip. Try to touch your  nose to your knees and feel the stretch in the back of your thighs. Hold for 30 seconds. Repeat __________ times. Complete this exercise __________ times a day. Calf stretch  Stand facing a wall. Place the palms of your hands flat against the wall, arms extended, and lean slightly against the wall. Get into a lunge position with one leg bent at the knee and the other leg stretched out straight behind you. Keep both feet facing the wall and increase the bend in your knee while keeping the heel of the other leg flat on the ground. You should feel the stretch in your calf. Hold for 30 seconds. Repeat __________ times. Complete this exercise __________ times a day. Strengthening exercises Straight leg lift  Lie on your back with one knee bent and the other leg out straight. Slowly lift the straight leg without bending the knee. Lift until your foot is about 12 inches (30 cm) off the floor. Hold for 3-5 seconds and slowly lower your leg. Repeat __________ times. Complete this exercise __________ times a day. Single leg dip  Stand between two chairs and put both hands on the backs of the chairs for support. Extend one leg out straight with your body weight resting on the heel of the standing leg. Slowly bend your standing knee to dip your body to the level that is comfortable for you. Hold for 3-5 seconds. Repeat __________ times. Complete this exercise __________ times a day. Hamstring curls  Stand straight, knees close together, facing the back of a chair. Hold on to the back of a chair with both hands. Keep one leg straight. Bend the other knee while bringing the heel up toward the butt until the knee is bent at a 90-degree angle (right angle). Hold for 3-5 seconds. Repeat __________ times. Complete this exercise __________ times a day. Wall squat  Stand straight with your back, hips, and head against a wall. Step forward one foot at a time with your back still against the  wall. Your feet should be 2 feet (61 cm) from the wall at shoulder width. Keeping your back, hips, and head against the wall, slide down the wall to as close to a sitting position as you can get. Hold for 5-10 seconds, then slowly slide back up. Repeat __________ times. Complete this exercise __________ times a day. Step-ups  Stand in front of a sturdy platform or stool that is about 6 inches (15 cm) high. Slowly step up with your left / right foot, keeping your knee in line with your hip and foot. Do not let your knee bend so far that you cannot see your toes. Hold on to a chair for balance, but do not use it for support. Slowly unlock your knee and lower yourself to the starting position. Repeat __________ times. Complete this exercise __________ times a day. Contact a health care provider if: Your exercises cause pain. Your pain is worse after you exercise. Your pain prevents you from doing your exercises. This information is not intended to replace advice given to you by your health care provider. Make sure you discuss any questions you have with your health care provider. Document Revised: 11/27/2022 Document Reviewed: 11/27/2022 Elsevier Patient Education  2024 [employer].

## 2023-11-09 ENCOUNTER — Other Ambulatory Visit: Payer: Self-pay | Admitting: Cardiology

## 2023-11-09 DIAGNOSIS — I2510 Atherosclerotic heart disease of native coronary artery without angina pectoris: Secondary | ICD-10-CM

## 2023-11-11 LAB — CBC WITH DIFFERENTIAL/PLATELET
Absolute Lymphocytes: 1748 {cells}/uL (ref 850–3900)
Absolute Monocytes: 775 {cells}/uL (ref 200–950)
Basophils Absolute: 38 {cells}/uL (ref 0–200)
Basophils Relative: 0.5 %
Eosinophils Absolute: 198 {cells}/uL (ref 15–500)
Eosinophils Relative: 2.6 %
HCT: 42.5 % (ref 38.5–50.0)
Hemoglobin: 13.9 g/dL (ref 13.2–17.1)
MCH: 32.3 pg (ref 27.0–33.0)
MCHC: 32.7 g/dL (ref 32.0–36.0)
MCV: 98.8 fL (ref 80.0–100.0)
MPV: 10 fL (ref 7.5–12.5)
Monocytes Relative: 10.2 %
Neutro Abs: 4841 {cells}/uL (ref 1500–7800)
Neutrophils Relative %: 63.7 %
Platelets: 276 10*3/uL (ref 140–400)
RBC: 4.3 10*6/uL (ref 4.20–5.80)
RDW: 12.4 % (ref 11.0–15.0)
Total Lymphocyte: 23 %
WBC: 7.6 10*3/uL (ref 3.8–10.8)

## 2023-11-11 LAB — COMPLETE METABOLIC PANEL WITH GFR
AG Ratio: 1.6 (calc) (ref 1.0–2.5)
ALT: 11 U/L (ref 9–46)
AST: 12 U/L (ref 10–35)
Albumin: 4.1 g/dL (ref 3.6–5.1)
Alkaline phosphatase (APISO): 67 U/L (ref 35–144)
BUN: 14 mg/dL (ref 7–25)
CO2: 28 mmol/L (ref 20–32)
Calcium: 9.5 mg/dL (ref 8.6–10.3)
Chloride: 106 mmol/L (ref 98–110)
Creat: 0.94 mg/dL (ref 0.70–1.35)
Globulin: 2.6 g/dL (ref 1.9–3.7)
Glucose, Bld: 87 mg/dL (ref 65–99)
Potassium: 4.1 mmol/L (ref 3.5–5.3)
Sodium: 140 mmol/L (ref 135–146)
Total Bilirubin: 0.4 mg/dL (ref 0.2–1.2)
Total Protein: 6.7 g/dL (ref 6.1–8.1)
eGFR: 92 mL/min/{1.73_m2} (ref >=60)

## 2023-11-11 LAB — SEDIMENTATION RATE: Sed Rate: 11 mm/h (ref 0–20)

## 2023-11-11 LAB — CYCLIC CITRUL PEPTIDE ANTIBODY, IGG: Cyclic Citrullin Peptide Ab: <16 U

## 2023-11-11 LAB — RHEUMATOID FACTOR: Rheumatoid fact SerPl-aCnc: 23 [IU]/mL — ABNORMAL HIGH (ref <14)

## 2023-11-11 LAB — URIC ACID: Uric Acid, Serum: 4.5 mg/dL (ref 4.0–8.0)

## 2023-11-11 MED ORDER — RANOLAZINE ER 1000 MG PO TB12: 1000.0000 mg | ORAL_TABLET | Freq: Two times a day (BID) | ORAL | 0 refills | Status: DC

## 2023-11-11 NOTE — Progress Notes (Signed)
CBC, CMP, sed rate, uric acid, anti-CCP are all within normal limits.  Rheumatoid factor is low titer positive.  I will discuss results at the follow-up visit.

## 2023-11-12 ENCOUNTER — Telehealth: Payer: Self-pay | Admitting: *Deleted

## 2023-11-12 NOTE — Telephone Encounter (Signed)
Patient's daughter contacted the office and left message requesting a call back.  Returned call to [place]. She asked if anyone from our office called patient today. Advised we have not reached out to the patient. Advised we would discuss lab results at his follow up visit.

## 2023-11-14 ENCOUNTER — Ambulatory Visit (HOSPITAL_BASED_OUTPATIENT_CLINIC_OR_DEPARTMENT_OTHER)
Admission: RE | Admit: 2023-11-14 | Discharge: 2023-11-14 | Disposition: A | Discharge status: Home / Self Care | Payer: BC Managed Care – PPO | Source: Ambulatory Visit | Attending: Nurse Practitioner | Admitting: Nurse Practitioner

## 2023-11-14 DIAGNOSIS — R0609 Other forms of dyspnea: Secondary | ICD-10-CM | POA: Diagnosis not present

## 2023-11-14 DIAGNOSIS — G4733 Obstructive sleep apnea (adult) (pediatric): Secondary | ICD-10-CM | POA: Diagnosis not present

## 2023-11-14 LAB — ECHOCARDIOGRAM COMPLETE
AR max vel: 2.31 cm2
AV Area VTI: 2.6 cm2
AV Area mean vel: 2.31 cm2
AV Mean grad: 5 mm[Hg]
AV Peak grad: 10.5 mm[Hg]
Ao pk vel: 1.62 m/s
Area-P 1/2: 4.1 cm2
Calc EF: 62.7 %
S' Lateral: 3.6 cm
Single Plane A2C EF: 60.1 %
Single Plane A4C EF: 63.9 %
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
## 2023-11-28 ENCOUNTER — Other Ambulatory Visit: Payer: Self-pay | Admitting: Nurse Practitioner

## 2023-11-28 DIAGNOSIS — J849 Interstitial pulmonary disease, unspecified: Secondary | ICD-10-CM

## 2023-11-28 DIAGNOSIS — Z7729 Contact with and (suspected ) exposure to other hazardous substances: Secondary | ICD-10-CM

## 2023-12-06 ENCOUNTER — Ambulatory Visit: Payer: BC Managed Care – PPO | Attending: Rheumatology | Admitting: Rheumatology

## 2023-12-06 ENCOUNTER — Telehealth: Payer: Self-pay | Admitting: *Deleted

## 2023-12-06 ENCOUNTER — Encounter: Payer: Self-pay | Admitting: Family Medicine

## 2023-12-06 ENCOUNTER — Encounter: Payer: Self-pay | Admitting: Rheumatology

## 2023-12-06 VITALS — BP 138/73 | HR 58 | Resp 14 | Ht 66.0 in | Wt 239.0 lb

## 2023-12-06 DIAGNOSIS — N200 Calculus of kidney: Secondary | ICD-10-CM

## 2023-12-06 DIAGNOSIS — E291 Testicular hypofunction: Secondary | ICD-10-CM

## 2023-12-06 DIAGNOSIS — M19042 Primary osteoarthritis, left hand: Secondary | ICD-10-CM

## 2023-12-06 DIAGNOSIS — F411 Generalized anxiety disorder: Secondary | ICD-10-CM

## 2023-12-06 DIAGNOSIS — E785 Hyperlipidemia, unspecified: Secondary | ICD-10-CM

## 2023-12-06 DIAGNOSIS — I10 Essential (primary) hypertension: Secondary | ICD-10-CM

## 2023-12-06 DIAGNOSIS — M2142 Flat foot [pes planus] (acquired), left foot: Secondary | ICD-10-CM

## 2023-12-06 DIAGNOSIS — M25511 Pain in right shoulder: Secondary | ICD-10-CM | POA: Diagnosis not present

## 2023-12-06 DIAGNOSIS — G4733 Obstructive sleep apnea (adult) (pediatric): Secondary | ICD-10-CM

## 2023-12-06 DIAGNOSIS — M2550 Pain in unspecified joint: Secondary | ICD-10-CM | POA: Diagnosis not present

## 2023-12-06 DIAGNOSIS — G8929 Other chronic pain: Secondary | ICD-10-CM

## 2023-12-06 DIAGNOSIS — E66812 Obesity, class 2: Secondary | ICD-10-CM

## 2023-12-06 DIAGNOSIS — M2141 Flat foot [pes planus] (acquired), right foot: Secondary | ICD-10-CM

## 2023-12-06 DIAGNOSIS — Z6836 Body mass index (BMI) 36.0-36.9, adult: Secondary | ICD-10-CM

## 2023-12-06 DIAGNOSIS — S68113A Complete traumatic metacarpophalangeal amputation of left middle finger, initial encounter: Secondary | ICD-10-CM | POA: Diagnosis not present

## 2023-12-06 DIAGNOSIS — I2510 Atherosclerotic heart disease of native coronary artery without angina pectoris: Secondary | ICD-10-CM

## 2023-12-06 DIAGNOSIS — M19041 Primary osteoarthritis, right hand: Secondary | ICD-10-CM

## 2023-12-06 DIAGNOSIS — M25512 Pain in left shoulder: Secondary | ICD-10-CM

## 2023-12-06 DIAGNOSIS — M170 Bilateral primary osteoarthritis of knee: Secondary | ICD-10-CM

## 2023-12-06 DIAGNOSIS — M5030 Other cervical disc degeneration, unspecified cervical region: Secondary | ICD-10-CM

## 2023-12-06 DIAGNOSIS — R0602 Shortness of breath: Secondary | ICD-10-CM

## 2023-12-06 DIAGNOSIS — M47816 Spondylosis without myelopathy or radiculopathy, lumbar region: Secondary | ICD-10-CM

## 2023-12-06 NOTE — Patient Instructions (Signed)
 Exercises for Chronic Knee Pain  Chronic knee pain is pain that lasts longer than 3 months. For most people with chronic knee pain, exercise and weight loss is an important part of treatment. Your health care provider may want you to focus on:  Making the muscles that support your knee stronger. This can take pressure off your knee and reduce pain.  Preventing knee stiffness.  How far you can move your knee, keeping it there or making it farther.  Losing weight (if this applies) to take pressure off your knee, lower your risk for injury, and make it easier for you to exercise.  Your provider will help you make an exercise program that fits your needs and physical abilities. Below are simple, low-impact exercises you can do at home. Ask your provider or physical therapist how often you should do your exercise program and how many times to repeat each exercise.  General safety tips    Get your provider's approval before doing any exercises.  Start slowly and stop any time you feel pain.  Do not exercise if your knee pain is flaring up.  Warm up first. Stretching a cold muscle can cause an injury. Do 5-10 minutes of easy movement or light stretching before beginning your exercises.  Do 5-10 minutes of low-impact activity (like walking or cycling) before starting strengthening exercises.  Contact your provider any time you have pain during or after exercising. Exercise can cause discomfort but should not be painful. It is normal to be a little stiff or sore after exercising.  Stretching and range-of-motion exercises  Front thigh stretch    Stand up straight and support your body by holding on to a chair or resting one hand on a Mastandrea.  With your legs straight and close together, bend one knee to lift your heel up toward your butt.  Using one hand for support, grab your ankle with your free hand.  Pull your foot up closer toward your butt to feel the stretch in front of your thigh.  Hold the stretch for 30  seconds.  Repeat __________ times. Complete this exercise __________ times a day.  Back thigh stretch    Sit on the floor with your back straight and your legs out straight in front of you.  Place the palms of your hands on the floor and slide them toward your feet as you bend at the hip.  Try to touch your nose to your knees and feel the stretch in the back of your thighs.  Hold for 30 seconds.  Repeat __________ times. Complete this exercise __________ times a day.  Calf stretch    Stand facing a Sheehy.  Place the palms of your hands flat against the Ghazi, arms extended, and lean slightly against the Muha.  Get into a lunge position with one leg bent at the knee and the other leg stretched out straight behind you.  Keep both feet facing the Ziesmer and increase the bend in your knee while keeping the heel of the other leg flat on the ground.  You should feel the stretch in your calf. Hold for 30 seconds.  Repeat __________ times. Complete this exercise __________ times a day.  Strengthening exercises  Straight leg lift    Lie on your back with one knee bent and the other leg out straight.  Slowly lift the straight leg without bending the knee.  Lift until your foot is about 12 inches (30 cm) off the floor.  Hold for  3-5 seconds and slowly lower your leg.  Repeat __________ times. Complete this exercise __________ times a day.  Single leg dip    Stand between two chairs and put both hands on the backs of the chairs for support.  Extend one leg out straight with your body weight resting on the heel of the standing leg.  Slowly bend your standing knee to dip your body to the level that is comfortable for you.  Hold for 3-5 seconds.  Repeat __________ times. Complete this exercise __________ times a day.  Hamstring curls    Stand straight, knees close together, facing the back of a chair.  Hold on to the back of a chair with both hands.  Keep one leg straight. Bend the other knee while bringing the heel up toward the butt  until the knee is bent at a 90-degree angle (right angle).  Hold for 3-5 seconds.  Repeat __________ times. Complete this exercise __________ times a day.  Neece squat    Stand straight with your back, hips, and head against a Rushing.  Step forward one foot at a time with your back still against the Spargur.  Your feet should be 2 feet (61 cm) from the Jeanpaul at shoulder width. Keeping your back, hips, and head against the Colligan, slide down the Brenner to as close to a sitting position as you can get.  Hold for 5-10 seconds, then slowly slide back up.  Repeat __________ times. Complete this exercise __________ times a day.  Step-ups    Stand in front of a sturdy platform or stool that is about 6 inches (15 cm) high.  Slowly step up with your left / right foot, keeping your knee in line with your hip and foot. Do not let your knee bend so far that you cannot see your toes. Hold on to a chair for balance, but do not use it for support.  Slowly unlock your knee and lower yourself to the starting position.  Repeat __________ times. Complete this exercise __________ times a day.  Contact a health care provider if:  Your exercises cause pain.  Your pain is worse after you exercise.  Your pain prevents you from doing your exercises.  This information is not intended to replace advice given to you by your health care provider. Make sure you discuss any questions you have with your health care provider.  Document Revised: 11/27/2022 Document Reviewed: 11/27/2022  Elsevier Patient Education  2024 [employer].

## 2023-12-06 NOTE — Telephone Encounter (Signed)
 Please apply for visco bil knees, OA. Thank you.

## 2023-12-10 NOTE — Telephone Encounter (Signed)
 VOB submitted for Orthovisc, Bilateral knee(s) BV pending

## 2023-12-15 DIAGNOSIS — G4733 Obstructive sleep apnea (adult) (pediatric): Secondary | ICD-10-CM | POA: Diagnosis not present

## 2023-12-20 ENCOUNTER — Telehealth (INDEPENDENT_AMBULATORY_CARE_PROVIDER_SITE_OTHER): Payer: BC Managed Care – PPO | Admitting: Family Medicine

## 2023-12-20 ENCOUNTER — Encounter: Payer: Self-pay | Admitting: Family Medicine

## 2023-12-20 DIAGNOSIS — J0100 Acute maxillary sinusitis, unspecified: Secondary | ICD-10-CM | POA: Diagnosis not present

## 2023-12-20 MED ORDER — AMOXICILLIN-POT CLAVULANATE 875-125 MG PO TABS: 1.0000 | ORAL_TABLET | Freq: Two times a day (BID) | ORAL | 0 refills | Status: AC

## 2023-12-20 MED ORDER — PREDNISONE 20 MG PO TABS: 40.0000 mg | ORAL_TABLET | Freq: Every day | ORAL | 0 refills | Status: AC

## 2023-12-20 NOTE — Progress Notes (Signed)
Chief Complaint  Patient presents with   Sinus Problem    Discuss sinus and congestion    Chinita Pester here for URI complaints. We are interacting via web portal for an electronic face-to-face visit. I verified patient's ID using 2 identifiers. Patient agreed to proceed with visit via this method. Patient is at work, I am at office. Patient and I are present for visit.   Duration: 1 week; getting worse for past 2 d. Associated symptoms: sinus congestion, sinus pain, rhinorrhea, sore throat, wheezing, shortness of breath, myalgia, and pain in upper gums Denies: ear pain, ear drainage, sore throat, itchy/watery eyes, and fevers Treatment to date: OTC decongestant Sick contacts: Yes; mom  Past Medical History:  Diagnosis Date   Angina pectoris (HCC) 06/04/2019   Arthritis    Benign essential HTN 02/10/2016   Bronchospasm with bronchitis, acute 12/14/2013   Coronary artery disease status post PTCA and drug-eluting stent to circumflex artery in July 2020 06/26/2019   Dyslipidemia 09/24/2020   GAD (generalized anxiety disorder) 12/14/2013   GERD (gastroesophageal reflux disease)    occ   History of kidney stones    Hypertension 12/14/2013   Hypogonadism male 12/14/2013   Kidney stones 02/10/2016   Myelopathy (HCC) 06/03/2014   Obesity    OSA (obstructive sleep apnea) 02/10/2016   Shortness of breath    occ-allergies   Sleep apnea    cpap 16 yrs    Objective We are interacting via web portal for an electronic face-to-face visit. I verified patient's ID using 2 identifiers. Patient agreed to proceed with visit via this method. Patient is at home, I am at office. Patient and I are present for visit.   Acute maxillary sinusitis, recurrence not specified - Plan: amoxicillin-clavulanate (AUGMENTIN) 875-125 MG tablet, predniSONE (DELTASONE) 20 MG tablet  5 d pred burst 40 mg/d. Augmentin for 7 d starting tomorrow if still worsening. Continue to push fluids, practice good hand hygiene, cover  mouth when coughing. Sees cards and pulm for chronic heart/lung issues.  F/u prn. If starting to experience fevers, shaking, or worsening shortness of breath, seek immediate care. Pt voiced understanding and agreement to the plan.  Jilda Roche [place], DO 12/20/23 8:46 AM

## 2023-12-23 NOTE — Telephone Encounter (Signed)
Prior authorization required through patient's plan PA submitted & pending

## 2023-12-24 ENCOUNTER — Encounter: Payer: Self-pay | Admitting: Cardiology

## 2023-12-24 ENCOUNTER — Ambulatory Visit: Payer: BC Managed Care – PPO | Attending: Cardiology | Admitting: Cardiology

## 2023-12-24 VITALS — BP 114/76 | HR 60 | Ht 66.0 in | Wt 238.0 lb

## 2023-12-24 DIAGNOSIS — E785 Hyperlipidemia, unspecified: Secondary | ICD-10-CM | POA: Diagnosis not present

## 2023-12-24 DIAGNOSIS — I25118 Atherosclerotic heart disease of native coronary artery with other forms of angina pectoris: Secondary | ICD-10-CM

## 2023-12-24 DIAGNOSIS — I10 Essential (primary) hypertension: Secondary | ICD-10-CM

## 2023-12-24 MED ORDER — ISOSORBIDE MONONITRATE ER 30 MG PO TB24: 30.0000 mg | ORAL_TABLET | Freq: Every day | ORAL | 3 refills | Status: DC

## 2023-12-24 NOTE — Patient Instructions (Addendum)
Medication Instructions:   START: Imdur 30mg  1 tablet daily   Lab Work: None Ordered If you have labs (blood work) drawn today and your tests are completely normal, you will receive your results only by: MyChart Message (if you have MyChart) OR A paper copy in the mail If you have any lab test that is abnormal or we need to change your treatment, we will call you to review the results.   Testing/Procedures: None Ordered   Follow-Up: At [facility], you and your health needs are our priority.  As part of our continuing mission to provide you with exceptional heart care, we have created designated Provider Care Teams.  These Care Teams include your primary Cardiologist (physician) and Advanced Practice Providers (APPs -  Physician Assistants and Nurse Practitioners) who all work together to provide you with the care you need, when you need it.  We recommend signing up for the patient portal called "MyChart".  Sign up information is provided on this After Visit Summary.  MyChart is used to connect with patients for Virtual Visits (Telemedicine).  Patients are able to view lab/test results, encounter notes, upcoming appointments, etc.  Non-urgent messages can be sent to your provider as well.   To learn more about what you can do with MyChart, go to [email or web address].    Your next appointment:   6 month(s)  The format for your next appointment:   In Person  Provider:   [name], MD    Other Instructions Referral to Lipid clinic- they will call for appt

## 2023-12-24 NOTE — Telephone Encounter (Signed)
Please call to schedule visco injections.  Approved for Orthovisc, Bilateral knee(s). [employer] Stable Deductible does not apply Once the OOP has been met $8000 (met $70) patient is covered at 100% $70 co-pay Authorization # [id number] Approval dates:  12/23/2023 to 06/20/2024

## 2023-12-24 NOTE — Progress Notes (Unsigned)
Cardiology Office Note:    Date:  12/24/2023   ID:  Corey Corey [name], Corey Corey [name] December 16, 1960, MRN [id number]  PCP:  [name], DO  Cardiologist:  [name], MD    Referring MD: [name]*   Chief Complaint  Patient presents with   Shortness of Breath         History of Present Illness:    Corey Corey [name] is a 63 y.o. Corey [name]    with past medical history significant for coronary artery disease.  In July 2020 he required PTCA and stenting to the circumflex artery for 99% stenosis.  At the same time he was find to have completely occluded right coronary artery with good collateralization.  He also have essential hypertension as well as dyslipidemia.  He came to me last time in about a month ago complaining of one episode of chest pain he was working very hard in the garden he actually ended going to the emergency room.  Biochemical markers were negative he was sent to [place] for follow-up.  He ended up having a stress test done and test show ischemia involving inferior wall which is in correlation to his completely occluded right coronary artery.  I did put him on appropriate medication he is doing clinically much better.  Comes today to months for follow-up he did have some cold-like symptoms and sinusitis.  That being treated getting better.  Described to have some episode of chest pain about once a day.  Otherwise seems to be doing well  Past Medical History:  Diagnosis Date   Angina pectoris (HCC) 06/04/2019   Arthritis    Benign essential HTN 02/10/2016   Bronchospasm with bronchitis, acute 12/14/2013   Coronary artery disease status post PTCA and drug-eluting stent to circumflex artery in July 2020 06/26/2019   Dyslipidemia 09/24/2020   GAD (generalized anxiety disorder) 12/14/2013   GERD (gastroesophageal reflux disease)    occ   History of kidney stones    Hypertension 12/14/2013   Hypogonadism Corey [name] 12/14/2013   Kidney stones 02/10/2016   Myelopathy (HCC)  06/03/2014   Obesity    OSA (obstructive sleep apnea) 02/10/2016   Shortness of breath    occ-allergies   Sleep apnea    cpap 16 yrs    Past Surgical History:  Procedure Laterality Date   ANTERIOR CERVICAL DECOMP/DISCECTOMY FUSION N/A 06/03/2014   Procedure: ANTERIOR CERVICAL DECOMPRESSION FUSION, CERVICAL FIVE-SIX, CERVICAL SIX-SEVEN WITH INSTRUMENTATION AND ALLOGRAFT;  Surgeon: Emilee Hero, MD;  Location: [facility] OR;  Service: Orthopedics;  Laterality: N/A;   CORONARY STENT INTERVENTION N/A 06/19/2019   Procedure: CORONARY STENT INTERVENTION;  Surgeon: Swaziland, Peter M, MD;  Location: [facility] INVASIVE CV LAB;  Service: Cardiovascular;  Laterality: N/A;   I & D EXTREMITY Left 11/10/2018   Procedure: IRRIGATION AND DEBRIDEMENT EXTREMITY, REVISION AMPUTATION OF LEFT RING FINGER;  Surgeon: Mack Hook, MD;  Location: [facility] OR;  Service: Orthopedics;  Laterality: Left;   LEFT HEART CATH AND CORONARY ANGIOGRAPHY N/A 06/19/2019   Procedure: LEFT HEART CATH AND CORONARY ANGIOGRAPHY;  Surgeon: Swaziland, Peter M, MD;  Location: [facility] INVASIVE CV LAB;  Service: Cardiovascular;  Laterality: N/A;   URETHRAL DILATION     child    Current Medications: Current Meds  Medication Sig   acetaminophen (TYLENOL) 650 MG CR tablet Take 650 mg by mouth at bedtime.   albuterol (VENTOLIN HFA) 108 (90 Base) MCG/ACT inhaler Inhale 2 puffs into the lungs every 6 (six) hours as needed for wheezing or  shortness of breath.   ALPRAZolam (XANAX) 1 MG tablet Take 1 tablet (1 mg total) by mouth 3 (three) times daily as needed for anxiety.   amoxicillin-clavulanate (AUGMENTIN) 875-125 MG tablet Take 1 tablet by mouth 2 (two) times daily for 7 days.   aspirin EC 81 MG tablet Take 1 tablet (81 mg total) by mouth daily.   busPIRone (BUSPAR) 5 MG tablet Take 1 tablet (5 mg total) by mouth 2 (two) times daily.   celecoxib (CELEBREX) 200 MG capsule Take 1 capsule (200 mg total) by mouth daily.   fluticasone (FLONASE) 50 MCG/ACT nasal  spray Place 2 sprays into both nostrils daily.   furosemide (LASIX) 20 MG tablet Take 1 tablet by mouth once daily   losartan-hydrochlorothiazide (HYZAAR) 100-25 MG tablet Take 1 tablet by mouth daily.   nitroGLYCERIN (NITROSTAT) 0.4 MG SL tablet Place 1 tablet (0.4 mg total) under the tongue every 5 (five) minutes as needed for chest pain.   Omega-3 Fatty Acids (OMEGA-3 FISH OIL PO) Take 1 tablet by mouth daily.   predniSONE (DELTASONE) 20 MG tablet Take 2 tablets (40 mg total) by mouth daily with breakfast for 5 days.   ranolazine (RANEXA) 1000 MG SR tablet Take 1 tablet (1,000 mg total) by mouth 2 (two) times daily.   sildenafil (VIAGRA) 50 MG tablet Take 1 tablet (50 mg total) by mouth daily as needed for erectile dysfunction.   [DISCONTINUED] Bempedoic Acid-Ezetimibe (NEXLIZET) 180-10 MG TABS Take 1 tablet by mouth daily.     Allergies:   Atorvastatin and Rosuvastatin   Social History   Socioeconomic History   Marital status: Widowed    Spouse name: Not on file   Number of children: Not on file   Years of education: Not on file   Highest education level: Not on file  Occupational History   Not on file  Tobacco Use   Smoking status: Former    Current packs/day: 0.00    Average packs/day: 2.0 packs/day for 30.0 years (60.0 ttl pk-yrs)    Types: Cigarettes    Start date: 05/15/1981    Quit date: 05/16/2011    Years since quitting: 12.6    Passive exposure: Past   Smokeless tobacco: Never  Vaping Use   Vaping status: Former  Substance and Sexual Activity   Alcohol use: Yes    Comment: occ   Drug use: No   Sexual activity: Not on file  Other Topics Concern   Not on file  Social History Narrative   Not on file   Social Drivers of Health   Financial Resource Strain: Not on file  Food Insecurity: Not on file  Transportation Needs: Not on file  Physical Activity: Not on file  Stress: Not on file  Social Connections: Not on file     Family History: The patient's  family history includes Arrhythmia in his mother; CAD (age of onset: 50) in his father; Diabetes in his mother; Healthy in his daughter; Heart failure in his mother. ROS:   Please see the history of present illness.    All 14 point review of systems negative except as described per history of present illness  EKGs/Labs/Other Studies Reviewed:    EKG Interpretation Date/Time:  Tuesday December 24 2023 15:58:08 EST Ventricular Rate:  66 PR Interval:  146 QRS Duration:  88 QT Interval:  404 QTC Calculation: 423 R Axis:   11  Text Interpretation: Normal sinus rhythm Normal ECG When compared with ECG of 18-Jan-2021 12:40, No  significant change was found Confirmed by [name] [phone]) on 12/24/2023 4:16:12 PM    Recent Labs: 11/07/2023: ALT 11; BUN 14; Creat 0.94; Hemoglobin 13.9; Platelets 276; Potassium 4.1; Sodium 140  Recent Lipid Panel    Component Value Date/Time   CHOL 189 12/18/2022 0919   TRIG 145 12/18/2022 0919   HDL 45 12/18/2022 0919   CHOLHDL 4.2 12/18/2022 0919   LDLCALC 118 (H) 12/18/2022 0919   LDLDIRECT 108 (H) 03/18/2020 1556    Physical Exam:    VS:  BP 114/76 (BP Location: Right Arm, Patient Position: Sitting)   Pulse 60   Ht 5\' 6"  (1.676 m)   Wt 238 lb (108 kg)   SpO2 91%   BMI 38.41 kg/m     Wt Readings from Last 3 Encounters:  12/24/23 238 lb (108 kg)  12/06/23 239 lb (108.4 kg)  11/07/23 234 lb 6.4 oz (106.3 kg)     GEN:  Well nourished, well developed in no acute distress HEENT: Normal NECK: No JVD; No carotid bruits LYMPHATICS: No lymphadenopathy CARDIAC: RRR, no murmurs, no rubs, no gallops RESPIRATORY:  Clear to auscultation without rales, wheezing or rhonchi  ABDOMEN: Soft, non-tender, non-distended MUSCULOSKELETAL:  No edema; No deformity  SKIN: Warm and dry LOWER EXTREMITIES: no swelling NEUROLOGIC:  Alert and oriented x 3 PSYCHIATRIC:  Normal affect   ASSESSMENT:    1. Benign essential HTN   2. Coronary artery disease of  native artery of native heart with stable angina pectoris (HCC)   3. Dyslipidemia    PLAN:    In order of problems listed above:  Coronary disease stable angina pectoris.  Will add Imdur 30 to medical regiment will continue rest of the medication. Dyslipidemia I did review K PN LDL 118 HDL 45, intolerant to statin, cannot afford Nexlizet at, we will refer him to our lipid clinic for consideration of PCSK9 agent. Benign essential hypertension blood pressure well-controlled we will continue present management   Medication Adjustments/Labs and Tests Ordered: Current medicines are reviewed at length with the patient today.  Concerns regarding medicines are outlined above.  Orders Placed This Encounter  Procedures   EKG 12-Lead   Medication changes: No orders of the defined types were placed in this encounter.   Signed, Georgeanna Lea, MD, [facility] 12/24/2023 4:29 PM    [facility]

## 2023-12-27 ENCOUNTER — Telehealth: Payer: Self-pay

## 2023-12-27 ENCOUNTER — Telehealth: Payer: Self-pay | Admitting: Pharmacy Technician

## 2023-12-27 ENCOUNTER — Other Ambulatory Visit (HOSPITAL_COMMUNITY): Payer: Self-pay

## 2023-12-27 NOTE — Telephone Encounter (Signed)
You  Prevatt, Tiburcio Pea, CMAJust now (3:16 PM)    Hi, I dont see that key in [facility] and it said in the last dr note on 12/24/23 said: "Dyslipidemia I did review K PN LDL 118 HDL 45, intolerant to statin, cannot afford Nexlizet at, we will refer him to our lipid clinic for consideration of PCSK9 agent." So it appears the patient is not going to be on this now?   Prevatt, Tiburcio Pea, CMA  Rx Prior Auth Team59 minutes ago (2:17 PM)    Prior Auth for [employer] Key: [employer] asking about this message sent

## 2023-12-30 ENCOUNTER — Other Ambulatory Visit: Payer: Self-pay | Admitting: Family Medicine

## 2023-12-30 ENCOUNTER — Other Ambulatory Visit: Payer: Self-pay | Admitting: Cardiology

## 2023-12-31 ENCOUNTER — Ambulatory Visit (HOSPITAL_BASED_OUTPATIENT_CLINIC_OR_DEPARTMENT_OTHER): Payer: BC Managed Care – PPO | Admitting: Pulmonary Disease

## 2023-12-31 ENCOUNTER — Encounter (HOSPITAL_BASED_OUTPATIENT_CLINIC_OR_DEPARTMENT_OTHER): Payer: Self-pay | Admitting: Pulmonary Disease

## 2023-12-31 VITALS — BP 122/70 | HR 65 | Ht 65.0 in | Wt 234.8 lb

## 2023-12-31 DIAGNOSIS — R0609 Other forms of dyspnea: Secondary | ICD-10-CM

## 2023-12-31 DIAGNOSIS — Z87891 Personal history of nicotine dependence: Secondary | ICD-10-CM

## 2023-12-31 DIAGNOSIS — I2510 Atherosclerotic heart disease of native coronary artery without angina pectoris: Secondary | ICD-10-CM

## 2023-12-31 DIAGNOSIS — G4733 Obstructive sleep apnea (adult) (pediatric): Secondary | ICD-10-CM

## 2023-12-31 LAB — PULMONARY FUNCTION TEST
DL/VA % pred: 122 %
DL/VA: 5.26 ml/min/mmHg/L
DLCO cor % pred: 115 %
DLCO cor: 26.88 ml/min/mmHg
DLCO unc % pred: 113 %
DLCO unc: 26.33 ml/min/mmHg
FEF 25-75 Post: 2.19 L/s
FEF 25-75 Pre: 1.9 L/s
FEF2575-%Change-Post: 15 %
FEF2575-%Pred-Post: 90 %
FEF2575-%Pred-Pre: 78 %
FEV1-%Change-Post: 6 %
FEV1-%Pred-Post: 79 %
FEV1-%Pred-Pre: 74 %
FEV1-Post: 2.33 L
FEV1-Pre: 2.18 L
FEV1FVC-%Change-Post: 2 %
FEV1FVC-%Pred-Pre: 102 %
FEV6-%Change-Post: 4 %
FEV6-%Pred-Post: 79 %
FEV6-%Pred-Pre: 75 %
FEV6-Post: 2.94 L
FEV6-Pre: 2.8 L
FEV6FVC-%Pred-Post: 105 %
FEV6FVC-%Pred-Pre: 105 %
FVC-%Change-Post: 4 %
FVC-%Pred-Post: 75 %
FVC-%Pred-Pre: 72 %
FVC-Post: 2.95 L
FVC-Pre: 2.82 L
Post FEV1/FVC ratio: 79 %
Post FEV6/FVC ratio: 100 %
Pre FEV1/FVC ratio: 77 %
Pre FEV6/FVC Ratio: 100 %
RV % pred: 141 %
RV: 2.84 L
TLC % pred: 101 %
TLC: 6.06 L

## 2023-12-31 NOTE — Progress Notes (Signed)
 Full PFT Performed Today

## 2023-12-31 NOTE — Patient Instructions (Addendum)
CT shows changes of smoking & exposure  PFTs ok  Trial of airfit F30 FF mask

## 2023-12-31 NOTE — Patient Instructions (Signed)
 Full PFT Performed Today

## 2023-12-31 NOTE — Progress Notes (Signed)
 Subjective:    Patient ID: Corey [name], male    DOB: Aug 17, 1961, 63 y.o.   MRN: [id number]  HPI  63 year old man for follow-up of OSA and dyspnea. He was seen as an initial consult by APP on 10/23/23  PMH -2 V CAD s/p stent ot Lcx, RCA 100%, -hyperlipidemia   ILD Exposure Questionnaire  Pets: Pet bird  Occupation: Technical sales engineer  Exposures: Multiple fumes/dust.  No mold, hot tub, Jacuzzi.  No down pillows or comforter.  Smoking history: 60 pack year hx; quit 2012 No significant family history of lung disease  Discussed the use of AI scribe software for clinical note transcription with the patient, who gave verbal consent to proceed.  History of Present Illness   Corey [name], a patient with a history of sleep apnea, shortness of breath, and exertion, presents with ongoing shortness of breath, particularly when climbing stairs. The patient reports having to stop between flights of stairs due to difficulty breathing. The patient denies wheezing but describes a sensation of hindered breathing and a feeling of lagging since having COVID three times. The patient also mentions occasional chest discomfort, which he attributes to a blocked artery in his heart.  The patient has a history of heavy smoking, approximately three packs a day, but quit about twelve to thirteen years ago. 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 The patient has been using a CPAP machine for sleep apnea and has been compliant with its use, averaging about seven hours per night. However, he has been experiencing issues with mask fit and leakage, which he believes may be contributing to daytime sleepiness.       CPAP download shows excellent control of events on auto settings 8 to 15 cm with average pressure 12.4 and maximum pressure 13.4 cm.  He is very compliant with usage more than 7 hours per night has developed a mild leak over the  last few weeks.  CPAP is only helped improve his daytime somnolence and fatigue  Significant tests/ events reviewed  Pulmonary function test: 79% FEV1, nml ratio  100-110% DLCO (10/2023) Echocardiogram: Normal left ventricular ejection fraction (10/2023) Cardiac catheterization: 100% occlusion in right coronary artery with collateral circulation, 90% occlusion in left coronary artery with stent (12/24/2023)  CT chest w con 10/2023 >>  Diffuse bilateral bronchial wall thickening. Background of fine centrilobular nodularity, most concentrated in the lung apices. Minimal diffuse mosaic attenuation of the airspaces.   NPSG 03/2011-240 pounds AHI 95/hour, lowest desaturation 81% CPAP titrated to 14 cm with nasal mask and chinstrap  Review of Systems neg for any significant sore throat, dysphagia, itching, sneezing, nasal congestion or excess/ purulent secretions, fever, chills, sweats, unintended wt loss, pleuritic or exertional cp, hempoptysis, orthopnea pnd or change in chronic leg swelling. Also denies presyncope, palpitations, heartburn, abdominal pain, nausea, vomiting, diarrhea or change in bowel or urinary habits, dysuria,hematuria, rash, arthralgias, visual complaints, headache, numbness weakness or ataxia.     Objective:   Physical Exam  Gen. Pleasant, obese, in no distress ENT - no lesions, no post nasal drip Neck: No JVD, no thyromegaly, no carotid bruits Lungs: no use of accessory muscles, no dullness to percussion, decreased without rales or rhonchi  Cardiovascular: Rhythm regular, heart sounds  normal, no murmurs or gallops, no peripheral edema Musculoskeletal: No deformities, no cyanosis or clubbing , no tremors       Assessment & Plan:    Assessment and  Plan    Shortness of Breath on Exertion Chronic dyspnea on exertion, CT findings likely related to past smoking and coronary artery disease. Pulmonary function tests show 79% of expected lung function, indicating no  significant COPD. CT scan shows airway thickening without major emphysema or fibrosis. Cardiac evaluation reveals a 100% blocked right coronary artery with collateral circulation. No additional respiratory medications recommended; albuterol  as needed for wheezing. - Continue current medications including Ranexa  for chest discomfort - Use albuterol  as needed for wheezing  OSA on CPAP Chronic condition managed with CPAP therapy. Current settings (10-15 cm H2O) are effective, averaging 7 hours of use per night. Recent mask leakage causing nasal irritation. Discussed switching to a new mask design. Further adjustments or different mask types may be needed if issues persist. - Switch to a new mask design to address leakage and nasal irritation - Follow up in 6 months to reassess CPAP efficacy and mask fit - Contact if new CPAP mask does not resolve leakage issues  Coronary Artery Disease Coronary artery disease with a 100% blocked right coronary artery and a stent in the left coronary artery. Occasional chest pain managed with Ranexa . Recent echocardiogram shows good heart function. No intervention for the blocked artery due to collateral circulation; intervention could cause more harm than benefit. - Continue current medications including Ranexa  - No intervention for the 100% blocked artery - Follow up with cardiologist   General Health Maintenance Former heavy smoker, quit 12-13 years ago. No significant findings on recent lung function tests and CT scan. 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 Encouraged to maintain regular physical activity and continue smoking cessation. - Encourage continued smoking cessation - Maintain regular physical activity  Follow-up - Follow up in 6 months.

## 2023-12-31 NOTE — Telephone Encounter (Signed)
 Noted

## 2024-01-13 NOTE — Telephone Encounter (Signed)
 LMOM to schedule Orthovisc injections

## 2024-01-15 DIAGNOSIS — G4733 Obstructive sleep apnea (adult) (pediatric): Secondary | ICD-10-CM | POA: Diagnosis not present

## 2024-02-04 ENCOUNTER — Other Ambulatory Visit: Payer: Self-pay | Admitting: Cardiology

## 2024-02-04 DIAGNOSIS — I2510 Atherosclerotic heart disease of native coronary artery without angina pectoris: Secondary | ICD-10-CM

## 2024-02-05 ENCOUNTER — Encounter: Payer: Self-pay | Admitting: *Deleted

## 2024-02-05 ENCOUNTER — Ambulatory Visit: Payer: BC Managed Care – PPO | Attending: Physician Assistant | Admitting: Physician Assistant

## 2024-02-05 DIAGNOSIS — M170 Bilateral primary osteoarthritis of knee: Secondary | ICD-10-CM

## 2024-02-05 MED ORDER — LIDOCAINE HCL 1 % IJ SOLN
1.5000 mL | INTRAMUSCULAR | Status: AC | PRN
  Administered 2024-02-05: 1.5 mL

## 2024-02-05 MED ORDER — HYALURONAN 30 MG/2ML IX SOSY
30.0000 mg | PREFILLED_SYRINGE | INTRA_ARTICULAR | Status: AC | PRN
  Administered 2024-02-05: 30 mg via INTRA_ARTICULAR

## 2024-02-05 MED ORDER — LIDOCAINE HCL 1 % IJ SOLN
1.5000 mL | INTRAMUSCULAR | Status: AC | PRN
Start: 2024-02-05 — End: 2024-02-05
  Administered 2024-02-05: 1.5 mL

## 2024-02-05 NOTE — Progress Notes (Signed)
   Procedure Note  Patient: Corey [name]             Date of Birth: [birth year]           MRN: [id number]             Visit Date: 02/05/2024  Procedures: Visit Diagnoses:  1. Primary osteoarthritis of both knees     Orthovisc #1 bilateral knees, B/B Large Joint Inj: bilateral knee on 02/05/2024 3:00 PM Indications: pain Details: 25 G 1.5 in needle, medial approach  Arthrogram: No  Medications (Right): 1.5 mL lidocaine 1 %; 30 mg Hyaluronan 30 MG/2ML Aspirate (Right): 0 mL Medications (Left): 1.5 mL lidocaine 1 %; 30 mg Hyaluronan 30 MG/2ML Aspirate (Left): 0 mL Outcome: tolerated well, no immediate complications Procedure, treatment alternatives, risks and benefits explained, specific risks discussed. Consent was given by the patient. Immediately prior to procedure a time out was called to verify the correct patient, procedure, equipment, support staff and site/side marked as required.    Patient tolerated the procedures well.  Aftercare was discussed.  Patient was provided a work note to excuse him tomorrow if needed.   Sherron Ales, PA-C

## 2024-02-09 ENCOUNTER — Other Ambulatory Visit: Payer: Self-pay | Admitting: Cardiology

## 2024-02-09 ENCOUNTER — Other Ambulatory Visit: Payer: Self-pay | Admitting: Family Medicine

## 2024-02-09 ENCOUNTER — Emergency Department (HOSPITAL_BASED_OUTPATIENT_CLINIC_OR_DEPARTMENT_OTHER)
Admission: EM | Admit: 2024-02-09 | Discharge: 2024-02-09 | Disposition: A | Discharge status: Home / Self Care | Attending: Emergency Medicine | Admitting: Emergency Medicine

## 2024-02-09 ENCOUNTER — Other Ambulatory Visit: Payer: Self-pay

## 2024-02-09 ENCOUNTER — Emergency Department (HOSPITAL_BASED_OUTPATIENT_CLINIC_OR_DEPARTMENT_OTHER)

## 2024-02-09 ENCOUNTER — Encounter (HOSPITAL_BASED_OUTPATIENT_CLINIC_OR_DEPARTMENT_OTHER): Payer: Self-pay | Admitting: Emergency Medicine

## 2024-02-09 DIAGNOSIS — M1712 Unilateral primary osteoarthritis, left knee: Secondary | ICD-10-CM | POA: Diagnosis not present

## 2024-02-09 DIAGNOSIS — M19072 Primary osteoarthritis, left ankle and foot: Secondary | ICD-10-CM | POA: Diagnosis not present

## 2024-02-09 DIAGNOSIS — F411 Generalized anxiety disorder: Secondary | ICD-10-CM

## 2024-02-09 DIAGNOSIS — W293XXA Contact with powered garden and outdoor hand tools and machinery, initial encounter: Secondary | ICD-10-CM | POA: Insufficient documentation

## 2024-02-09 DIAGNOSIS — Z7982 Long term (current) use of aspirin: Secondary | ICD-10-CM | POA: Diagnosis not present

## 2024-02-09 DIAGNOSIS — S81812A Laceration without foreign body, left lower leg, initial encounter: Secondary | ICD-10-CM | POA: Diagnosis not present

## 2024-02-09 MED ORDER — LIDOCAINE HCL (PF) 1 % IJ SOLN
5.0000 mL | Freq: Once | INTRAMUSCULAR | Status: AC
  Administered 2024-02-09: 5 mL
  Filled 2024-02-09: qty 5

## 2024-02-09 NOTE — ED Triage Notes (Signed)
 Patient reports he cut his left anterior lower leg with a chainsaw this afternoon. ~ 2 inches per patient. Bleeding controlled at this time. Last tetanus 2015.

## 2024-02-09 NOTE — ED Notes (Addendum)
 Ace wrap applied over nonstick dressing by medic. Wound cleansed by RN prior to sutures.

## 2024-02-09 NOTE — Discharge Instructions (Signed)
 You were seen in the Emergency Department today for evaluation of your laceration. I am glad we were able to repair this for you. Please make sure that you are remembering to keep your wound clean daily with Dial soap and water and daily bandage changes. I recommend keeping the wound covered for the next 48-72 hours and then to your comfort afterwards. Do not expose the wound to any dishwater, pools, lakes, oceans, [employer], dirt or grime. Keeping the wound clean and away from contamination can help ensure good wound healing and help to prevent infections. You will need to return in 10-14 days for suture removal. This can be down at your primary care office, urgent care, or the ER.  For pain, I recommend Tylenol 1000mg  and/or ibuprofen 600mg  every 6 hours as needed for pain. If you have any concerns, new or worsening symptoms, please return to the nearest ER for re-evaluation.   Contact a doctor if: You got a tetanus shot and you have any of these problems where the needle went in: Swelling. Very bad pain. Redness. Bleeding. A wound that was closed breaks open. You have a fever. You have any of these signs of infection in your wound: More redness, swelling, or pain. Fluid or blood. Warmth. Pus or a bad smell. You see something coming out of the wound, such as wood or glass. Medicine does not make your pain go away. You notice a change in the color of your skin near your wound. You need to change the bandage often. You have a new rash. You lose feeling (have numbness) around the wound. Get help right away if: You have very bad swelling around the wound. Your pain suddenly gets worse and is very bad. You have painful lumps near the wound or on skin anywhere on your body. You have a red streak going away from your wound. The wound is on your hand or foot, and: You cannot move a finger or toe. Your fingers or toes look pale or bluish.

## 2024-02-09 NOTE — ED Provider Notes (Signed)
 [facility] Provider Note   CSN: [id number] Arrival date & time: 02/09/24  1809     History Chief Complaint  Patient presents with   Leg Injury    LEFT    Corey [name] is a 63 y.o. male presents the emerged from today for evaluation of left lower leg injury.  Patient reports that earlier today he was using a chainsaw to cut down some limbs when the chain got caught on his pant leg and cut his left lower leg.  Denies any numbness or tingling.  Denies any blood thinner use.  He is up-to-date on his tetanus which was done in 2019.  Reports small amount of pain.  Allergic to statins.  Former smoker.  HPI     Home Medications Prior to Admission medications   Medication Sig Start Date End Date Taking? Authorizing Provider  acetaminophen (TYLENOL) 650 MG CR tablet Take 650 mg by mouth at bedtime.    [provider]  albuterol (VENTOLIN HFA) 108 (90 Base) MCG/ACT inhaler Inhale 2 puffs into the lungs every 6 (six) hours as needed for wheezing or shortness of breath. 07/28/20   Wendling, Jilda Roche, DO  ALPRAZolam Prudy Feeler) 1 MG tablet Take 1 tablet (1 mg total) by mouth 3 (three) times daily as needed for anxiety. 10/21/23   [name], DO  aspirin EC 81 MG tablet Take 1 tablet (81 mg total) by mouth daily. 06/04/19   Georgeanna Lea, MD  busPIRone (BUSPAR) 5 MG tablet Take 1 tablet (5 mg total) by mouth 2 (two) times daily. 02/05/23   [name], DO  celecoxib (CELEBREX) 200 MG capsule Take 1 capsule by mouth once daily 12/31/23   [name], DO  fluticasone [facility]) 50 MCG/ACT nasal spray Place 2 sprays into both nostrils daily. 09/04/21   Saguier, Ramon Dredge, PA-C  furosemide (LASIX) 20 MG tablet Take 1 tablet by mouth once daily 06/25/23   Georgeanna Lea, MD  isosorbide mononitrate (IMDUR) 30 MG 24 hr tablet Take 1 tablet (30 mg total) by mouth daily. 12/24/23   Georgeanna Lea, MD   losartan-hydrochlorothiazide [facility]) 100-25 MG tablet Take 1 tablet by mouth once daily 12/31/23   Georgeanna Lea, MD  nitroGLYCERIN (NITROSTAT) 0.4 MG SL tablet Place 1 tablet (0.4 mg total) under the tongue every 5 (five) minutes as needed for chest pain. 01/18/21   Mannie Stabile, PA-C  Omega-3 Fatty Acids (OMEGA-3 FISH OIL PO) Take 1 tablet by mouth daily.    [provider]  ranolazine (RANEXA) 1000 MG SR tablet Take 1 tablet by mouth twice daily 02/04/24   Georgeanna Lea, MD  sildenafil (VIAGRA) 50 MG tablet Take 1 tablet (50 mg total) by mouth daily as needed for erectile dysfunction. 09/25/21   Georgeanna Lea, MD      Allergies    Atorvastatin and Rosuvastatin    Review of Systems   Review of Systems  Skin:  Positive for wound.  See HPI  Physical Exam Updated Vital Signs BP 139/85 (BP Location: Right Arm)   Pulse 96   Temp 98.1 F (36.7 C)   Resp 18   Ht 5\' 6"  (1.676 m)   Wt 103.9 kg   SpO2 91%   BMI 36.96 kg/m  Physical Exam Vitals and nursing note reviewed.  Constitutional:      General: He is not in acute distress.    Appearance: He is not ill-appearing  or toxic-appearing.  Eyes:     General: No scleral icterus. Pulmonary:     Effort: Pulmonary effort is normal. No respiratory distress.  Musculoskeletal:        General: Signs of injury present.       Legs:     Comments: Approximately 3 and half centimeter laceration noted to the area above.  Wound actually does not appear very deep.  No adipose tissue exposed.  No foreign body noted.  Patient does not have any numbness or tingling to the area.  Compartments are soft.  Palpable pulses.  Skin:    General: Skin is warm and dry.  Neurological:     Mental Status: He is alert.     ED Results / Procedures / Treatments   Labs (all labs ordered are listed, but only abnormal results are displayed) Labs Reviewed - No data to display  EKG None  Radiology DG Tibia/Fibula Left Result  Date: 02/09/2024 CLINICAL DATA:  Left leg laceration EXAM: LEFT TIBIA AND FIBULA - 2 VIEW COMPARISON:  11/07/2023 FINDINGS: There is no evidence of fracture or other focal bone lesions. No malalignment. Tricompartmental degenerative changes of the left knee. Minimal degenerative changes of the ankle. Soft tissue swelling and irregularity overlying the proximal tibial diaphysis. No radiopaque foreign body. IMPRESSION: Soft tissue swelling and irregularity overlying the proximal tibial diaphysis. No radiopaque foreign body. No acute osseous abnormality. Electronically Signed   By: Duanne Guess D.O.   On: 02/09/2024 18:58    Procedures .Laceration Repair  Date/Time: 02/09/2024 8:39 PM  Performed by: Achille Rich, PA-C Authorized by: Achille Rich, PA-C   Consent:    Consent obtained:  Verbal   Consent given by:  Patient   Risks, benefits, and alternatives were discussed: yes     Risks discussed:  Infection, pain, nerve damage, need for additional repair, retained foreign body, poor cosmetic result and tendon damage   Alternatives discussed:  No treatment Universal protocol:    Procedure explained and questions answered to patient or proxy's satisfaction: yes     Imaging studies available: yes     Patient identity confirmed:  Verbally with patient Anesthesia:    Anesthesia method:  Local infiltration   Local anesthetic:  Lidocaine 1% w/o epi Laceration details:    Location:  Leg   Leg location:  L lower leg   Length (cm):  3.5 Pre-procedure details:    Preparation:  Imaging obtained to evaluate for foreign bodies and patient was prepped and draped in usual sterile fashion Exploration:    Imaging obtained: x-ray     Imaging outcome: foreign body not noted     Wound exploration: wound explored through full range of motion and entire depth of wound visualized     Contaminated: no   Treatment:    Area cleansed with:  Saline and Shur-Clens   Irrigation solution:  Sterile saline Skin  repair:    Repair method:  Sutures   Suture size:  3-0   Suture material:  Prolene   Suture technique:  Simple interrupted   Number of sutures:  5 Approximation:    Approximation:  Close Repair type:    Repair type:  Simple Post-procedure details:    Dressing:  Non-adherent dressing   Procedure completion:  Tolerated well, no immediate complications   Medications Ordered in ED Medications  lidocaine (PF) (XYLOCAINE) 1 % injection 5 mL (has no administration in time range)    ED Course/ Medical Decision Making/ A&P  Medical Decision Making Amount and/or Complexity of Data Reviewed Radiology: ordered.  Risk Prescription drug management.   63 y.o. male presents to the ER for evaluation of laceration. Differential diagnosis includes but is not limited to laceration, abrasion. Vital signs unremarkable.  Oxygenation charted at 91% however I personally performed this in the room and was at 96%. Physical exam as noted above.   X-ray imaging shows  Soft tissue swelling and irregularity overlying the proximal tibial diaphysis. No radiopaque foreign body. No acute osseous abnormality.   Patient's tetanus is up-to-date.  Wound was not contaminated.  I do not think he needs to be sent home on an antibiotic. Was cleansed by nursing staff.  Risk-benefit of procedure discussed.  Patient would like to move forward with procedure.  Please see procedure note.  Patient tolerated procedure well.  Ace bandage and dressing placed over leg.  We discussed wound care as well as when to return to get the sutures removed.  Patient is neuro vastly intact distally.  Compartments are soft.  Likely, wound was not deep.  Closed well with single layer of superficial sutures.  No foreign bodies noted on imaging or on examination.  He is stable for discharge home with close outpatient follow-up.  We discussed the results of the labs/imaging. The plan is wound care, return for suture removal. We discussed strict  return precautions and red flag symptoms. The patient verbalized their understanding and agrees to the plan. The patient is stable and being discharged home in good condition.  Portions of this report may have been transcribed using voice recognition software. Every effort was made to ensure accuracy; however, inadvertent computerized transcription errors may be present.   Final Clinical Impression(s) / ED Diagnoses Final diagnoses:  Laceration of left lower extremity, initial encounter    Rx / DC Orders ED Discharge Orders     None         Achille Rich, PA-C 02/09/24 2050    Glyn Ade, MD 02/09/24 2110

## 2024-02-10 NOTE — Telephone Encounter (Signed)
 Requesting: alprazolam 1mg   Contract: None NWG:NFAO Last Visit: 12/20/23 Next Visit: None Last Refill: 10/21/23 #90 and 0RF   Please Advise

## 2024-02-10 NOTE — Telephone Encounter (Signed)
 Should be seen for CPE vs med ck. I will send in a short supply. Needs UDS and CSC updated as well. Ty.

## 2024-02-10 NOTE — Telephone Encounter (Signed)
 Called patient and no answer left vm to return call

## 2024-02-12 ENCOUNTER — Ambulatory Visit: Payer: BC Managed Care – PPO | Attending: Physician Assistant | Admitting: Physician Assistant

## 2024-02-12 DIAGNOSIS — M170 Bilateral primary osteoarthritis of knee: Secondary | ICD-10-CM

## 2024-02-12 MED ORDER — HYALURONAN 30 MG/2ML IX SOSY
30.0000 mg | PREFILLED_SYRINGE | INTRA_ARTICULAR | Status: AC | PRN
  Administered 2024-02-12: 30 mg via INTRA_ARTICULAR

## 2024-02-12 MED ORDER — LIDOCAINE HCL 1 % IJ SOLN
1.5000 mL | INTRAMUSCULAR | Status: AC | PRN
Start: 2024-02-12 — End: 2024-02-12
  Administered 2024-02-12: 1.5 mL

## 2024-02-12 MED ORDER — HYALURONAN 30 MG/2ML IX SOSY
30.0000 mg | PREFILLED_SYRINGE | INTRA_ARTICULAR | Status: AC | PRN
Start: 2024-02-12 — End: 2024-02-12
  Administered 2024-02-12: 30 mg via INTRA_ARTICULAR

## 2024-02-12 MED ORDER — LIDOCAINE HCL 1 % IJ SOLN
1.5000 mL | INTRAMUSCULAR | Status: AC | PRN
  Administered 2024-02-12: 1.5 mL

## 2024-02-12 NOTE — Progress Notes (Unsigned)
 Work note provided at appointment today.

## 2024-02-12 NOTE — Progress Notes (Signed)
   Procedure Note  Patient: Corey [name]             Date of Birth: 23-Mar-1961           MRN: [id number]             Visit Date: 02/12/2024  Procedures: Visit Diagnoses: No diagnosis found.  Orthovisc #2 bilateral knees, B/B Large Joint Inj: bilateral knee on 02/12/2024 3:57 PM Indications: pain Details: 25 G 1.5 in needle, medial approach  Arthrogram: No  Medications (Right): 1.5 mL lidocaine 1 %; 30 mg Hyaluronan 30 MG/2ML Aspirate (Right): 0 mL Medications (Left): 1.5 mL lidocaine 1 %; 30 mg Hyaluronan 30 MG/2ML Aspirate (Left): 0 mL Outcome: tolerated well, no immediate complications Procedure, treatment alternatives, risks and benefits explained, specific risks discussed. Consent was given by the patient. Immediately prior to procedure a time out was called to verify the correct patient, procedure, equipment, support staff and site/side marked as required.     Patient was evaluated on 02/09/2024 for a laceration on his left lower extremity.  About a 3.5 inch laceration due to chainsaw injury.  Up-to-date with tetanus vaccine.  Wound was not contaminated.  Was not sent home with antibiotics due to no concern for healing or infection.  Wound care was discussed.  The wound appears to be healing.  No signs of surrounding erythema or cellulitis. No drainage or signs of infection. Wound does not cross the joint line and is not directly near the injection site.  Patient requested to proceed with the Orthovisc injections today. Discussed possibly risks and need for proper wound care.  Patient tolerated the procedures well.  Procedure notes were completed above.  Aftercare was discussed. Sherron Ales, PA-C

## 2024-02-19 ENCOUNTER — Encounter: Payer: Self-pay | Admitting: *Deleted

## 2024-02-19 ENCOUNTER — Ambulatory Visit: Payer: BC Managed Care – PPO | Attending: Physician Assistant | Admitting: Physician Assistant

## 2024-02-19 DIAGNOSIS — M170 Bilateral primary osteoarthritis of knee: Secondary | ICD-10-CM | POA: Diagnosis not present

## 2024-02-19 MED ORDER — HYALURONAN 30 MG/2ML IX SOSY
30.0000 mg | PREFILLED_SYRINGE | INTRA_ARTICULAR | Status: AC | PRN
  Administered 2024-02-19: 30 mg via INTRA_ARTICULAR

## 2024-02-19 MED ORDER — LIDOCAINE HCL 1 % IJ SOLN
1.5000 mL | INTRAMUSCULAR | Status: AC | PRN
  Administered 2024-02-19: 1.5 mL

## 2024-02-19 NOTE — Progress Notes (Signed)
   Procedure Note  Patient: Corey [name]             Date of Birth: 1961/01/03           MRN: [id number]             Visit Date: 02/19/2024  Procedures: Visit Diagnoses:  1. Primary osteoarthritis of both knees     Orthovisc #3 bilateral knees, B/B Large Joint Inj: bilateral knee on 02/19/2024 2:57 PM Indications: pain Details: 25 G 1.5 in needle, medial approach  Arthrogram: No  Medications (Right): 1.5 mL lidocaine 1 %; 30 mg Hyaluronan 30 MG/2ML Aspirate (Right): 0 mL Medications (Left): 1.5 mL lidocaine 1 %; 30 mg Hyaluronan 30 MG/2ML Aspirate (Left): 0 mL Outcome: tolerated well, no immediate complications Procedure, treatment alternatives, risks and benefits explained, specific risks discussed. Consent was given by the patient. Immediately prior to procedure a time out was called to verify the correct patient, procedure, equipment, support staff and site/side marked as required.    Dr. Corliss Skains and I examined the laceration on his left lower leg-appears to be healing-granulation tissue noted.  Sutures remain in place.  He will be going to have sutures removed soon.   Patient tolerated the procedures well.  Aftercare was discussed.  Work note was provided to the patient as requested.  Sherron Ales, PA-C

## 2024-03-02 ENCOUNTER — Encounter: Payer: Self-pay | Admitting: Family Medicine

## 2024-03-02 ENCOUNTER — Ambulatory Visit: Admitting: Family Medicine

## 2024-03-02 VITALS — BP 130/80 | HR 80 | Temp 97.9°F | Ht 66.0 in | Wt 230.0 lb

## 2024-03-02 DIAGNOSIS — S81802A Unspecified open wound, left lower leg, initial encounter: Secondary | ICD-10-CM | POA: Diagnosis not present

## 2024-03-02 DIAGNOSIS — F411 Generalized anxiety disorder: Secondary | ICD-10-CM

## 2024-03-02 DIAGNOSIS — Z1159 Encounter for screening for other viral diseases: Secondary | ICD-10-CM | POA: Diagnosis not present

## 2024-03-02 DIAGNOSIS — Z114 Encounter for screening for human immunodeficiency virus [HIV]: Secondary | ICD-10-CM | POA: Diagnosis not present

## 2024-03-02 DIAGNOSIS — M25512 Pain in left shoulder: Secondary | ICD-10-CM | POA: Diagnosis not present

## 2024-03-02 DIAGNOSIS — I10 Essential (primary) hypertension: Secondary | ICD-10-CM

## 2024-03-02 DIAGNOSIS — G8929 Other chronic pain: Secondary | ICD-10-CM

## 2024-03-02 MED ORDER — PREDNISONE 20 MG PO TABS: 40.0000 mg | ORAL_TABLET | Freq: Every day | ORAL | 0 refills | Status: AC

## 2024-03-02 MED ORDER — ALPRAZOLAM 1 MG PO TABS: 1.0000 mg | ORAL_TABLET | Freq: Three times a day (TID) | ORAL | 5 refills | Status: DC | PRN

## 2024-03-02 NOTE — Progress Notes (Signed)
 Chief Complaint  Patient presents with   Acute Visit    Patient presents today for lower left leg check. He think it maybe infected.   Quality Metric Gaps    Pneumococcal, zoster, HIV & Hep C screening    Corey [name] is a 63 y.o. male here for a skin complaint.  Duration:  15  days Location: LLE Pruritic? No Painful? No Drainage? No Hit area w a chainsaw.  Other associated symptoms: none Therapies tried thus far: TAO  Chronic L shoulder pain. He was loading a trailer over the weekend and it flared up.  Pain is in the front of the left shoulder.  Decreased range of motion due to the pain.  No neurologic signs or symptoms, bruising, redness, or swelling.  He has tried Tylenol without significant relief.  Past Medical History:  Diagnosis Date   Angina pectoris (HCC) 06/04/2019   Arthritis    Benign essential HTN 02/10/2016   Bronchospasm with bronchitis, acute 12/14/2013   Coronary artery disease status post PTCA and drug-eluting stent to circumflex artery in July 2020 06/26/2019   Dyslipidemia 09/24/2020   GAD (generalized anxiety disorder) 12/14/2013   GERD (gastroesophageal reflux disease)    occ   History of kidney stones    Hypertension 12/14/2013   Hypogonadism male 12/14/2013   Kidney stones 02/10/2016   Myelopathy (HCC) 06/03/2014   Obesity    OSA (obstructive sleep apnea) 02/10/2016   Shortness of breath    occ-allergies   Sleep apnea    cpap 16 yrs    BP 130/80   Pulse 80   Temp 97.9 F (36.6 C)   Ht 5\' 6"  (1.676 m)   Wt 230 lb (104.3 kg)   SpO2 98%   BMI 37.12 kg/m  Gen: awake, alert, appearing stated age Lungs: No accessory muscle use MSK: TTP over the anterior deltoid and left trapezius region.  No asymmetry, edema, deformity.  Decreased active and passive forward flexion.  Negative Neer's, Hawkins, crossover, empty can, speeds equivocal Neuro: Grip strength adequate, DTRs equal and symmetric in the upper extremities without clonus Skin: Laceration with a  circular excoriated area with granulation tissue. No drainage, erythema, TTP, fluctuance, excessive warmth Psych: Age appropriate judgment and insight  Wound of left lower extremity, initial encounter  GAD (generalized anxiety disorder) - Plan: ALPRAZolam (XANAX) 1 MG tablet, Drug Monitoring Panel [phone] , Urine  Chronic left shoulder pain - Plan: predniSONE (DELTASONE) 20 MG tablet  Benign essential HTN - Plan: Comprehensive metabolic panel with GFR, Lipid panel  Encounter for hepatitis C screening test for low risk patient - Plan: Hepatitis C antibody  Screening for HIV without presence of risk factors - Plan: HIV Antibody (routine testing w rflx)  Reassurance.  Triple antimicrobial twice daily for the next week. Chronic, stable.  Continue Xanax as above.  Will look to decrease when he is retired.  UDS and CSC updated today. Chronic, not controlled.  I suspect a combination of a deltoid strain with trapezius involvement.  5-day prednisone burst 40 mg daily.  Stretches and exercises.  Heat, ice, Tylenol.  Would consider referral if no improvement in the next month. Check labs. 5/6.  Screen as above. The patient voiced understanding and agreement to the plan.  Jilda Roche [place], DO 03/02/24 4:48 PM

## 2024-03-02 NOTE — Patient Instructions (Addendum)
 Heat (pad or rice pillow in microwave) over affected area, 10-15 minutes twice daily.   Ice/cold pack over area for 10-15 min twice daily.  OK to take Tylenol 1000 mg (2 extra strength tabs) or 975 mg (3 regular strength tabs) every 6 hours as needed.  Do not shower for the rest of the day. When you do wash it, use only soap and water. Do not vigorously scrub. Apply triple antibiotic ointment (like Neosporin) twice daily. Keep the area clean and dry.   Things to look out for: increasing pain not relieved by ibuprofen/acetaminophen, fevers, spreading redness, drainage of pus, or foul odor.  Let us know if you need anything.  Trapezius stretches/exercises Do exercises exactly as told by your health care provider and adjust them as directed. It is normal to feel mild stretching, pulling, tightness, or discomfort as you do these exercises, but you should stop right away if you feel sudden pain or your pain gets worse.   Stretching and range of motion exercises These exercises warm up your muscles and joints and improve the movement and flexibility of your shoulder. These exercises can also help to relieve pain, numbness, and tingling. If you are unable to do any of the following for any reason, do not further attempt to do it.   Exercise A: Flexion, standing     Stand and hold a broomstick, a cane, or a similar object. Place your hands a little more than shoulder-width apart on the object. Your left / right hand should be palm-up, and your other hand should be palm-down. Push the stick to raise your left / right arm out to your side and then over your head. Use your other hand to help move the stick. Stop when you feel a stretch in your shoulder, or when you reach the angle that is recommended by your health care provider. Avoid shrugging your shoulder while you raise your arm. Keep your shoulder blade tucked down toward your spine. Hold for 30 seconds. Slowly return to the starting  position. Repeat 2 times. Complete this exercise 3 times per week.  Exercise B: Abduction, supine     Lie on your back and hold a broomstick, a cane, or a similar object. Place your hands a little more than shoulder-width apart on the object. Your left / right hand should be palm-up, and your other hand should be palm-down. Push the stick to raise your left / right arm out to your side and then over your head. Use your other hand to help move the stick. Stop when you feel a stretch in your shoulder, or when you reach the angle that is recommended by your health care provider. Avoid shrugging your shoulder while you raise your arm. Keep your shoulder blade tucked down toward your spine. Hold for 30 seconds. Slowly return to the starting position. Repeat 2 times. Complete this exercise 3 times per week.  Exercise C: Flexion, active-assisted     Lie on your back. You may bend your knees for comfort. Hold a broomstick, a cane, or a similar object. Place your hands about shoulder-width apart on the object. Your palms should face toward your feet. Raise the stick and move your arms over your head and behind your head, toward the floor. Use your healthy arm to help your left / right arm move farther. Stop when you feel a gentle stretch in your shoulder, or when you reach the angle where your health care provider tells you to stop. Hold  for 30 seconds. Slowly return to the starting position. Repeat 2 times. Complete this exercise 3 times per week.  Exercise D: External rotation and abduction     Stand in a door frame with one of your feet slightly in front of the other. This is called a staggered stance. Choose one of the following positions as told by your health care provider: Place your hands and forearms on the door frame above your head. Place your hands and forearms on the door frame at the height of your head. Place your hands on the door frame at the height of your elbows. Slowly move  your weight onto your front foot until you feel a stretch across your chest and in the front of your shoulders. Keep your head and chest upright and keep your abdominal muscles tight. Hold for 30 seconds. To release the stretch, shift your weight to your back foot. Repeat 2 times. Complete this stretch 3 times per week.  Strengthening exercises These exercises build strength and endurance in your shoulder. Endurance is the ability to use your muscles for a long time, even after your muscles get tired. Exercise E: Scapular depression and adduction  Sit on a stable chair. Support your arms in front of you with pillows, armrests, or a tabletop. Keep your elbows in line with the sides of your body. Gently move your shoulder blades down toward your middle back. Relax the muscles on the tops of your shoulders and in the back of your neck. Hold for 3 seconds. Slowly release the tension and relax your muscles completely before doing this exercise again. Repeat for a total of 10 repetitions. After you have practiced this exercise, try doing the exercise without the arm support. Then, try the exercise while standing instead of sitting. Repeat 2 times. Complete this exercise 3 times per week.  Exercise F: Shoulder abduction, isometric     Stand or sit about 4-6 inches (10-15 cm) from a wall with your left / right side facing the wall. Bend your left / right elbow and gently press your elbow against the wall. Increase the pressure slowly until you are pressing as hard as you can without shrugging your shoulder. Hold for 3 seconds. Slowly release the tension and relax your muscles completely. Repeat for a total of 10 repetitions. Repeat 2 times. Complete this exercise 3 times per week.  Exercise G: Shoulder flexion, isometric     Stand or sit about 4-6 inches (10-15 cm) away from a wall with your left / right side facing the wall. Keep your left / right elbow straight and gently press the top of your  fist against the wall. Increase the pressure slowly until you are pressing as hard as you can without shrugging your shoulder. Hold for 10-15 seconds. Slowly release the tension and relax your muscles completely. Repeat for a total of 10 repetitions. Repeat 2 times. Complete this exercise 3 times per week.  Exercise H: Internal rotation     Sit in a stable chair without armrests, or stand. Secure an exercise band at your left / right side, at elbow height. Place a soft object, such as a folded towel or a small pillow, under your left / right upper arm so your elbow is a few inches (about 8 cm) away from your side. Hold the end of the exercise band so the band stretches. Keeping your elbow pressed against the soft object under your arm, move your forearm across your body toward your abdomen.  Keep your body steady so the movement is only coming from your shoulder. Hold for 3 seconds. Slowly return to the starting position. Repeat for a total of 10 repetitions. Repeat 2 times. Complete this exercise 3 times per week.  Exercise I: External rotation     Sit in a stable chair without armrests, or stand. Secure an exercise band at your left / right side, at elbow height. Place a soft object, such as a folded towel or a small pillow, under your left / right upper arm so your elbow is a few inches (about 8 cm) away from your side. Hold the end of the exercise band so the band stretches. Keeping your elbow pressed against the soft object under your arm, move your forearm out, away from your abdomen. Keep your body steady so the movement is only coming from your shoulder. Hold for 3 seconds. Slowly return to the starting position. Repeat for a total of 10 repetitions. Repeat 2 times. Complete this exercise 3 times per week. Exercise J: Shoulder extension  Sit in a stable chair without armrests, or stand. Secure an exercise band to a stable object in front of you so the band is at shoulder  height. Hold one end of the exercise band in each hand. Your palms should face each other. Straighten your elbows and lift your hands up to shoulder height. Step back, away from the secured end of the exercise band, until the band stretches. Squeeze your shoulder blades together and pull your hands down to the sides of your thighs. Stop when your hands are straight down by your sides. Do not let your hands go behind your body. Hold for 3 seconds. Slowly return to the starting position. Repeat for a total of 10 repetitions. Repeat 2 times. Complete this exercise 3 times per week.  Exercise K: Shoulder extension, prone     Lie on your abdomen on a firm surface so your left / right arm hangs over the edge. Hold a 5 lb weight in your hand so your palm faces in toward your body. Your arm should be straight. Squeeze your shoulder blade down toward the middle of your back. Slowly raise your arm behind you, up to the height of the surface that you are lying on. Keep your arm straight. Hold for 3 seconds. Slowly return to the starting position and relax your muscles. Repeat for a total of 10 repetitions. Repeat 2 times. Complete this exercise 3 times per week.   Exercise L: Horizontal abduction, prone  Lie on your abdomen on a firm surface so your left / right arm hangs over the edge. Hold a 5 lb weight in your hand so your palm faces toward your feet. Your arm should be straight. Squeeze your shoulder blade down toward the middle of your back. Bend your elbow so your hand moves up, until your elbow is bent to an "L" shape (90 degrees). With your elbow bent, slowly move your forearm forward and up. Raise your hand up to the height of the surface that you are lying on. Your upper arm should not move, and your elbow should stay bent. At the top of the movement, your palm should face the floor. Hold for 3 seconds. Slowly return to the starting position and relax your muscles. Repeat for a total of 10  repetitions. Repeat 2 times. Complete this exercise 3 times per week.  Exercise M: Horizontal abduction, standing  Sit on a stable chair, or stand. Secure an  exercise band to a stable object in front of you so the band is at shoulder height. Hold one end of the exercise band in each hand. Straighten your elbows and lift your hands straight in front of you, up to shoulder height. Your palms should face down, toward the floor. Step back, away from the secured end of the exercise band, until the band stretches. Move your arms out to your sides, and keep your arms straight. Hold for 3 seconds. Slowly return to the starting position. Repeat for a total of 10 repetitions. Repeat 2 times. Complete this exercise 3 times per week.  Exercise N: Scapular retraction and elevation  Sit on a stable chair, or stand. Secure an exercise band to a stable object in front of you so the band is at shoulder height. Hold one end of the exercise band in each hand. Your palms should face each other. Sit in a stable chair without armrests, or stand. Step back, away from the secured end of the exercise band, until the band stretches. Squeeze your shoulder blades together and lift your hands over your head. Keep your elbows straight. Hold for 3 seconds. Slowly return to the starting position. Repeat for a total of 10 repetitions. Repeat 2 times. Complete this exercise 3 times per week.  This information is not intended to replace advice given to you by your health care provider. Make sure you discuss any questions you have with your health care provider. Document Released: 11/12/2005 Document Revised: 07/19/2016 Document Reviewed: 09/29/2015 Elsevier Interactive Patient Education  2017 [employer].

## 2024-03-03 ENCOUNTER — Encounter: Payer: Self-pay | Admitting: Family Medicine

## 2024-03-03 LAB — LIPID PANEL
Cholesterol: 165 mg/dL (ref 0–200)
HDL: 41.8 mg/dL (ref >39.00)
LDL Cholesterol: 88 mg/dL (ref 0–99)
NonHDL: 123
Total CHOL/HDL Ratio: 4
Triglycerides: 177 mg/dL — ABNORMAL HIGH (ref 0.0–149.0)
VLDL: 35.4 mg/dL (ref 0.0–40.0)

## 2024-03-03 LAB — HEPATITIS C ANTIBODY: Hepatitis C Ab: NONREACTIVE

## 2024-03-03 LAB — COMPREHENSIVE METABOLIC PANEL WITH GFR
ALT: 10 U/L (ref 0–53)
AST: 13 U/L (ref 0–37)
Albumin: 4.3 g/dL (ref 3.5–5.2)
Alkaline Phosphatase: 61 U/L (ref 39–117)
BUN: 18 mg/dL (ref 6–23)
CO2: 26 meq/L (ref 19–32)
Calcium: 9.3 mg/dL (ref 8.4–10.5)
Chloride: 102 meq/L (ref 96–112)
Creatinine, Ser: 1.13 mg/dL (ref 0.40–1.50)
GFR: 69.78 mL/min (ref >60.00)
Glucose, Bld: 104 mg/dL — ABNORMAL HIGH (ref 70–99)
Potassium: 4 meq/L (ref 3.5–5.1)
Sodium: 138 meq/L (ref 135–145)
Total Bilirubin: 0.4 mg/dL (ref 0.2–1.2)
Total Protein: 6.8 g/dL (ref 6.0–8.3)

## 2024-03-03 LAB — HIV ANTIBODY (ROUTINE TESTING W REFLEX): HIV 1&2 Ab, 4th Generation: NONREACTIVE

## 2024-03-04 LAB — DRUG MONITORING PANEL 376104, URINE

## 2024-03-04 LAB — DM TEMPLATE

## 2024-03-30 ENCOUNTER — Telehealth: Payer: Self-pay | Admitting: Rheumatology

## 2024-03-30 NOTE — Telephone Encounter (Signed)
 Pts daughter called on behalf of pt. Pts daughter stated his knees have been in extreme pain and that the gel injections did not help his pain at all. Pt would like to try something different to help his pain. Pts daughter would like a call at [phone] due to pt not being able to talk on the phone.

## 2024-03-30 NOTE — Telephone Encounter (Signed)
 Returned call from patient's daughter.  She stated that her father has been applying topical diclofenac gel and taking Tylenol  without much pain relief.  He had no response to viscosupplement injections.  I reviewed the x-rays and he has severe osteoarthritis involving bilateral knee joints.  I offered bilateral knee joint cortisone injections.  Patient would like to schedule appointment.  Please schedule an appointment for cortisone injections to bilateral knee joints.

## 2024-03-30 NOTE — Telephone Encounter (Signed)
**Note De-identified  [place] Obfuscation** Please advise 

## 2024-03-31 ENCOUNTER — Ambulatory Visit: Attending: Rheumatology | Admitting: Rheumatology

## 2024-03-31 ENCOUNTER — Encounter: Payer: Self-pay | Admitting: Rheumatology

## 2024-03-31 VITALS — BP 119/72 | HR 53 | Resp 16 | Ht 65.0 in | Wt 229.8 lb

## 2024-03-31 DIAGNOSIS — M19041 Primary osteoarthritis, right hand: Secondary | ICD-10-CM | POA: Diagnosis not present

## 2024-03-31 DIAGNOSIS — M2141 Flat foot [pes planus] (acquired), right foot: Secondary | ICD-10-CM

## 2024-03-31 DIAGNOSIS — M170 Bilateral primary osteoarthritis of knee: Secondary | ICD-10-CM

## 2024-03-31 DIAGNOSIS — F411 Generalized anxiety disorder: Secondary | ICD-10-CM

## 2024-03-31 DIAGNOSIS — S68113A Complete traumatic metacarpophalangeal amputation of left middle finger, initial encounter: Secondary | ICD-10-CM

## 2024-03-31 DIAGNOSIS — N200 Calculus of kidney: Secondary | ICD-10-CM

## 2024-03-31 DIAGNOSIS — M19042 Primary osteoarthritis, left hand: Secondary | ICD-10-CM

## 2024-03-31 DIAGNOSIS — M47816 Spondylosis without myelopathy or radiculopathy, lumbar region: Secondary | ICD-10-CM

## 2024-03-31 DIAGNOSIS — M5030 Other cervical disc degeneration, unspecified cervical region: Secondary | ICD-10-CM

## 2024-03-31 DIAGNOSIS — E291 Testicular hypofunction: Secondary | ICD-10-CM

## 2024-03-31 DIAGNOSIS — M2142 Flat foot [pes planus] (acquired), left foot: Secondary | ICD-10-CM

## 2024-03-31 DIAGNOSIS — Z6836 Body mass index (BMI) 36.0-36.9, adult: Secondary | ICD-10-CM

## 2024-03-31 DIAGNOSIS — M25511 Pain in right shoulder: Secondary | ICD-10-CM | POA: Diagnosis not present

## 2024-03-31 DIAGNOSIS — R0602 Shortness of breath: Secondary | ICD-10-CM

## 2024-03-31 DIAGNOSIS — G8929 Other chronic pain: Secondary | ICD-10-CM

## 2024-03-31 DIAGNOSIS — E785 Hyperlipidemia, unspecified: Secondary | ICD-10-CM

## 2024-03-31 DIAGNOSIS — M2550 Pain in unspecified joint: Secondary | ICD-10-CM

## 2024-03-31 DIAGNOSIS — E66812 Obesity, class 2: Secondary | ICD-10-CM

## 2024-03-31 DIAGNOSIS — I2510 Atherosclerotic heart disease of native coronary artery without angina pectoris: Secondary | ICD-10-CM

## 2024-03-31 DIAGNOSIS — I10 Essential (primary) hypertension: Secondary | ICD-10-CM

## 2024-03-31 DIAGNOSIS — M25512 Pain in left shoulder: Secondary | ICD-10-CM

## 2024-03-31 DIAGNOSIS — G4733 Obstructive sleep apnea (adult) (pediatric): Secondary | ICD-10-CM

## 2024-03-31 MED ORDER — LIDOCAINE HCL 1 % IJ SOLN
1.5000 mL | INTRAMUSCULAR | Status: AC | PRN
  Administered 2024-03-31: 1.5 mL

## 2024-03-31 MED ORDER — TRIAMCINOLONE ACETONIDE 40 MG/ML IJ SUSP
40.0000 mg | INTRAMUSCULAR | Status: AC | PRN
Start: 2024-03-31 — End: 2024-03-31
  Administered 2024-03-31: 40 mg via INTRA_ARTICULAR

## 2024-03-31 MED ORDER — LIDOCAINE HCL 1 % IJ SOLN
1.5000 mL | INTRAMUSCULAR | Status: AC | PRN
Start: 2024-03-31 — End: 2024-03-31
  Administered 2024-03-31: 1.5 mL

## 2024-03-31 NOTE — Telephone Encounter (Signed)
 Patient scheduled for appointment for knee injections on 03/31/2024 at 3:00 pm.

## 2024-03-31 NOTE — Patient Instructions (Signed)
 Exercises for Chronic Knee Pain  Chronic knee pain is pain that lasts longer than 3 months. For most people with chronic knee pain, exercise and weight loss is an important part of treatment. Your health care provider may want you to focus on:  Making the muscles that support your knee stronger. This can take pressure off your knee and reduce pain.  Preventing knee stiffness.  How far you can move your knee, keeping it there or making it farther.  Losing weight (if this applies) to take pressure off your knee, lower your risk for injury, and make it easier for you to exercise.  Your provider will help you make an exercise program that fits your needs and physical abilities. Below are simple, low-impact exercises you can do at home. Ask your provider or physical therapist how often you should do your exercise program and how many times to repeat each exercise.  General safety tips    Get your provider's approval before doing any exercises.  Start slowly and stop any time you feel pain.  Do not exercise if your knee pain is flaring up.  Warm up first. Stretching a cold muscle can cause an injury. Do 5-10 minutes of easy movement or light stretching before beginning your exercises.  Do 5-10 minutes of low-impact activity (like walking or cycling) before starting strengthening exercises.  Contact your provider any time you have pain during or after exercising. Exercise can cause discomfort but should not be painful. It is normal to be a little stiff or sore after exercising.  Stretching and range-of-motion exercises  Front thigh stretch    Stand up straight and support your body by holding on to a chair or resting one hand on a Mastandrea.  With your legs straight and close together, bend one knee to lift your heel up toward your butt.  Using one hand for support, grab your ankle with your free hand.  Pull your foot up closer toward your butt to feel the stretch in front of your thigh.  Hold the stretch for 30  seconds.  Repeat __________ times. Complete this exercise __________ times a day.  Back thigh stretch    Sit on the floor with your back straight and your legs out straight in front of you.  Place the palms of your hands on the floor and slide them toward your feet as you bend at the hip.  Try to touch your nose to your knees and feel the stretch in the back of your thighs.  Hold for 30 seconds.  Repeat __________ times. Complete this exercise __________ times a day.  Calf stretch    Stand facing a Sheehy.  Place the palms of your hands flat against the Ghazi, arms extended, and lean slightly against the Muha.  Get into a lunge position with one leg bent at the knee and the other leg stretched out straight behind you.  Keep both feet facing the Ziesmer and increase the bend in your knee while keeping the heel of the other leg flat on the ground.  You should feel the stretch in your calf. Hold for 30 seconds.  Repeat __________ times. Complete this exercise __________ times a day.  Strengthening exercises  Straight leg lift    Lie on your back with one knee bent and the other leg out straight.  Slowly lift the straight leg without bending the knee.  Lift until your foot is about 12 inches (30 cm) off the floor.  Hold for  3-5 seconds and slowly lower your leg.  Repeat __________ times. Complete this exercise __________ times a day.  Single leg dip    Stand between two chairs and put both hands on the backs of the chairs for support.  Extend one leg out straight with your body weight resting on the heel of the standing leg.  Slowly bend your standing knee to dip your body to the level that is comfortable for you.  Hold for 3-5 seconds.  Repeat __________ times. Complete this exercise __________ times a day.  Hamstring curls    Stand straight, knees close together, facing the back of a chair.  Hold on to the back of a chair with both hands.  Keep one leg straight. Bend the other knee while bringing the heel up toward the butt  until the knee is bent at a 90-degree angle (right angle).  Hold for 3-5 seconds.  Repeat __________ times. Complete this exercise __________ times a day.  Neece squat    Stand straight with your back, hips, and head against a Rushing.  Step forward one foot at a time with your back still against the Spargur.  Your feet should be 2 feet (61 cm) from the Jeanpaul at shoulder width. Keeping your back, hips, and head against the Colligan, slide down the Brenner to as close to a sitting position as you can get.  Hold for 5-10 seconds, then slowly slide back up.  Repeat __________ times. Complete this exercise __________ times a day.  Step-ups    Stand in front of a sturdy platform or stool that is about 6 inches (15 cm) high.  Slowly step up with your left / right foot, keeping your knee in line with your hip and foot. Do not let your knee bend so far that you cannot see your toes. Hold on to a chair for balance, but do not use it for support.  Slowly unlock your knee and lower yourself to the starting position.  Repeat __________ times. Complete this exercise __________ times a day.  Contact a health care provider if:  Your exercises cause pain.  Your pain is worse after you exercise.  Your pain prevents you from doing your exercises.  This information is not intended to replace advice given to you by your health care provider. Make sure you discuss any questions you have with your health care provider.  Document Revised: 11/27/2022 Document Reviewed: 11/27/2022  Elsevier Patient Education  2024 [employer].
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
## 2024-04-01 ENCOUNTER — Other Ambulatory Visit: Payer: Self-pay | Admitting: Family Medicine

## 2024-05-23 ENCOUNTER — Other Ambulatory Visit: Payer: Self-pay | Admitting: Cardiology

## 2024-05-23 DIAGNOSIS — I2510 Atherosclerotic heart disease of native coronary artery without angina pectoris: Secondary | ICD-10-CM

## 2024-05-25 NOTE — Telephone Encounter (Signed)
 Rx refill sent to pharmacy.

## 2024-06-25 ENCOUNTER — Other Ambulatory Visit: Payer: Self-pay | Admitting: Family Medicine

## 2024-06-25 ENCOUNTER — Ambulatory Visit: Payer: BC Managed Care – PPO | Admitting: Rheumatology

## 2024-07-16 ENCOUNTER — Ambulatory Visit: Admitting: Physician Assistant

## 2024-07-16 ENCOUNTER — Encounter: Payer: Self-pay | Admitting: Physician Assistant

## 2024-07-16 VITALS — BP 110/64 | HR 58 | Ht 65.0 in | Wt 215.0 lb

## 2024-07-16 DIAGNOSIS — L03113 Cellulitis of right upper limb: Secondary | ICD-10-CM | POA: Diagnosis not present

## 2024-07-16 MED ORDER — DOXYCYCLINE HYCLATE 100 MG PO TABS: 100.0000 mg | ORAL_TABLET | Freq: Two times a day (BID) | ORAL | 0 refills | Status: AC

## 2024-07-16 NOTE — Progress Notes (Signed)
 Established patient visit   Patient: Corey [name]   DOB: 1961/02/09   63 y.o. Male  MRN: [id number] Visit Date: 07/16/2024  Today's healthcare provider: [name] Flatness, PA-C   Cc. Painful lump on arm  Subjective     Pt reports three spots on his right arm that became inflamed, enlarged and painful. The first one appeared a few weeks after mowing the lawn. The newest just showed up yesterday and worsened this morning. Unsure if they are bites/injuries.  He is often on his hands/knees working and cuts his arms frequently.      Medications: Outpatient Medications Prior to Visit  Medication Sig   acetaminophen  (TYLENOL ) 650 MG CR tablet Take 2,600 mg by mouth every 4 (four) hours.   albuterol  (VENTOLIN  HFA) 108 (90 Base) MCG/ACT inhaler Inhale 2 puffs into the lungs every 6 (six) hours as needed for wheezing or shortness of breath.   ALPRAZolam  (XANAX ) 1 MG tablet Take 1 tablet (1 mg total) by mouth 3 (three) times daily as needed. for anxiety   Ascorbic Acid (VITAMIN C PO) Take by mouth.   aspirin  EC 81 MG tablet Take 1 tablet (81 mg total) by mouth daily.   celecoxib  (CELEBREX ) 200 MG capsule Take 1 capsule by mouth once daily   Cyanocobalamin  (VITAMIN B-12 PO) Take by mouth.   fluticasone  (FLONASE ) 50 MCG/ACT nasal spray Place 2 sprays into both nostrils daily.   furosemide  (LASIX ) 20 MG tablet Take 1 tablet by mouth once daily   isosorbide  mononitrate (IMDUR ) 30 MG 24 hr tablet Take 1 tablet (30 mg total) by mouth daily. (Patient not taking: Reported on 07/16/2024)   losartan -hydrochlorothiazide (HYZAAR) 100-25 MG tablet Take 1 tablet by mouth once daily   MAGNESIUM PO Take by mouth.   nitroGLYCERIN  (NITROSTAT ) 0.4 MG SL tablet Place 1 tablet (0.4 mg total) under the tongue every 5 (five) minutes as needed for chest pain.   Omega-3 Fatty Acids (OMEGA-3 [name] PO) Take 1 tablet by mouth daily.   ranolazine  (RANEXA ) 1000 MG SR tablet Take 1 tablet by mouth twice daily    sildenafil  (VIAGRA ) 50 MG tablet Take 1 tablet (50 mg total) by mouth daily as needed for erectile dysfunction. (Patient not taking: Reported on 03/31/2024)   VITAMIN D PO Take by mouth.   [DISCONTINUED] busPIRone  (BUSPAR ) 5 MG tablet Take 1 tablet (5 mg total) by mouth 2 (two) times daily. (Patient not taking: Reported on 03/31/2024)   No facility-administered medications prior to visit.    Review of Systems  Constitutional:  Negative for fatigue and fever.  Respiratory:  Negative for cough and shortness of breath.   Cardiovascular:  Negative for chest pain, palpitations and leg swelling.  Skin:  Positive for wound.  Neurological:  Negative for dizziness and headaches.       Objective    BP 110/64   Pulse (!) 58   Ht 5' 5 (1.651 m)   Wt 215 lb (97.5 kg)   BMI 35.78 kg/m    Physical Exam Vitals reviewed.  Constitutional:      Appearance: He is not ill-appearing.  HENT:     Head: Normocephalic.  Eyes:     Conjunctiva/sclera: Conjunctivae normal.  Cardiovascular:     Rate and Rhythm: Normal rate.  Pulmonary:     Effort: Pulmonary effort is normal. No respiratory distress.  Skin:    Comments: Right arm with two scabbed lesions, above this right forearm with a 2-3 cm  indurated erythematous tender lesion.   Neurological:     Mental Status: He is alert and oriented to person, place, and time.  Psychiatric:        Mood and Affect: Mood normal.        Behavior: Behavior normal.     No results found for any visits on 07/16/24.  Assessment & Plan    Cellulitis of right upper extremity -     Doxycycline  Hyclate; Take 1 tablet (100 mg total) by mouth 2 (two) times daily for 7 days.  Dispense: 14 tablet; Refill: 0  Rx doxy bid x  7days . If no improvement/worsening please call the office.  Return if symptoms worsen or fail to improve.      [name] Flatness, PA-C  [facility] [phone] (phone) [phone] (fax)  [facility]

## 2024-07-21 NOTE — Progress Notes (Deleted)
 Office Visit Note  Patient: Corey [name]             Date of Birth: 11-23-1961           MRN: [id number]             PCP: [name], DO Referring: [name]* Visit Date: 08/04/2024 Occupation: @GUAROCC @  Subjective:    History of Present Illness: Corey [name] is a 63 y.o. male with history of osteoarthritis.    Celebrex   Visco in March 2025   Activities of Daily Living:  Patient reports morning stiffness for *** {minute/hour:19697}.   Patient {ACTIONS;DENIES/REPORTS:21021675::Denies} nocturnal pain.  Difficulty dressing/grooming: {ACTIONS;DENIES/REPORTS:21021675::Denies} Difficulty climbing stairs: {ACTIONS;DENIES/REPORTS:21021675::Denies} Difficulty getting out of chair: {ACTIONS;DENIES/REPORTS:21021675::Denies} Difficulty using hands for taps, buttons, cutlery, and/or writing: {ACTIONS;DENIES/REPORTS:21021675::Denies}  No Rheumatology ROS completed.   PMFS History:  Patient Active Problem List   Diagnosis Date Noted   Dyslipidemia 09/24/2020   Sleep apnea    Shortness of breath    Obesity    History of kidney stones    Arthritis    Coronary artery disease status post PTCA and drug-eluting stent to circumflex artery in July 2020 06/26/2019   Angina pectoris (HCC) 06/04/2019   Benign essential HTN 02/10/2016   OSA (obstructive sleep apnea) 02/10/2016   GERD (gastroesophageal reflux disease) 02/10/2016   Kidney stones 02/10/2016   Myelopathy (HCC) 06/03/2014   Bronchospasm with bronchitis, acute 12/14/2013   GAD (generalized anxiety disorder) 12/14/2013   Hypertension 12/14/2013   Hypogonadism male 12/14/2013    Past Medical History:  Diagnosis Date   Angina pectoris (HCC) 06/04/2019   Arthritis    Benign essential HTN 02/10/2016   Bronchospasm with bronchitis, acute 12/14/2013   Coronary artery disease status post PTCA and drug-eluting stent to circumflex artery in July 2020 06/26/2019   Dyslipidemia 09/24/2020   GAD  (generalized anxiety disorder) 12/14/2013   GERD (gastroesophageal reflux disease)    occ   History of kidney stones    Hypertension 12/14/2013   Hypogonadism male 12/14/2013   Kidney stones 02/10/2016   Myelopathy (HCC) 06/03/2014   Obesity    OSA (obstructive sleep apnea) 02/10/2016   Shortness of breath    occ-allergies   Sleep apnea    cpap 16 yrs    Family History  Problem Relation Age of Onset   Diabetes Mother    Arrhythmia Mother        afib   Heart failure Mother    CAD Father 10       CABG with redo   Healthy Daughter    Past Surgical History:  Procedure Laterality Date   ANTERIOR CERVICAL DECOMP/DISCECTOMY FUSION N/A 06/03/2014   Procedure: ANTERIOR CERVICAL DECOMPRESSION FUSION, CERVICAL FIVE-SIX, CERVICAL SIX-SEVEN WITH INSTRUMENTATION AND ALLOGRAFT;  Surgeon: Oneil Rodgers Priestly, MD;  Location: [facility] OR;  Service: Orthopedics;  Laterality: N/A;   CORONARY STENT INTERVENTION N/A 06/19/2019   Procedure: CORONARY STENT INTERVENTION;  Surgeon: Swazil[place], Peter M, MD;  Location: [facility] INVASIVE CV LAB;  Service: Cardiovascular;  Laterality: N/A;   I & D EXTREMITY Left 11/10/2018   Procedure: IRRIGATION AND DEBRIDEMENT EXTREMITY, REVISION AMPUTATION OF LEFT RING FINGER;  Surgeon: [name], MD;  Location: [facility] OR;  Service: Orthopedics;  Laterality: Left;   LEFT HEART CATH AND CORONARY ANGIOGRAPHY N/A 06/19/2019   Procedure: LEFT HEART CATH AND CORONARY ANGIOGRAPHY;  Surgeon: Swaziland, Peter M, MD;  Location: [facility] INVASIVE CV LAB;  Service: Cardiovascular;  Laterality: N/A;   URETHRAL DILATION  child   Social History   Social History Narrative   Not on file   Immunization History  Administered Date(s) Administered   Tdap 11/10/2018     Objective: Vital Signs: There were no vitals taken for this visit.   Physical Exam Vitals and nursing note reviewed.  Constitutional:      Appearance: He is well-developed.  HENT:     Head: Normocephalic and atraumatic.  Eyes:      Conjunctiva/sclera: Conjunctivae normal.     Pupils: Pupils are equal, round, and reactive to light.  Cardiovascular:     Rate and Rhythm: Normal rate and regular rhythm.     Heart sounds: Normal heart sounds.  Pulmonary:     Effort: Pulmonary effort is normal.     Breath sounds: Normal breath sounds.  Abdominal:     General: Bowel sounds are normal.     Palpations: Abdomen is soft.  Musculoskeletal:     Cervical back: Normal range of motion and neck supple.  Skin:    General: Skin is warm and dry.     Capillary Refill: Capillary refill takes less than 2 seconds.  Neurological:     Mental Status: He is alert and oriented to person, place, and time.  Psychiatric:        Behavior: Behavior normal.      Musculoskeletal Exam: ***  CDAI Exam: CDAI Score: -- Patient Global: --; Provider Global: -- Swollen: --; Tender: -- Joint Exam 08/04/2024   No joint exam has been documented for this visit   There is currently no information documented on the homunculus. Go to the Rheumatology activity and complete the homunculus joint exam.  Investigation: No additional findings.  Imaging: No results found.  Recent Labs: Lab Results  Component Value Date   WBC 7.6 11/07/2023   HGB 13.9 11/07/2023   PLT 276 11/07/2023   NA 138 03/02/2024   K 4.0 03/02/2024   CL 102 03/02/2024   CO2 26 03/02/2024   GLUCOSE 104 (H) 03/02/2024   BUN 18 03/02/2024   CREATININE 1.13 03/02/2024   BILITOT 0.4 03/02/2024   ALKPHOS 61 03/02/2024   AST 13 03/02/2024   ALT 10 03/02/2024   PROT 6.8 03/02/2024   ALBUMIN 4.3 03/02/2024   CALCIUM  9.3 03/02/2024   GFRAA 110 06/15/2019    Speciality Comments: No specialty comments available.  Procedures:  No procedures performed Allergies: Atorvastatin  and Rosuvastatin    Assessment / Plan:     Visit Diagnoses: Polyarthralgia  Chronic pain of both shoulders  Primary osteoarthritis of both hands  Amputation of left middle finger  Primary  osteoarthritis of both knees  Pes planus of both feet  DDD (degenerative disc disease), cervical  Lumbar spondylosis  Shortness of breath  Coronary artery disease involving native coronary artery of native heart without angina pectoris  Primary hypertension  Dyslipidemia  Hypogonadism male  GAD (generalized anxiety disorder)  Kidney stones  Obstructive sleep apnea syndrome  Orders: No orders of the defined types were placed in this encounter.  No orders of the defined types were placed in this encounter.   Face-to-face time spent with patient was *** minutes. Greater than 50% of time was spent in counseling and coordination of care.  Follow-Up Instructions: No follow-ups on file.   Waddell CHRISTELLA Craze, PA-C  Note - This record has been created using Dragon software.  Chart creation errors have been sought, but may not always  have been located. Such creation errors do not reflect on  the standard of medical care.

## 2024-07-29 ENCOUNTER — Telehealth: Payer: Self-pay

## 2024-07-29 ENCOUNTER — Ambulatory Visit: Admitting: Family Medicine

## 2024-07-29 ENCOUNTER — Encounter: Payer: Self-pay | Admitting: Family Medicine

## 2024-07-29 VITALS — BP 128/78 | HR 63 | Temp 98.4°F | Resp 18 | Ht 65.0 in | Wt 200.0 lb

## 2024-07-29 DIAGNOSIS — N529 Male erectile dysfunction, unspecified: Secondary | ICD-10-CM

## 2024-07-29 DIAGNOSIS — Z09 Encounter for follow-up examination after completed treatment for conditions other than malignant neoplasm: Secondary | ICD-10-CM | POA: Diagnosis not present

## 2024-07-29 MED ORDER — SILDENAFIL CITRATE 100 MG PO TABS: 50.0000 mg | ORAL_TABLET | Freq: Every day | ORAL | 1 refills | Status: DC | PRN

## 2024-07-29 NOTE — Patient Instructions (Addendum)
 Use [facility] for the sildenafil . Should be around $16.80.   Let us  know if you need anything.

## 2024-07-29 NOTE — Progress Notes (Unsigned)
 Chief Complaint  Patient presents with   Follow-up    Improving     Corey [name] is a 63 y.o. male here for a skin complaint.  Duration: 3 weeks Location: R forearm Pruritic? No Painful? Yes Drainage? No Trauma? No Other associated symptoms: no fevers Therapies tried thus far: doxycycline  Seems to be getting better.  Pt has an issue with attaining and maintaining an erection over the past 10 years. He is a widower and is now in talks with a new lady. He would like to be prepared. A friend gave him 100 mg of sildenafil  and it worked well. 50 mg tab rx'd by his heart doc was not helpful. Desire is normal. No AE's. Has not used his nitroglycerin  since receiving it.   ED   Past Medical History:  Diagnosis Date   Angina pectoris (HCC) 06/04/2019   Arthritis    Benign essential HTN 02/10/2016   Bronchospasm with bronchitis, acute 12/14/2013   Coronary artery disease status post PTCA and drug-eluting stent to circumflex artery in July 2020 06/26/2019   Dyslipidemia 09/24/2020   GAD (generalized anxiety disorder) 12/14/2013   GERD (gastroesophageal reflux disease)    occ   History of kidney stones    Hypertension 12/14/2013   Hypogonadism male 12/14/2013   Kidney stones 02/10/2016   Myelopathy (HCC) 06/03/2014   Obesity    OSA (obstructive sleep apnea) 02/10/2016   Shortness of breath    occ-allergies   Sleep apnea    cpap 16 yrs    BP 128/78   Pulse 63   Temp 98.4 F (36.9 C)   Resp 18   Ht 5' 5 (1.651 m)   Wt 200 lb (90.7 kg)   SpO2 99%   BMI 33.28 kg/m  Gen: awake, alert, appearing stated age Lungs: No accessory muscle use Skin: Indurated nodule over posterior R forearm. No drainage, erythema, TTP, fluctuance, excoriation Psych: Age appropriate judgment and insight  Erectile dysfunction, unspecified erectile dysfunction type - Plan: sildenafil  (VIAGRA ) 100 MG tablet  Follow-up for resolved condition  Chronic, not controlled. Sildenafil  100 mg every day prn.  Warned not to take nitroglycerin  with it but he has not taken ever. Had heart issues a few years back. No CP or SOB. Use GoodRx for this.  Skin issue improving. No changes.  F/u prn. The patient voiced understanding and agreement to the plan.  Mabel Mt [place], DO 07/30/24 8:03 AM

## 2024-07-29 NOTE — Telephone Encounter (Signed)
 Pt wanted an appt .SABRA Appt scheduled     Copied from CRM #[id number]. Topic: Clinical - Medical Advice >> Jul 29, 2024  2:20 PM Chasity T wrote: Reason for CRM: Patient is requesting for Dr Frann or nurse to contact him back did not explain concern.

## 2024-08-04 ENCOUNTER — Ambulatory Visit: Admitting: Physician Assistant

## 2024-08-04 DIAGNOSIS — M19041 Primary osteoarthritis, right hand: Secondary | ICD-10-CM

## 2024-08-04 DIAGNOSIS — F411 Generalized anxiety disorder: Secondary | ICD-10-CM

## 2024-08-04 DIAGNOSIS — I10 Essential (primary) hypertension: Secondary | ICD-10-CM

## 2024-08-04 DIAGNOSIS — M47816 Spondylosis without myelopathy or radiculopathy, lumbar region: Secondary | ICD-10-CM

## 2024-08-04 DIAGNOSIS — I2510 Atherosclerotic heart disease of native coronary artery without angina pectoris: Secondary | ICD-10-CM

## 2024-08-04 DIAGNOSIS — G4733 Obstructive sleep apnea (adult) (pediatric): Secondary | ICD-10-CM

## 2024-08-04 DIAGNOSIS — N200 Calculus of kidney: Secondary | ICD-10-CM

## 2024-08-04 DIAGNOSIS — S68113A Complete traumatic metacarpophalangeal amputation of left middle finger, initial encounter: Secondary | ICD-10-CM

## 2024-08-04 DIAGNOSIS — E785 Hyperlipidemia, unspecified: Secondary | ICD-10-CM

## 2024-08-04 DIAGNOSIS — M2141 Flat foot [pes planus] (acquired), right foot: Secondary | ICD-10-CM

## 2024-08-04 DIAGNOSIS — M5030 Other cervical disc degeneration, unspecified cervical region: Secondary | ICD-10-CM

## 2024-08-04 DIAGNOSIS — R0602 Shortness of breath: Secondary | ICD-10-CM

## 2024-08-04 DIAGNOSIS — E291 Testicular hypofunction: Secondary | ICD-10-CM

## 2024-08-04 DIAGNOSIS — G8929 Other chronic pain: Secondary | ICD-10-CM

## 2024-08-04 DIAGNOSIS — M2550 Pain in unspecified joint: Secondary | ICD-10-CM

## 2024-08-04 DIAGNOSIS — M170 Bilateral primary osteoarthritis of knee: Secondary | ICD-10-CM

## 2024-09-06 ENCOUNTER — Other Ambulatory Visit: Payer: Self-pay | Admitting: Family Medicine

## 2024-09-06 DIAGNOSIS — F411 Generalized anxiety disorder: Secondary | ICD-10-CM

## 2024-09-23 ENCOUNTER — Encounter: Payer: Self-pay | Admitting: Family Medicine

## 2024-09-23 ENCOUNTER — Other Ambulatory Visit (HOSPITAL_BASED_OUTPATIENT_CLINIC_OR_DEPARTMENT_OTHER): Payer: Self-pay

## 2024-09-23 ENCOUNTER — Ambulatory Visit: Admitting: Family Medicine

## 2024-09-23 ENCOUNTER — Ambulatory Visit: Payer: Self-pay

## 2024-09-23 VITALS — BP 120/84 | HR 100 | Temp 98.5°F | Resp 18 | Ht 65.0 in | Wt 208.2 lb

## 2024-09-23 DIAGNOSIS — F4323 Adjustment disorder with mixed anxiety and depressed mood: Secondary | ICD-10-CM

## 2024-09-23 DIAGNOSIS — F411 Generalized anxiety disorder: Secondary | ICD-10-CM | POA: Diagnosis not present

## 2024-09-23 MED ORDER — FLUOXETINE HCL 20 MG PO CAPS: 20.0000 mg | ORAL_CAPSULE | Freq: Every day | ORAL | 3 refills | Status: DC

## 2024-09-23 MED ORDER — HYDROXYZINE PAMOATE 25 MG PO CAPS: 25.0000 mg | ORAL_CAPSULE | Freq: Three times a day (TID) | ORAL | 0 refills | Status: DC | PRN

## 2024-09-23 NOTE — Patient Instructions (Addendum)
 Good to see you today- I am sorry you are going through a hard time I think you have a xanax  rx at the pharmacy that you can pick up- just take one at bedtime During the day use the hydroxyzine as needed for feeling anxious Also please start on the fluoxetine - prozac - 20 mg daily.  This will help with more long term anxiety and depression management!  Ok to increase to 40 mg after a week or so Please let us  know how you are doing- please schedule to see Dr Frann in about 2 weeks

## 2024-09-23 NOTE — Progress Notes (Signed)
 [facility] [address], [address] [place], [place] [address] [phone]  Date:  09/23/2024   Name:  Corey [name]   DOB:  1960-11-27   MRN:  [id number]  PCP:  [name], DO    Chief Complaint: Anxiety and Follow-up   History of Present Illness:  Corey [name] is a 63 y.o. very pleasant male patient who presents with the following:  Primary patient of Dr. Frann, seen today with concern of anxiety I have not seen him myself previously He was in and saw Dr. Frann in September His disease he has history of dyslipidemia, sleep apnea, osteoarthritis and degenerative disc disease, hypertension He does have a prescription for alprazolam    Discussed the use of AI scribe software for clinical note transcription with the patient, who gave verbal consent to proceed.  History of Present Illness Corey [name] is a 63 year old male who presents with severe anxiety and insomnia.  He has been experiencing severe anxiety and insomnia, exacerbated by recent personal events. His new GF said she wanted space after a recent beach trip together and this was really hard for him to take.  He feels abandoned again like when his wife died back in 10/27/20. For the last week he has been unable to sleep well and feels extremely nervous and uptight, with shakes and an inability to calm down.  He recounts a recent incident where he consumed a large quantity of THC gummies and alcohol, which was unusual for him. He has not done this again- it also did not make his GF happy   He has a history of anxiety, particularly related to the traumatic experience of losing his wife to COVID-19 in 10/27/2020. He describes the stress of watching her decline over 63 days and the impact of not being allowed to see her during her hospitalization due to CDC restrictions. This experience has left a lasting impact on his mental health.  He has been taking  Xanax , typically 1 mg at bedtime, to help with sleep, as he uses a CPAP machine. However, it has not been effective recently, and he has been taking it more frequently, up to three times a day, without relief. I advised his PCP Dr Frann is not going to be pleased to see him using his controlled substances more than prescribed, and encouraged him to go back to normal dosage.  It seems like he has a 90 day rx that is waiting for him to pick up so he has not actually run short   No current use of THC or alcohol since the incident on Saturday. No intention to harm himself. States he has done a lot of counseling since the death of his wife.  He would be willing to start on medication for depression    Patient Active Problem List   Diagnosis Date Noted   Dyslipidemia 09/24/2020   Sleep apnea    Shortness of breath    Obesity    History of kidney stones    Arthritis    Coronary artery disease status post PTCA and drug-eluting stent to circumflex artery in July 2020 06/26/2019   Angina pectoris 06/04/2019   Benign essential HTN 02/10/2016   OSA (obstructive sleep apnea) 02/10/2016   GERD (gastroesophageal reflux disease) 02/10/2016   Kidney stones 02/10/2016   Myelopathy (HCC) 06/03/2014   Bronchospasm with bronchitis, acute 12/14/2013   GAD (generalized anxiety disorder) 12/14/2013  Hypertension 12/14/2013   Hypogonadism male 12/14/2013    Past Medical History:  Diagnosis Date   Angina pectoris 06/04/2019   Arthritis    Benign essential HTN 02/10/2016   Bronchospasm with bronchitis, acute 12/14/2013   Coronary artery disease status post PTCA and drug-eluting stent to circumflex artery in July 2020 06/26/2019   Dyslipidemia 09/24/2020   GAD (generalized anxiety disorder) 12/14/2013   GERD (gastroesophageal reflux disease)    occ   History of kidney stones    Hypertension 12/14/2013   Hypogonadism male 12/14/2013   Kidney stones 02/10/2016   Myelopathy (HCC) 06/03/2014   Obesity    OSA  (obstructive sleep apnea) 02/10/2016   Shortness of breath    occ-allergies   Sleep apnea    cpap 16 yrs    Past Surgical History:  Procedure Laterality Date   ANTERIOR CERVICAL DECOMP/DISCECTOMY FUSION N/A 06/03/2014   Procedure: ANTERIOR CERVICAL DECOMPRESSION FUSION, CERVICAL FIVE-SIX, CERVICAL SIX-SEVEN WITH INSTRUMENTATION AND ALLOGRAFT;  Surgeon: Oneil Rodgers Priestly, MD;  Location: [facility] OR;  Service: Orthopedics;  Laterality: N/A;   CORONARY STENT INTERVENTION N/A 06/19/2019   Procedure: CORONARY STENT INTERVENTION;  Surgeon: Jordan, Peter M, MD;  Location: [facility] INVASIVE CV LAB;  Service: Cardiovascular;  Laterality: N/A;   I & D EXTREMITY Left 11/10/2018   Procedure: IRRIGATION AND DEBRIDEMENT EXTREMITY, REVISION AMPUTATION OF LEFT RING FINGER;  Surgeon: [name], MD;  Location: [facility] OR;  Service: Orthopedics;  Laterality: Left;   LEFT HEART CATH AND CORONARY ANGIOGRAPHY N/A 06/19/2019   Procedure: LEFT HEART CATH AND CORONARY ANGIOGRAPHY;  Surgeon: Jordan, Peter M, MD;  Location: [facility] INVASIVE CV LAB;  Service: Cardiovascular;  Laterality: N/A;   URETHRAL DILATION     child    Social History   Tobacco Use   Smoking status: Former    Current packs/day: 0.00    Average packs/day: 2.0 packs/day for 30.0 years (60.0 ttl pk-yrs)    Types: Cigarettes    Start date: 05/15/1981    Quit date: 05/16/2011    Years since quitting: 13.3    Passive exposure: Past   Smokeless tobacco: Never  Vaping Use   Vaping status: Former  Substance Use Topics   Alcohol use: Yes    Comment: occ   Drug use: No    Family History  Problem Relation Age of Onset   Diabetes Mother    Arrhythmia Mother        afib   Heart failure Mother    CAD Father 63       CABG with redo   Healthy Daughter     Allergies  Allergen Reactions   Atorvastatin  Other (See Comments)    Myalgias   Rosuvastatin  Other (See Comments)    Myalgias    Medication list has been reviewed and updated.  Current  Outpatient Medications on File Prior to Visit  Medication Sig Dispense Refill   acetaminophen  (TYLENOL ) 650 MG CR tablet Take 2,600 mg by mouth every 4 (four) hours.     albuterol  (VENTOLIN  HFA) 108 (90 Base) MCG/ACT inhaler Inhale 2 puffs into the lungs every 6 (six) hours as needed for wheezing or shortness of breath. 8 g 0   ALPRAZolam  (XANAX ) 1 MG tablet TAKE 1 TABLET BY MOUTH THREE TIMES DAILY AS NEEDED FOR ANXIETY 90 tablet 0   Ascorbic Acid (VITAMIN C PO) Take by mouth.     aspirin  EC 81 MG tablet Take 1 tablet (81 mg total) by mouth daily. 90 tablet 3  celecoxib  (CELEBREX ) 200 MG capsule Take 1 capsule by mouth once daily 90 capsule 0   Cyanocobalamin  (VITAMIN B-12 PO) Take by mouth.     fluticasone  (FLONASE ) 50 MCG/ACT nasal spray Place 2 sprays into both nostrils daily. 16 g 1   furosemide  (LASIX ) 20 MG tablet Take 1 tablet by mouth once daily 90 tablet 1   losartan -hydrochlorothiazide (HYZAAR) 100-25 MG tablet Take 1 tablet by mouth once daily 90 tablet 2   MAGNESIUM PO Take by mouth.     nitroGLYCERIN  (NITROSTAT ) 0.4 MG SL tablet Place 1 tablet (0.4 mg total) under the tongue every 5 (five) minutes as needed for chest pain. 5 tablet 0   Omega-3 Fatty Acids (OMEGA-3 FISH OIL PO) Take 1 tablet by mouth daily.     ranolazine  (RANEXA ) 1000 MG SR tablet Take 1 tablet by mouth twice daily 180 tablet 1   sildenafil  (VIAGRA ) 100 MG tablet Take 0.5-1 tablets (50-100 mg total) by mouth daily as needed for erectile dysfunction. 30 tablet 1   VITAMIN D PO Take by mouth.     No current facility-administered medications on file prior to visit.    Review of Systems:  As per HPI- otherwise negative.   Physical Examination: Vitals:   09/23/24 1422  BP: 120/84  Pulse: 100  Resp: 18  Temp: 98.5 F (36.9 C)  SpO2: 100%   Vitals:   09/23/24 1422  Weight: 208 lb 3.2 oz (94.4 kg)  Height: 5' 5 (1.651 m)   Body mass index is 34.65 kg/m. Ideal Body Weight: Weight in (lb) to have BMI  = 25: 149.9  GEN: no acute distress.  Moderately obese, appears physically well although somewhat anxious HEENT: Atraumatic, Normocephalic.  Ears and Nose: No external deformity. CV: RRR, No M/G/R. No JVD. No thrill. No extra heart sounds. PULM: CTA B, no wheezes, crackles, rhonchi. No retractions. No resp. distress. No accessory muscle use EXTR: No c/c/e PSYCH: Normally interactive. Conversant.    Assessment and Plan: GAD (generalized anxiety disorder) - Plan: hydrOXYzine (VISTARIL) 25 MG capsule, FLUoxetine  (PROZAC ) 20 MG capsule  Adjustment disorder with mixed anxiety and depressed mood - Plan: hydrOXYzine (VISTARIL) 25 MG capsule, FLUoxetine  (PROZAC ) 20 MG capsule  Assessment & Plan Anxiety disorder with insomnia Chronic anxiety exacerbated by relationship stress and past trauma. Severe anxiety with insomnia persists despite alprazolam . No suicidal ideation. Previous counseling noted. Requires additional support and medication adjustment.. - Initiate Prozac  for anxiety and mood improvement.  Start with 20, can go up to 40 mg daily in the next 1 to 2 weeks.  Also provided hydroxyzine to use as needed for daytime anxiety.  Encouraged him to use alprazolam  only at bedtime as directed-we think he actually has an Rx ready to pick up.  Alcohol use, episodic excessive Recent excessive alcohol consumption due to relationship stress. Described as an outlier, not a regular pattern.  He does not plan to contain his behavior Cannabis use, episodic Recent use of THC gummies in response to stress. Used as a coping mechanism during acute stress, not a regular pattern.  I asked him to please follow-up with his PCP in the next 2 to 3 weeks for recheck.  Please let me know sooner if not doing ok   Meds ordered this encounter  Medications   hydrOXYzine (VISTARIL) 25 MG capsule    Sig: Take 1 capsule (25 mg total) by mouth every 8 (eight) hours as needed.    Dispense:  45 capsule    Refill:  0    FLUoxetine  (PROZAC ) 20 MG capsule    Sig: Take 1 capsule (20 mg total) by mouth daily. Increase to 40 mg daily after about one week    Dispense:  90 capsule    Refill:  3     Signed Harlene Schroeder, MD

## 2024-09-23 NOTE — Telephone Encounter (Signed)
 FYI Only or Action Required?: FYI only for provider.  Patient was last seen in primary care on 07/29/2024 by [name], DO.  Called Nurse Triage reporting Anxiety.  Symptoms began a week ago.  Interventions attempted: Prescription medications: Xanax .  Symptoms are: gradually worsening.  Triage Disposition: See Physician Within 24 Hours  Patient/caregiver understands and will follow disposition?: Yes FYI- Patient was initally relunctant to see another provider. Called [facility] and was advised that PCP is not back in office until 10/31. Pt now agreeable to see Dr. Watt to address urgent symptoms.   Copied from CRM [id number]. Topic: Clinical - Red Word Triage >> Sep 23, 2024  8:02 AM Mia F wrote: Red Word that prompted transfer to Nurse Triage: Pt says he is having bad anxiety that seems to be worsening. Pt says he takes Xanax  and it has not been helping. He has not been able to sleep. Has been going on for about a week. Pt says his anxiety is very bad Reason for Disposition  Patient sounds very upset or troubled to the triager  Answer Assessment - Initial Assessment Questions 1. CONCERN: Did anything happen that prompted you to call today?      Patient recently  went through a break up. He says the episode if triggering for him because he lost his wife to COVID 4 years ago and he opened himself up to feelings again and this new person shut him out abruptly.  2. ANXIETY SYMPTOMS: Can you describe how you (your loved one; patient) have been feeling? (e.g., tense, restless, panicky, anxious, keyed up, overwhelmed, sense of impending doom).      Anxiety, overwhelmed, and restless  3. ONSET: How long have you been feeling this way? (e.g., hours, days, weeks)     1 week  4. SEVERITY: How would you rate the level of anxiety? (e.g., 0 - 10; or mild, moderate, severe).     Moderate  5. FUNCTIONAL IMPAIRMENT: How have these feelings affected your ability to do daily  activities? Have you had more difficulty than usual doing your normal daily activities? (e.g., getting better, same, worse; self-care, school, work, interactions)     Trouble sleeping, no appetite, has not been as compliant with current medication. He is still able to work (currently at work now) but he is very stressed.   6. HISTORY: Have you felt this way before? Have you ever been diagnosed with an anxiety problem in the past? (e.g., generalized anxiety disorder, panic attacks, PTSD). If Yes, ask: How was this problem treated? (e.g., medicines, counseling, etc.)     Yes, pt has history of generalized anxiety, more pronounce after his wife died 4 years ago.   7. RISK OF HARM - SUICIDAL IDEATION: Do you ever have thoughts of hurting or killing yourself? If Yes, ask:  Do you have these feelings now? Do you have a plan on how you would do this?     No thoughts of suicide at this time.   8. TREATMENT:  What has been done so far to treat this anxiety? (e.g., medicines, relaxation strategies). What has helped?     Has been taking Xanax  but it is not helping  9. THERAPIST: Do you have a counselor or therapist? If Yes, ask: What is their name?     No   10. POTENTIAL TRIGGERS: Do you drink caffeinated beverages (e.g., coffee, colas, teas), and how much daily? Do you drink alcohol or use any drugs? Have you started any new  medicines recently?       No  11. PATIENT SUPPORT: Who is with you now? Who do you live with? Do you have family or friends who you can talk to?        Has a daughter, but they have been arguing about the situation at hand. She wants him to stay away from the ex for the time being, but he's insistent on reach out to her and find out why she broke up with him  12. OTHER SYMPTOMS: Do you have any other symptoms? (e.g., feeling depressed, trouble concentrating, trouble sleeping, trouble breathing, palpitations or fast heartbeat, chest pain, sweating,  nausea, or diarrhea)       Shaking, crying, palpitations, trouble concentrating, chest burning  13. PREGNANCY: Is there any chance you are pregnant? When was your last menstrual period?       no  Protocols used: Anxiety and Panic Attack-A-AH

## 2024-09-25 ENCOUNTER — Encounter (HOSPITAL_BASED_OUTPATIENT_CLINIC_OR_DEPARTMENT_OTHER): Payer: Self-pay

## 2024-09-25 ENCOUNTER — Inpatient Hospital Stay (HOSPITAL_BASED_OUTPATIENT_CLINIC_OR_DEPARTMENT_OTHER)
Admission: EM | Admit: 2024-09-25 | Discharge: 2024-09-29 | DRG: 321 | Disposition: A | Discharge status: Home / Self Care | Attending: Cardiovascular Disease | Admitting: Cardiovascular Disease

## 2024-09-25 ENCOUNTER — Emergency Department (HOSPITAL_BASED_OUTPATIENT_CLINIC_OR_DEPARTMENT_OTHER)

## 2024-09-25 ENCOUNTER — Other Ambulatory Visit: Payer: Self-pay

## 2024-09-25 DIAGNOSIS — Z87891 Personal history of nicotine dependence: Secondary | ICD-10-CM

## 2024-09-25 DIAGNOSIS — G4733 Obstructive sleep apnea (adult) (pediatric): Secondary | ICD-10-CM | POA: Diagnosis present

## 2024-09-25 DIAGNOSIS — Z888 Allergy status to other drugs, medicaments and biological substances status: Secondary | ICD-10-CM | POA: Diagnosis not present

## 2024-09-25 DIAGNOSIS — E785 Hyperlipidemia, unspecified: Secondary | ICD-10-CM | POA: Diagnosis not present

## 2024-09-25 DIAGNOSIS — I2510 Atherosclerotic heart disease of native coronary artery without angina pectoris: Secondary | ICD-10-CM | POA: Diagnosis present

## 2024-09-25 DIAGNOSIS — I472 Ventricular tachycardia, unspecified: Secondary | ICD-10-CM | POA: Diagnosis present

## 2024-09-25 DIAGNOSIS — Y831 Surgical operation with implant of artificial internal device as the cause of abnormal reaction of the patient, or of later complication, without mention of misadventure at the time of the procedure: Secondary | ICD-10-CM | POA: Diagnosis present

## 2024-09-25 DIAGNOSIS — I10 Essential (primary) hypertension: Secondary | ICD-10-CM | POA: Diagnosis not present

## 2024-09-25 DIAGNOSIS — I209 Angina pectoris, unspecified: Secondary | ICD-10-CM | POA: Diagnosis not present

## 2024-09-25 DIAGNOSIS — E782 Mixed hyperlipidemia: Secondary | ICD-10-CM

## 2024-09-25 DIAGNOSIS — F411 Generalized anxiety disorder: Secondary | ICD-10-CM | POA: Diagnosis present

## 2024-09-25 DIAGNOSIS — Z833 Family history of diabetes mellitus: Secondary | ICD-10-CM | POA: Diagnosis not present

## 2024-09-25 DIAGNOSIS — I200 Unstable angina: Secondary | ICD-10-CM | POA: Diagnosis not present

## 2024-09-25 DIAGNOSIS — Z8249 Family history of ischemic heart disease and other diseases of the circulatory system: Secondary | ICD-10-CM

## 2024-09-25 DIAGNOSIS — Z955 Presence of coronary angioplasty implant and graft: Secondary | ICD-10-CM | POA: Diagnosis not present

## 2024-09-25 DIAGNOSIS — Z7982 Long term (current) use of aspirin: Secondary | ICD-10-CM

## 2024-09-25 DIAGNOSIS — R079 Chest pain, unspecified: Principal | ICD-10-CM

## 2024-09-25 DIAGNOSIS — Z981 Arthrodesis status: Secondary | ICD-10-CM

## 2024-09-25 DIAGNOSIS — I214 Non-ST elevation (NSTEMI) myocardial infarction: Secondary | ICD-10-CM | POA: Diagnosis not present

## 2024-09-25 DIAGNOSIS — I213 ST elevation (STEMI) myocardial infarction of unspecified site: Secondary | ICD-10-CM | POA: Diagnosis not present

## 2024-09-25 DIAGNOSIS — Z79899 Other long term (current) drug therapy: Secondary | ICD-10-CM | POA: Diagnosis not present

## 2024-09-25 DIAGNOSIS — Z87442 Personal history of urinary calculi: Secondary | ICD-10-CM

## 2024-09-25 DIAGNOSIS — T82897A Other specified complication of cardiac prosthetic devices, implants and grafts, initial encounter: Secondary | ICD-10-CM | POA: Diagnosis not present

## 2024-09-25 DIAGNOSIS — I700 Atherosclerosis of aorta: Secondary | ICD-10-CM | POA: Diagnosis not present

## 2024-09-25 LAB — TROPONIN I (HIGH SENSITIVITY)
Troponin I (High Sensitivity): 2295 ng/L (ref <18)
Troponin I (High Sensitivity): 5168 ng/L (ref <18)
Troponin I (High Sensitivity): 9375 ng/L (ref <18)

## 2024-09-25 LAB — CBC
HCT: 43 % (ref 39.0–52.0)
Hemoglobin: 14.8 g/dL (ref 13.0–17.0)
MCH: 32.2 pg (ref 26.0–34.0)
MCHC: 34.4 g/dL (ref 30.0–36.0)
MCV: 93.7 fL (ref 80.0–100.0)
Platelets: 280 K/uL (ref 150–400)
RBC: 4.59 MIL/uL (ref 4.22–5.81)
RDW: 12.5 % (ref 11.5–15.5)
WBC: 9.8 K/uL (ref 4.0–10.5)
nRBC: 0 % (ref 0.0–0.2)

## 2024-09-25 LAB — TROPONIN T, HIGH SENSITIVITY
Troponin T High Sensitivity: 20 ng/L — ABNORMAL HIGH (ref 0–19)
Troponin T High Sensitivity: 40 ng/L — ABNORMAL HIGH (ref 0–19)

## 2024-09-25 LAB — BASIC METABOLIC PANEL WITH GFR
Anion gap: 14 (ref 5–15)
BUN: 10 mg/dL (ref 8–23)
CO2: 22 mmol/L (ref 22–32)
Calcium: 9.8 mg/dL (ref 8.9–10.3)
Chloride: 104 mmol/L (ref 98–111)
Creatinine, Ser: 1.03 mg/dL (ref 0.61–1.24)
GFR, Estimated: >60 mL/min (ref >60)
Glucose, Bld: 108 mg/dL — ABNORMAL HIGH (ref 70–99)
Potassium: 3.9 mmol/L (ref 3.5–5.1)
Sodium: 140 mmol/L (ref 135–145)

## 2024-09-25 LAB — HEPARIN LEVEL (UNFRACTIONATED): Heparin Unfractionated: 0.1 [IU]/mL — ABNORMAL LOW (ref 0.30–0.70)

## 2024-09-25 MED ORDER — ASPIRIN 81 MG PO TBEC
81.0000 mg | DELAYED_RELEASE_TABLET | Freq: Every day | ORAL | Status: DC
  Administered 2024-09-26 – 2024-09-29 (×3): 81 mg via ORAL
  Filled 2024-09-25 (×3): qty 1

## 2024-09-25 MED ORDER — ALPRAZOLAM 0.5 MG PO TABS
1.0000 mg | ORAL_TABLET | Freq: Three times a day (TID) | ORAL | Status: DC | PRN
  Administered 2024-09-26 – 2024-09-29 (×4): 1 mg via ORAL
  Filled 2024-09-25 (×4): qty 2

## 2024-09-25 MED ORDER — ASPIRIN 81 MG PO CHEW
81.0000 mg | CHEWABLE_TABLET | ORAL | Status: AC
  Administered 2024-09-28: 81 mg via ORAL
  Filled 2024-09-25: qty 1

## 2024-09-25 MED ORDER — FENTANYL CITRATE (PF) 50 MCG/ML IJ SOSY
50.0000 ug | PREFILLED_SYRINGE | Freq: Once | INTRAMUSCULAR | Status: AC
  Administered 2024-09-25: 50 ug via INTRAVENOUS
  Filled 2024-09-25: qty 1

## 2024-09-25 MED ORDER — ALBUTEROL SULFATE (2.5 MG/3ML) 0.083% IN NEBU: 2.5000 mg | INHALATION_SOLUTION | RESPIRATORY_TRACT | Status: DC | PRN

## 2024-09-25 MED ORDER — ACETAMINOPHEN 325 MG PO TABS
650.0000 mg | ORAL_TABLET | ORAL | Status: DC | PRN
  Administered 2024-09-28: 650 mg via ORAL
  Filled 2024-09-25: qty 2

## 2024-09-25 MED ORDER — HYDROXYZINE HCL 25 MG PO TABS
25.0000 mg | ORAL_TABLET | Freq: Three times a day (TID) | ORAL | Status: DC | PRN
  Administered 2024-09-25: 25 mg via ORAL
  Filled 2024-09-25: qty 1

## 2024-09-25 MED ORDER — HEPARIN BOLUS VIA INFUSION
4000.0000 [IU] | Freq: Once | INTRAVENOUS | Status: AC
  Administered 2024-09-25: 4000 [IU] via INTRAVENOUS

## 2024-09-25 MED ORDER — HEPARIN BOLUS VIA INFUSION
3000.0000 [IU] | Freq: Once | INTRAVENOUS | Status: AC
  Administered 2024-09-25: 3000 [IU] via INTRAVENOUS
  Filled 2024-09-25: qty 3000

## 2024-09-25 MED ORDER — SODIUM CHLORIDE 0.9 % IV SOLN: 250.0000 mL | INTRAVENOUS | Status: AC | PRN

## 2024-09-25 MED ORDER — SODIUM CHLORIDE 0.9% FLUSH: 3.0000 mL | INTRAVENOUS | Status: DC | PRN

## 2024-09-25 MED ORDER — FLUTICASONE PROPIONATE 50 MCG/ACT NA SUSP
2.0000 | Freq: Every day | NASAL | Status: DC
  Administered 2024-09-27: 2 via NASAL
  Filled 2024-09-25 (×2): qty 16

## 2024-09-25 MED ORDER — ONDANSETRON HCL 4 MG/2ML IJ SOLN: 4.0000 mg | Freq: Four times a day (QID) | INTRAMUSCULAR | Status: DC | PRN

## 2024-09-25 MED ORDER — SODIUM CHLORIDE 0.9% FLUSH
3.0000 mL | Freq: Two times a day (BID) | INTRAVENOUS | Status: DC
  Administered 2024-09-25 – 2024-09-29 (×8): 3 mL via INTRAVENOUS

## 2024-09-25 MED ORDER — LOSARTAN POTASSIUM 50 MG PO TABS
100.0000 mg | ORAL_TABLET | Freq: Every day | ORAL | Status: DC
Start: 2024-09-26 — End: 2024-09-29
  Administered 2024-09-26 – 2024-09-27 (×2): 100 mg via ORAL
  Filled 2024-09-25 (×3): qty 2

## 2024-09-25 MED ORDER — FREE WATER
500.0000 mL | Freq: Once | Status: AC
  Administered 2024-09-28: 500 mL via ORAL

## 2024-09-25 MED ORDER — METOPROLOL SUCCINATE ER 25 MG PO TB24
25.0000 mg | ORAL_TABLET | Freq: Every day | ORAL | Status: DC
  Administered 2024-09-25 – 2024-09-27 (×3): 25 mg via ORAL
  Filled 2024-09-25 (×4): qty 1

## 2024-09-25 MED ORDER — HEPARIN BOLUS VIA INFUSION
4000.0000 [IU] | Freq: Once | INTRAVENOUS | Status: DC
  Filled 2024-09-25: qty 4000

## 2024-09-25 MED ORDER — RANOLAZINE ER 500 MG PO TB12
1000.0000 mg | ORAL_TABLET | Freq: Two times a day (BID) | ORAL | Status: DC
Start: 2024-09-25 — End: 2024-09-29
  Administered 2024-09-25 – 2024-09-29 (×8): 1000 mg via ORAL
  Filled 2024-09-25 (×8): qty 2

## 2024-09-25 MED ORDER — EZETIMIBE 10 MG PO TABS
10.0000 mg | ORAL_TABLET | Freq: Every day | ORAL | Status: DC
  Administered 2024-09-25 – 2024-09-29 (×5): 10 mg via ORAL
  Filled 2024-09-25 (×5): qty 1

## 2024-09-25 MED ORDER — ALBUTEROL SULFATE HFA 108 (90 BASE) MCG/ACT IN AERS: 2.0000 | INHALATION_SPRAY | Freq: Four times a day (QID) | RESPIRATORY_TRACT | Status: DC | PRN

## 2024-09-25 MED ORDER — ASPIRIN 81 MG PO CHEW
324.0000 mg | CHEWABLE_TABLET | Freq: Once | ORAL | Status: AC
  Administered 2024-09-25: 324 mg via ORAL
  Filled 2024-09-25: qty 4

## 2024-09-25 MED ORDER — HEPARIN (PORCINE) 25000 UT/250ML-% IV SOLN
1800.0000 [IU]/h | INTRAVENOUS | Status: DC
  Administered 2024-09-25: 1000 [IU]/h via INTRAVENOUS
  Administered 2024-09-26: 1250 [IU]/h via INTRAVENOUS
  Administered 2024-09-27: 1800 [IU]/h via INTRAVENOUS
  Administered 2024-09-27: 1450 [IU]/h via INTRAVENOUS
  Administered 2024-09-28: 1800 [IU]/h via INTRAVENOUS
  Filled 2024-09-25 (×6): qty 250

## 2024-09-25 MED ORDER — HYDROXYZINE PAMOATE 25 MG PO CAPS: 25.0000 mg | ORAL_CAPSULE | Freq: Three times a day (TID) | ORAL | Status: DC | PRN

## 2024-09-25 MED ORDER — NITROGLYCERIN 0.4 MG SL SUBL: 0.4000 mg | SUBLINGUAL_TABLET | SUBLINGUAL | Status: DC | PRN

## 2024-09-25 NOTE — H&P (Addendum)
 Cardiology Admission History and Physical   Patient ID: Corey [name] MRN: [id number]; DOB: 1961-10-14   Admission date: 09/25/2024  PCP:  [name], DO   [facility] Providers Cardiologist:  [name], MD       Chief Complaint:  chest pain  Patient Profile: Corey [name] is a 63 y.o. male with CAD s/p PTCA/stent to Cx in 2020 with known occluded RCA, dyslipidemia, HTN, arthritis, polyarthralgia followed by rheumatology, kidney stones, OSA on CPAP, generalized anxiety disorder, former tobacco use who is being seen 09/25/2024 for the evaluation of chest pain.  History of Present Illness: Corey [name] follows with Dr. Krasowski. Last cath was in 2020 with PCI to Cx as above with occluded RCA treated medically with robust R-R and L-R collaterals. Nuc 2022 showed small reversible defect of mild severity present in the basal inferior and basal inferolateral location felt consistent with ischemia in territory of known occluded RCA, managed medically, EF 54%. Echo 10/2023 showed EF 60-65%, G1DD.  He went up 4 flights of stairs at 10am today, subsequent to which he developed left sided chest pressure/hard chest pain that radiated across his chest and into both arms associated with diaphoresis. He rested and symptoms improved after an hour, did not take SL NTG. He then returned to the stair activity and noticed he was having to stop due to SOB. He also began to have recurrent chest pain around noon while exerting himself. Due to ongoing chest pain he came to [facility]. He does periodically have chest burning with emotional upset but this was different. He received 324mg  ASA, fentanyl  50mcg, and was started on IV heparin . He declined NTG since it historically has given him a terrible headache when used in the past. hsTroponin 20->40, CBC wnl, BMET OK except glu 108. CXR NAD. EKGs show NSR with nonspecific STTW changes. He is currently feeling better upon  arrival.  Past Medical History:  Diagnosis Date   Angina pectoris 06/04/2019   Arthritis    Benign essential HTN 02/10/2016   Bronchospasm with bronchitis, acute 12/14/2013   Coronary artery disease status post PTCA and drug-eluting stent to circumflex artery in July 2020 06/26/2019   Dyslipidemia 09/24/2020   GAD (generalized anxiety disorder) 12/14/2013   GERD (gastroesophageal reflux disease)    occ   History of kidney stones    Hypertension 12/14/2013   Hypogonadism male 12/14/2013   Kidney stones 02/10/2016   Myelopathy (HCC) 06/03/2014   Obesity    OSA (obstructive sleep apnea) 02/10/2016   Shortness of breath    occ-allergies   Sleep apnea    cpap 16 yrs   Past Surgical History:  Procedure Laterality Date   ANTERIOR CERVICAL DECOMP/DISCECTOMY FUSION N/A 06/03/2014   Procedure: ANTERIOR CERVICAL DECOMPRESSION FUSION, CERVICAL FIVE-SIX, CERVICAL SIX-SEVEN WITH INSTRUMENTATION AND ALLOGRAFT;  Surgeon: Oneil Rodgers Priestly, MD;  Location: [facility] OR;  Service: Orthopedics;  Laterality: N/A;   CORONARY STENT INTERVENTION N/A 06/19/2019   Procedure: CORONARY STENT INTERVENTION;  Surgeon: Jordan, Peter M, MD;  Location: [facility] INVASIVE CV LAB;  Service: Cardiovascular;  Laterality: N/A;   I & D EXTREMITY Left 11/10/2018   Procedure: IRRIGATION AND DEBRIDEMENT EXTREMITY, REVISION AMPUTATION OF LEFT RING FINGER;  Surgeon: [name], MD;  Location: [facility] OR;  Service: Orthopedics;  Laterality: Left;   LEFT HEART CATH AND CORONARY ANGIOGRAPHY N/A 06/19/2019   Procedure: LEFT HEART CATH AND CORONARY ANGIOGRAPHY;  Surgeon: Jordan, Peter M, MD;  Location: [facility] INVASIVE CV LAB;  Service: Cardiovascular;  Laterality: N/A;   URETHRAL DILATION     child     Medications Prior to Admission: Prior to Admission medications   Medication Sig Start Date End Date Taking? Authorizing Provider  acetaminophen  (TYLENOL ) 650 MG CR tablet Take 2,600 mg by mouth every 4 (four) hours.   Yes [provider]   albuterol  (VENTOLIN  HFA) 108 (90 Base) MCG/ACT inhaler Inhale 2 puffs into the lungs every 6 (six) hours as needed for wheezing or shortness of breath. 07/28/20  Yes Wendling, Mabel Mt, DO  ALPRAZolam  (XANAX ) 1 MG tablet TAKE 1 TABLET BY MOUTH THREE TIMES DAILY AS NEEDED FOR ANXIETY 09/07/24  Yes [name], DO  Ascorbic Acid (VITAMIN C PO) Take by mouth.   Yes [provider]  aspirin  EC 81 MG tablet Take 1 tablet (81 mg total) by mouth daily. 06/04/19  Yes Krasowski, Robert J, MD  celecoxib  (CELEBREX ) 200 MG capsule Take 1 capsule by mouth once daily 06/25/24  Yes Wendling, Mabel Mt, DO  Cyanocobalamin  (VITAMIN B-12 PO) Take by mouth.   Yes [provider]  fluticasone  (FLONASE ) 50 MCG/ACT nasal spray Place 2 sprays into both nostrils daily. 09/04/21  Yes Saguier, Dallas, PA-C  furosemide  (LASIX ) 20 MG tablet Take 1 tablet by mouth once daily 05/25/24  Yes Krasowski, Robert J, MD  hydrOXYzine (VISTARIL) 25 MG capsule Take 1 capsule (25 mg total) by mouth every 8 (eight) hours as needed. 09/23/24  Yes Copland, Harlene BROCKS, MD  losartan -hydrochlorothiazide (HYZAAR) 100-25 MG tablet Take 1 tablet by mouth once daily 12/31/23  Yes Krasowski, Robert J, MD  MAGNESIUM PO Take by mouth.   Yes [provider]  nitroGLYCERIN  (NITROSTAT ) 0.4 MG SL tablet Place 1 tablet (0.4 mg total) under the tongue every 5 (five) minutes as needed for chest pain. 01/18/21  Yes Aberman, Caroline C, PA-C  Omega-3 Fatty Acids (OMEGA-3 FISH OIL PO) Take 1 tablet by mouth daily.   Yes [provider]  ranolazine  (RANEXA ) 1000 MG SR tablet Take 1 tablet by mouth twice daily 05/25/24  Yes Krasowski, Robert J, MD  VITAMIN D PO Take 5,000 Units by mouth daily.   Yes [provider]  FLUoxetine  (PROZAC ) 20 MG capsule Take 1 capsule (20 mg total) by mouth daily. Increase to 40 mg daily after about one week 09/23/24   Copland, Harlene BROCKS, MD  sildenafil  (VIAGRA ) 100 MG tablet  Take 0.5-1 tablets (50-100 mg total) by mouth daily as needed for erectile dysfunction. 07/29/24   [name], DO     Allergies:    Allergies  Allergen Reactions   Atorvastatin  Other (See Comments)    Myalgias   Rosuvastatin  Other (See Comments)    Myalgias    Social History:   Social History   Socioeconomic History   Marital status: Widowed    Spouse name: Not on file   Number of children: Not on file   Years of education: Not on file   Highest education level: Not on file  Occupational History   Not on file  Tobacco Use   Smoking status: Former    Current packs/day: 0.00    Average packs/day: 2.0 packs/day for 30.0 years (60.0 ttl pk-yrs)    Types: Cigarettes    Start date: 05/15/1981    Quit date: 05/16/2011    Years since quitting: 13.3    Passive exposure: Past   Smokeless tobacco: Never  Vaping Use   Vaping status: Former  Substance and  Sexual Activity   Alcohol use: Yes    Comment: rare   Drug use: No   Sexual activity: Not on file  Other Topics Concern   Not on file  Social History Narrative   Not on file   Social Drivers of Health   Financial Resource Strain: Not on file  Food Insecurity: Not on file  Transportation Needs: Not on file  Physical Activity: Not on file  Stress: Not on file  Social Connections: Not on file  Intimate Partner Violence: Not on file     Family History:   The patient's family history includes Arrhythmia in his mother; CAD (age of onset: 21) in his father; Diabetes in his mother; Healthy in his daughter; Heart failure in his mother.    ROS:  Please see the history of present illness.  All other ROS reviewed and negative.     Physical Exam/Data: Vitals:   09/25/24 1445 09/25/24 1515 09/25/24 1545 09/25/24 1654  BP: (!) 143/76 132/78 (!) 165/99 (!) 137/97  Pulse: 67 68 63 62  Resp: 19 (!) 22 17 18   Temp:    98.1 F (36.7 C)  TempSrc:    Oral  SpO2: 95% 96% 98%   Weight:       No intake or output data in  the 24 hours ending 09/25/24 1717    09/25/2024   12:44 PM 09/23/2024    2:22 PM 07/29/2024    3:14 PM  Last 3 Weights  Weight (lbs) 208 lb 208 lb 3.2 oz 200 lb  Weight (kg) 94.348 kg 94.439 kg 90.719 kg     Body mass index is 34.61 kg/m.  Exam per MD   EKG:  The ECG that was done today was personally reviewed and demonstrates: 1st tracing shows NSR 74bpm, nonspecific STTW changes avL, V2. Repeat EKG appears similar. There is early ST upsloping inferiorly and V6.  Relevant CV Studies: 2d echo 10/2023 1. Left ventricular ejection fraction, by estimation, is 60 to 65%. The  left ventricle has normal function. The left ventricle has no regional  wall motion abnormalities. Left ventricular diastolic parameters are  consistent with Grade I diastolic  dysfunction (impaired relaxation).   2. Right ventricular systolic function is normal. The right ventricular  size is normal.   3. The mitral valve is normal in structure. No evidence of mitral valve  regurgitation. No evidence of mitral stenosis.   4. The aortic valve is normal in structure. Aortic valve regurgitation is  not visualized. Aortic valve sclerosis is present, with no evidence of  aortic valve stenosis.   5. The inferior vena cava is normal in size with greater than 50%  respiratory variability, suggesting right atrial pressure of 3 mmHg.   Comparison(s): Echocardiogram done 03/08/16 showed an EF of 60-65%. Cardiac  catheterization done 06/19/19 showed an EF of 55-65%.   See EMR otherwise  Laboratory Data: High Sensitivity Troponin:  No results for input(s): TROPONINIHS in the last 720 hours.    Chemistry Recent Labs  Lab 09/25/24 1301  NA 140  K 3.9  CL 104  CO2 22  GLUCOSE 108*  BUN 10  CREATININE 1.03  CALCIUM  9.8  GFRNONAA >60  ANIONGAP 14     Hematology Recent Labs  Lab 09/25/24 1301  WBC 9.8  RBC 4.59  HGB 14.8  HCT 43.0  MCV 93.7  MCH 32.2  MCHC 34.4  RDW 12.5  PLT 280     Radiology/Studies:  DG Chest Portable 1 View  Result Date: 09/25/2024 EXAM: 1 VIEW(S) XRAY OF THE CHEST 09/25/2024 02:29:00 PM COMPARISON: None available. CLINICAL HISTORY: cp FINDINGS: LUNGS AND PLEURA: No focal pulmonary opacity. No pulmonary edema. No pleural effusion. No pneumothorax. HEART AND MEDIASTINUM: Aortic calcification. No acute abnormality of the cardiac and mediastinal silhouettes. BONES AND SOFT TISSUES: Cervical spine surgical hardware. No acute osseous abnormality. IMPRESSION: 1. No acute cardiopulmonary process. . Electronically signed by: Norleen Boxer MD 09/25/2024 03:26 PM EDT RP Workstation: [id number]     Assessment and Plan:  # NSTEMI - With rapidly progressive symptoms of angina.  Known CTO of the RCA.  Status post PCI to the circumflex and 2020. - EKG with subtle ST depressions in the anterior leads. - Troponin 20 and 40 on repeat. - Reports chest symptoms are minimal at this time. - Continue aspirin  81 mg daily and heparin  drip. - Working diagnosis is non-STEMI.  Discussed left heart catheterization which will take place Monday.  Risk and benefits explained.  He is willing to proceed. - Echocardiogram ordered for tomorrow, A1c, TSH and lipid profile tomorrow. Lpa.  - He is really not wanting to take nitroglycerin  due to headache.  We will start him on metoprolol  succinate 25 mg daily.  Continue home Ranexa .  Could add amlodipine as well.  Also could challenge him again on Imdur .  He takes Viagra  but has not taken it in months. - Allergies to Crestor  and Lipitor.  Start Zetia  10 mg daily.  Consideration for outpatient PCSK9 inhibitor therapy.  Informed Consent   Shared Decision Making/Informed Consent The risks [stroke (1 in 1000), death (1 in 1000), kidney failure [usually temporary] (1 in 500), bleeding (1 in 200), allergic reaction [possibly serious] (1 in 200)], benefits (diagnostic support and management of coronary artery disease) and alternatives of a  cardiac catheterization were discussed in detail with Mr. Shea and he is willing to proceed.    # HTN - Stop HCTZ.  Continue losartan  100 mg daily.  Adding metoprolol  succinate 25 mg daily.  Would suggest amlodipine.  Could also challenge him on Imdur .  # HLD - Statin intolerance seems.  Zetia  10 mg daily.  Consider outpatient PCSK9 inhibitor therapy.  # OSA  - Reports compliance with CPAP.   FEN - No intravenous fluids - Code: Full - DVT PPx: Heparin  drip - Diet: Heart healthy - Disposition: Pending left heart cath on Monday   Risk Assessment/Risk Scores:   TIMI Risk Score for Unstable Angina or Non-ST Elevation MI:   The patient's TIMI risk score is 5, which indicates a 26% risk of all cause mortality, new or recurrent myocardial infarction or need for urgent revascularization in the next 14 days.     Code Status: Full Code  Severity of Illness: The appropriate patient status for this patient is OBSERVATION. Observation status is judged to be reasonable and necessary in order to provide the required intensity of service to ensure the patient's safety. The patient's presenting symptoms, physical exam findings, and initial radiographic and laboratory data in the context of their medical condition is felt to place them at decreased risk for further clinical deterioration. Furthermore, it is anticipated that the patient will be medically stable for discharge from the hospital within 2 midnights of admission.   For questions or updates, please contact [facility] Please consult [email or web address] for contact info under    Signed, Darryle T. Barbaraann, MD, [facility]  [facility]  [address] [place], [place] [address] [phone]  5:19  PM

## 2024-09-25 NOTE — Progress Notes (Signed)
   09/25/24 2319  BiPAP/CPAP/SIPAP  BiPAP/CPAP/SIPAP Pt Type Adult  BiPAP/CPAP/SIPAP Resmed  Reason BIPAP/CPAP not in use  (pt self wears)  Mask Type Nasal mask  PEEP 14 cmH20  FiO2 (%) 21 %  Patient Home Machine Yes  Safety Check Completed by RT for Home Unit Yes, no issues noted  Patient Home Mask Yes  Patient Home Tubing Yes  Auto Titrate No  Device Plugged into RED Power Outlet Yes  BiPAP/CPAP /SiPAP Vitals  Pulse Rate (!) 46  Resp 18  Bilateral Breath Sounds Clear;Diminished

## 2024-09-25 NOTE — ED Provider Notes (Signed)
 [place] [facility]PART[facility] AT [facility] Provider Note   CSN: [id number] Arrival date & time: 09/25/24  1231     Patient presents with: Chest Pain   Corey [name] is a 63 y.o. male.   Patient with known CAD, stent history presents with diaphoresis and anterior chest pain radiating to the neck since prior to arrival.  Patient was having intermittent mild symptoms this morning and then worsened with exertion.  Patient has history of significant coronary artery disease.  Patient has seen seen primary care recently due to anxiety partial related to his wife dying from COVID.  The history is provided by the patient.  Chest Pain Associated symptoms: diaphoresis   Associated symptoms: no abdominal pain, no back pain, no fever, no headache, no shortness of breath and no vomiting        Prior to Admission medications   Medication Sig Start Date End Date Taking? Authorizing Provider  acetaminophen  (TYLENOL ) 650 MG CR tablet Take 2,600 mg by mouth every 4 (four) hours.    [provider]  albuterol  (VENTOLIN  HFA) 108 (90 Base) MCG/ACT inhaler Inhale 2 puffs into the lungs every 6 (six) hours as needed for wheezing or shortness of breath. 07/28/20   [name], DO  ALPRAZolam  (XANAX ) 1 MG tablet TAKE 1 TABLET BY MOUTH THREE TIMES DAILY AS NEEDED FOR ANXIETY 09/07/24   Wendling, Mabel Mt, DO  Ascorbic Acid (VITAMIN C PO) Take by mouth.    [provider]  aspirin  EC 81 MG tablet Take 1 tablet (81 mg total) by mouth daily. 06/04/19   Krasowski, Robert J, MD  celecoxib  (CELEBREX ) 200 MG capsule Take 1 capsule by mouth once daily 06/25/24   [name], DO  Cyanocobalamin  (VITAMIN B-12 PO) Take by mouth.    [provider]  FLUoxetine  (PROZAC ) 20 MG capsule Take 1 capsule (20 mg total) by mouth daily. Increase to 40 mg daily after about one week 09/23/24   Copland, Harlene BROCKS, MD  fluticasone  (FLONASE ) 50 MCG/ACT nasal spray  Place 2 sprays into both nostrils daily. 09/04/21   Saguier, Dallas, PA-C  furosemide  (LASIX ) 20 MG tablet Take 1 tablet by mouth once daily 05/25/24   Krasowski, Robert J, MD  hydrOXYzine (VISTARIL) 25 MG capsule Take 1 capsule (25 mg total) by mouth every 8 (eight) hours as needed. 09/23/24   Copland, Harlene BROCKS, MD  losartan -hydrochlorothiazide (HYZAAR) 100-25 MG tablet Take 1 tablet by mouth once daily 12/31/23   Krasowski, Robert J, MD  MAGNESIUM PO Take by mouth.    [provider]  nitroGLYCERIN  (NITROSTAT ) 0.4 MG SL tablet Place 1 tablet (0.4 mg total) under the tongue every 5 (five) minutes as needed for chest pain. 01/18/21   Aberman, Caroline C, PA-C  Omega-3 Fatty Acids (OMEGA-3 FISH OIL PO) Take 1 tablet by mouth daily.    [provider]  ranolazine  (RANEXA ) 1000 MG SR tablet Take 1 tablet by mouth twice daily 05/25/24   Krasowski, Robert J, MD  sildenafil  (VIAGRA ) 100 MG tablet Take 0.5-1 tablets (50-100 mg total) by mouth daily as needed for erectile dysfunction. 07/29/24   [name], DO  VITAMIN D PO Take by mouth.    [provider]    Allergies: Atorvastatin  and Rosuvastatin     Review of Systems  Constitutional:  Positive for diaphoresis. Negative for chills and fever.  HENT:  Negative for congestion.   Eyes:  Negative for visual disturbance.  Respiratory:  Negative  for shortness of breath.   Cardiovascular:  Positive for chest pain.  Gastrointestinal:  Negative for abdominal pain and vomiting.  Genitourinary:  Negative for dysuria and flank pain.  Musculoskeletal:  Negative for back pain, neck pain and neck stiffness.  Skin:  Negative for rash.  Neurological:  Negative for light-headedness and headaches.    Updated Vital Signs BP 132/83 (BP Location: Right Arm)   Pulse 73   Temp 98.3 F (36.8 C) (Oral)   Resp 19   Wt 94.3 kg   SpO2 96%   BMI 34.61 kg/m   Physical Exam Vitals and nursing note reviewed.  Constitutional:       General: He is not in acute distress.    Appearance: He is well-developed.  HENT:     Head: Normocephalic and atraumatic.     Mouth/Throat:     Mouth: Mucous membranes are moist.  Eyes:     General:        Right eye: No discharge.        Left eye: No discharge.     Conjunctiva/sclera: Conjunctivae normal.  Neck:     Trachea: No tracheal deviation.  Cardiovascular:     Rate and Rhythm: Normal rate and regular rhythm.     Heart sounds: No murmur heard. Pulmonary:     Effort: Pulmonary effort is normal.     Breath sounds: Normal breath sounds.  Abdominal:     General: There is no distension.     Palpations: Abdomen is soft.     Tenderness: There is no abdominal tenderness. There is no guarding.  Musculoskeletal:     Cervical back: Normal range of motion and neck supple. No rigidity.  Skin:    General: Skin is warm.     Capillary Refill: Capillary refill takes less than 2 seconds.     Findings: No rash.  Neurological:     General: No focal deficit present.     Mental Status: He is alert.     Cranial Nerves: No cranial nerve deficit.  Psychiatric:        Mood and Affect: Mood normal.     (all labs ordered are listed, but only abnormal results are displayed) Labs Reviewed  BASIC METABOLIC PANEL WITH GFR - Abnormal; Notable for the following components:      Result Value   Glucose, Bld 108 (*)    All other components within normal limits  TROPONIN T, HIGH SENSITIVITY - Abnormal; Notable for the following components:   Troponin T High Sensitivity 20 (*)    All other components within normal limits  CBC  HEPARIN  LEVEL (UNFRACTIONATED)    EKG: EKG Interpretation Date/Time:  Friday September 25 2024 12:46:33 EDT Ventricular Rate:  74 PR Interval:  131 QRS Duration:  101 QT Interval:  395 QTC Calculation: 439 R Axis:   49  Text Interpretation: Sinus rhythm Abnormal R-wave progression, early transition Borderline ST depression, anterior leads Confirmed by Tonia Chew  [phone]) on 09/25/2024 1:30:55 PM  Radiology: No results found.   .Critical Care  Performed by: Tonia Chew, MD Authorized by: Tonia Chew, MD   Critical care provider statement:    Critical care time (minutes):  30   Critical care start time:  09/25/2024 1:30 PM   Critical care end time:  09/25/2024 2:00 PM   Critical care time was exclusive of:  Separately billable procedures and treating other patients and teaching time   Critical care was time spent personally by me on the  following activities:  Ordering and review of laboratory studies, ordering and review of radiographic studies, discussions with consultants, pulse oximetry and re-evaluation of patient's condition    Medications Ordered in the ED  nitroGLYCERIN  (NITROSTAT ) SL tablet 0.4 mg (has no administration in time range)  heparin  ADULT infusion 100 units/mL (25000 units/250mL) (1,000 Units/hr Intravenous New Bag/Given 09/25/24 1357)  aspirin  chewable tablet 324 mg (324 mg Oral Given 09/25/24 1302)  fentaNYL  (SUBLIMAZE ) injection 50 mcg (50 mcg Intravenous Given 09/25/24 1335)  heparin  bolus via infusion 4,000 Units (4,000 Units Intravenous Bolus from Bag 09/25/24 1357)                                    Medical Decision Making Amount and/or Complexity of Data Reviewed Labs: ordered. Radiology: ordered. ECG/medicine tests: ordered.  Risk OTC drugs. Prescription drug management. Decision regarding hospitalization.   Patient with known significant coronary artery disease presents with exertional chest pain and diaphoresis.  EKG ordered on arrival and reviewed showing mild ST depression in anterior leads.  Patient having moderate chest discomfort Fentanyl  ordered for pain aspirin  ordered as well.  Due to being at med center without cardiology in house discussed with Dr. Vernice early for transfer for telemetry observation and any decision on further testing or management.  Nitro ordered, repeat EKG shows  persistent mild ST depression anteriorly.  Concern at this time for unstable angina.  Considered anxiety/stress related however with exertional component/diaphoresis and ST depression higher concern for angina.  Heparin  drip ordered. Medical records reviewed from 2020 left heart cath 1. 2 vessel obstructive CAD.    - 90% mid LCx    - 100% CTO of the mid RCA. He has robust right to right and left to right collaterals. 2. Normal LV function 3. Normal LVEDP 4. Successful PCI of the LCx with DES x 1     Final diagnoses:  Acute chest pain  Cardiac angina    ED Discharge Orders     None          Tonia Chew, MD 09/25/24 1401

## 2024-09-25 NOTE — ED Notes (Signed)
 Reports called to Ozell, CHARITY FUNDRAISER at [facility]

## 2024-09-25 NOTE — Progress Notes (Addendum)
 PDMP reviewed for home Xanax  rx, no inappropriate fills; reordered for if hydroxyzine does not control anxiety per MD.  LP(a) also queuing up for already completed in 2022, 109.8 so removed from orders.  Addendum: f/u troponin 2295, further supporting dx of NSTEMI. Relayed to nurse who gives update that patient remains free of any symptoms. Will continue to trend - signed out to on call fellow at shift change to follow result. Nurse will notify if patient develops any new/worsening symptoms. Repeat EKG stable.

## 2024-09-25 NOTE — Progress Notes (Signed)
 PHARMACY - ANTICOAGULATION CONSULT NOTE  Pharmacy Consult for heparin   Indication: chest pain/ACS  Allergies  Allergen Reactions   Atorvastatin  Other (See Comments)    Myalgias   Rosuvastatin  Other (See Comments)    Myalgias    Patient Measurements: Height: 5' 5 (165.1 cm) Weight: 91.1 kg (200 lb 12.8 oz) IBW/kg (Calculated) : 61.5 HEPARIN  DW (KG): 81.1  Vital Signs: Temp: 98.3 F (36.8 C) (10/31 1939) Temp Source: Oral (10/31 1939) BP: 132/82 (10/31 1939) Pulse Rate: 69 (10/31 1939)  Labs: Recent Labs    09/25/24 1301 09/25/24 1816 09/25/24 2014 09/25/24 2057  HGB 14.8  --   --   --   HCT 43.0  --   --   --   PLT 280  --   --   --   HEPARINUNFRC  --   --   --  0.10*  CREATININE 1.03  --   --   --   TROPONINIHS  --  2,295* 5,168*  --     Estimated Creatinine Clearance: 77.1 mL/min (by C-G formula based on SCr of 1.03 mg/dL).   Medical History: Past Medical History:  Diagnosis Date   Angina pectoris 06/04/2019   Arthritis    Benign essential HTN 02/10/2016   Bronchospasm with bronchitis, acute 12/14/2013   Coronary artery disease status post PTCA and drug-eluting stent to circumflex artery in July 2020 06/26/2019   Dyslipidemia 09/24/2020   GAD (generalized anxiety disorder) 12/14/2013   GERD (gastroesophageal reflux disease)    occ   History of kidney stones    Hypertension 12/14/2013   Hypogonadism male 12/14/2013   Kidney stones 02/10/2016   Myelopathy (HCC) 06/03/2014   Obesity    OSA (obstructive sleep apnea) 02/10/2016   Shortness of breath    occ-allergies   Sleep apnea    cpap 16 yrs      Assessment: 62 yom presented to the ED with NSTEMI and angina. No anticoagulation prior to admission. Pharmacy consulted for heparin .    Heparin  level 0.1 is subtherapeutic on 1000 units/hr.  No issues with infusion or bleeding per RN. Planning cath 11/3.    Goal of Therapy:  Heparin  level 0.3-0.7 units/ml Monitor platelets by anticoagulation protocol: Yes    Plan:  Heparin  re-bolus 3000 unit x1 and increase to 1250 units/hr Monitor daily heparin  level, CBC, signs/symptoms of bleeding  F/u cath  Jinnie Door, PharmD, BCPS, [facility] Clinical Pharmacist  Please check AMION for all [facility] Pharmacy phone numbers After 10:00 PM, call Main Pharmacy [phone]

## 2024-09-25 NOTE — Progress Notes (Signed)
 PHARMACY - ANTICOAGULATION CONSULT NOTE  Pharmacy Consult for heparin   Indication: chest pain/ACS  Allergies  Allergen Reactions   Atorvastatin  Other (See Comments)    Myalgias   Rosuvastatin  Other (See Comments)    Myalgias    Patient Measurements: Weight: 94.3 kg (208 lb)  Vital Signs: Temp: 98.3 F (36.8 C) (10/31 1250) Temp Source: Oral (10/31 1250) BP: 132/83 (10/31 1250) Pulse Rate: 73 (10/31 1250)  Labs: Recent Labs    09/25/24 1301  HGB 14.8  HCT 43.0  PLT 280  CREATININE 1.03    Estimated Creatinine Clearance: 78.5 mL/min (by C-G formula based on SCr of 1.03 mg/dL).   Medical History: Past Medical History:  Diagnosis Date   Angina pectoris 06/04/2019   Arthritis    Benign essential HTN 02/10/2016   Bronchospasm with bronchitis, acute 12/14/2013   Coronary artery disease status post PTCA and drug-eluting stent to circumflex artery in July 2020 06/26/2019   Dyslipidemia 09/24/2020   GAD (generalized anxiety disorder) 12/14/2013   GERD (gastroesophageal reflux disease)    occ   History of kidney stones    Hypertension 12/14/2013   Hypogonadism male 12/14/2013   Kidney stones 02/10/2016   Myelopathy (HCC) 06/03/2014   Obesity    OSA (obstructive sleep apnea) 02/10/2016   Shortness of breath    occ-allergies   Sleep apnea    cpap 16 yrs    Medications:  Infusions:   heparin       Assessment: 62 yom presented to the ED with CP. To start IV heparin . Baseline CBC is WNL and he is not on anticoagulation PTA.   Goal of Therapy:  Heparin  level 0.3-0.7 units/ml Monitor platelets by anticoagulation protocol: Yes   Plan:  Heparin  bolus 4000 units IV x 1 Heparin  gtt 1000 units/hr Check a 6 hr HL Daily HL and CBC  Joesphine Schemm, Vernell Helling 09/25/2024,1:49 PM

## 2024-09-25 NOTE — ED Triage Notes (Signed)
 Chest pain across chest , left axilla and radiating into neck. Episode of pain this morning. Diaphoresis with first episode  Pain returned when climbing a ladder. Hx of stents

## 2024-09-26 ENCOUNTER — Observation Stay (HOSPITAL_COMMUNITY)

## 2024-09-26 DIAGNOSIS — Z87891 Personal history of nicotine dependence: Secondary | ICD-10-CM | POA: Diagnosis not present

## 2024-09-26 DIAGNOSIS — Z79899 Other long term (current) drug therapy: Secondary | ICD-10-CM | POA: Diagnosis not present

## 2024-09-26 DIAGNOSIS — Z955 Presence of coronary angioplasty implant and graft: Secondary | ICD-10-CM | POA: Diagnosis not present

## 2024-09-26 DIAGNOSIS — Z8249 Family history of ischemic heart disease and other diseases of the circulatory system: Secondary | ICD-10-CM | POA: Diagnosis not present

## 2024-09-26 DIAGNOSIS — I10 Essential (primary) hypertension: Secondary | ICD-10-CM | POA: Diagnosis not present

## 2024-09-26 DIAGNOSIS — Z833 Family history of diabetes mellitus: Secondary | ICD-10-CM | POA: Diagnosis not present

## 2024-09-26 DIAGNOSIS — I200 Unstable angina: Secondary | ICD-10-CM | POA: Diagnosis present

## 2024-09-26 DIAGNOSIS — I2510 Atherosclerotic heart disease of native coronary artery without angina pectoris: Secondary | ICD-10-CM | POA: Diagnosis not present

## 2024-09-26 DIAGNOSIS — I209 Angina pectoris, unspecified: Secondary | ICD-10-CM

## 2024-09-26 DIAGNOSIS — E785 Hyperlipidemia, unspecified: Secondary | ICD-10-CM | POA: Diagnosis not present

## 2024-09-26 DIAGNOSIS — Z888 Allergy status to other drugs, medicaments and biological substances status: Secondary | ICD-10-CM | POA: Diagnosis not present

## 2024-09-26 DIAGNOSIS — F411 Generalized anxiety disorder: Secondary | ICD-10-CM | POA: Diagnosis present

## 2024-09-26 DIAGNOSIS — I213 ST elevation (STEMI) myocardial infarction of unspecified site: Secondary | ICD-10-CM | POA: Diagnosis not present

## 2024-09-26 DIAGNOSIS — Z7982 Long term (current) use of aspirin: Secondary | ICD-10-CM | POA: Diagnosis not present

## 2024-09-26 DIAGNOSIS — G4733 Obstructive sleep apnea (adult) (pediatric): Secondary | ICD-10-CM | POA: Diagnosis not present

## 2024-09-26 DIAGNOSIS — Y831 Surgical operation with implant of artificial internal device as the cause of abnormal reaction of the patient, or of later complication, without mention of misadventure at the time of the procedure: Secondary | ICD-10-CM | POA: Diagnosis present

## 2024-09-26 DIAGNOSIS — I472 Ventricular tachycardia, unspecified: Secondary | ICD-10-CM | POA: Diagnosis not present

## 2024-09-26 DIAGNOSIS — Z87442 Personal history of urinary calculi: Secondary | ICD-10-CM | POA: Diagnosis not present

## 2024-09-26 DIAGNOSIS — Z981 Arthrodesis status: Secondary | ICD-10-CM | POA: Diagnosis not present

## 2024-09-26 DIAGNOSIS — I214 Non-ST elevation (NSTEMI) myocardial infarction: Secondary | ICD-10-CM | POA: Diagnosis not present

## 2024-09-26 DIAGNOSIS — T82897A Other specified complication of cardiac prosthetic devices, implants and grafts, initial encounter: Secondary | ICD-10-CM | POA: Diagnosis not present

## 2024-09-26 LAB — CBC
HCT: 39.3 % (ref 39.0–52.0)
Hemoglobin: 13.8 g/dL (ref 13.0–17.0)
MCH: 32.9 pg (ref 26.0–34.0)
MCHC: 35.1 g/dL (ref 30.0–36.0)
MCV: 93.6 fL (ref 80.0–100.0)
Platelets: 273 K/uL (ref 150–400)
RBC: 4.2 MIL/uL — ABNORMAL LOW (ref 4.22–5.81)
RDW: 12.6 % (ref 11.5–15.5)
WBC: 10.2 K/uL (ref 4.0–10.5)
nRBC: 0 % (ref 0.0–0.2)

## 2024-09-26 LAB — HEPARIN LEVEL (UNFRACTIONATED)
Heparin Unfractionated: 0.22 [IU]/mL — ABNORMAL LOW (ref 0.30–0.70)
Heparin Unfractionated: 0.31 [IU]/mL (ref 0.30–0.70)

## 2024-09-26 LAB — ECHOCARDIOGRAM COMPLETE
Area-P 1/2: 3.27 cm2
Height: 65 in
S' Lateral: 3.9 cm
Weight: 3204.8 [oz_av]

## 2024-09-26 LAB — LIPID PANEL
Cholesterol: 169 mg/dL (ref 0–200)
HDL: 37 mg/dL — ABNORMAL LOW (ref >40)
LDL Cholesterol: 113 mg/dL — ABNORMAL HIGH (ref 0–99)
Total CHOL/HDL Ratio: 4.6 ratio
Triglycerides: 96 mg/dL (ref <150)
VLDL: 19 mg/dL (ref 0–40)

## 2024-09-26 LAB — BASIC METABOLIC PANEL WITH GFR
Anion gap: 13 (ref 5–15)
BUN: 9 mg/dL (ref 8–23)
CO2: 23 mmol/L (ref 22–32)
Calcium: 9.2 mg/dL (ref 8.9–10.3)
Chloride: 102 mmol/L (ref 98–111)
Creatinine, Ser: 1.02 mg/dL (ref 0.61–1.24)
GFR, Estimated: >60 mL/min (ref >60)
Glucose, Bld: 114 mg/dL — ABNORMAL HIGH (ref 70–99)
Potassium: 4.1 mmol/L (ref 3.5–5.1)
Sodium: 138 mmol/L (ref 135–145)

## 2024-09-26 LAB — HEPATIC FUNCTION PANEL
ALT: 23 U/L (ref 0–44)
AST: 114 U/L — ABNORMAL HIGH (ref 15–41)
Albumin: 3.3 g/dL — ABNORMAL LOW (ref 3.5–5.0)
Alkaline Phosphatase: 54 U/L (ref 38–126)
Bilirubin, Direct: <0.1 mg/dL (ref 0.0–0.2)
Total Bilirubin: 0.6 mg/dL (ref 0.0–1.2)
Total Protein: 6.5 g/dL (ref 6.5–8.1)

## 2024-09-26 LAB — LACTIC ACID, PLASMA
Lactic Acid, Venous: 1.1 mmol/L (ref 0.5–1.9)
Lactic Acid, Venous: 1.1 mmol/L (ref 0.5–1.9)

## 2024-09-26 LAB — TSH: TSH: 1.106 u[IU]/mL (ref 0.350–4.500)

## 2024-09-26 NOTE — Progress Notes (Signed)
 Dr. Gail up to see pt and performed bedside US  of heart.  Since pt remains pain free, will continue to monitor and f/u on Lactic Acid level that was ordered.  Pt's daughter updated on her Dad's BP and HR and that MD was at bedside for assessment.  She is requesting cath be done prior to Monday - she stated she will ask in the morning when she arrives to see pt. Glade Lee BSN RN CMSRN 09/26/2024, 1:18 AM

## 2024-09-26 NOTE — Progress Notes (Signed)
 Call received from [facility] that pt had an 8 beat run of Vtach.  Upon assessment of pt, he did report a little twinge in his left chest that quickly went away.  He remains chest pain free.  Morning EKG scanned into media.  Heparin  infusion continues.  Report given to oncoming RN.  Dr. Barbaraann made aware.  Pt is going to hold off eating breakfast in case they can do my cath today.    09/26/24 0722  Vitals  BP 95/68  MAP (mmHg) 77  BP Location Right Arm  BP Method Automatic  Patient Position (if appropriate) Lying  Pulse Rate (!) 50  Pulse Rate Source Monitor  ECG Heart Rate (!) 50  Resp 18  Level of Consciousness  Level of Consciousness Alert  MEWS COLOR  MEWS Score Color Yellow  Oxygen Therapy  SpO2 96 %  O2 Device Room Air  Pain Assessment  Pain Scale 0-10  Pain Score 0  POSS Scale (Pasero Opioid Sedation Scale)  POSS *See Group Information* 1-Acceptable,Awake and alert  MEWS Score  MEWS Temp 0  MEWS Systolic 1  MEWS Pulse 1  MEWS RR 0  MEWS LOC 0  MEWS Score 2  Note  Patient Observations pt reports little twinge of pain in left chest with assessment r/t 8 beat run of Vtach.  Symptoms have resolved.   Glade Lee BSN RN CMSRN 09/26/2024, 7:40 AM

## 2024-09-26 NOTE — Plan of Care (Signed)

## 2024-09-26 NOTE — Progress Notes (Signed)
 PHARMACY - ANTICOAGULATION CONSULT NOTE  Pharmacy Consult for heparin   Indication: chest pain/ACS  Allergies  Allergen Reactions   Atorvastatin  Other (See Comments)    Myalgias   Rosuvastatin  Other (See Comments)    Myalgias    Patient Measurements: Height: 5' 5 (165.1 cm) Weight: 90.9 kg (200 lb 4.8 oz) IBW/kg (Calculated) : 61.5 HEPARIN  DW (KG): 81.1  Vital Signs: Temp: 98.3 F (36.8 C) (11/01 0730) Temp Source: Oral (11/01 0730) BP: 95/68 (11/01 0722) Pulse Rate: 50 (11/01 0722)  Labs: Recent Labs    09/25/24 1301 09/25/24 1816 09/25/24 2014 09/25/24 2057 09/25/24 2236 09/26/24 0647  HGB 14.8  --   --   --   --  13.8  HCT 43.0  --   --   --   --  39.3  PLT 280  --   --   --   --  273  HEPARINUNFRC  --   --   --  0.10*  --  0.22*  CREATININE 1.03  --   --   --   --  1.02  TROPONINIHS  --  2,295* 5,168*  --  9,375*  --     Estimated Creatinine Clearance: 77.9 mL/min (by C-G formula based on SCr of 1.02 mg/dL).   Medical History: Past Medical History:  Diagnosis Date   Angina pectoris 06/04/2019   Arthritis    Benign essential HTN 02/10/2016   Bronchospasm with bronchitis, acute 12/14/2013   Coronary artery disease status post PTCA and drug-eluting stent to circumflex artery in July 2020 06/26/2019   Dyslipidemia 09/24/2020   GAD (generalized anxiety disorder) 12/14/2013   GERD (gastroesophageal reflux disease)    occ   History of kidney stones    Hypertension 12/14/2013   Hypogonadism male 12/14/2013   Kidney stones 02/10/2016   Myelopathy (HCC) 06/03/2014   Obesity    OSA (obstructive sleep apnea) 02/10/2016   Shortness of breath    occ-allergies   Sleep apnea    cpap 16 yrs      Assessment: 62 yom presented to the ED with NSTEMI and angina. No anticoagulation prior to admission. Pharmacy consulted for heparin .    Heparin  level 0.22 is subtherapeutic on 1250 units/hr.  No issues with infusion or bleeding per RN. Planning cath 11/3.    Goal of  Therapy:  Heparin  level 0.3-0.7 units/ml Monitor platelets by anticoagulation protocol: Yes   Plan:  -Increase heparin  to 1450 units/hr -Heparin  level in 6 hours and daily wth CBC daily  Prentice Poisson, PharmD Clinical Pharmacist **Pharmacist phone directory can now be found on [email or web address] ([email or web address]).  Listed under [facility].

## 2024-09-26 NOTE — Progress Notes (Signed)
   09/26/24 2318  BiPAP/CPAP/SIPAP  BiPAP/CPAP/SIPAP Pt Type Adult  BiPAP/CPAP/SIPAP Resmed  Mask Type Nasal mask  PEEP 14 cmH20  FiO2 (%) 21 %  Patient Home Machine Yes  Safety Check Completed by RT for Home Unit Yes, no issues noted  Patient Home Mask Yes  Patient Home Tubing Yes  Device Plugged into RED Power Outlet Yes  BiPAP/CPAP /SiPAP Vitals  Pulse Rate 61  Resp 17  SpO2 95 %  Bilateral Breath Sounds Clear

## 2024-09-26 NOTE — Progress Notes (Signed)
 Call made to Dr. Gail regarding pt's BP and elevated Troponin levels.  He will discuss with IR and come see him.  Pt received Toprol  XL 25mg  at 1941 and Ranexa  1gm at 2108.   09/25/24 2344 09/25/24 2346  Vitals  BP (!) 88/59 (!) 86/64  MAP (mmHg) 70 72  BP Location Left Arm Right Arm  BP Method Automatic Automatic  Patient Position (if appropriate) Lying Lying  Pulse Rate (!) 52 (!) 51  Pulse Rate Source Monitor Monitor  ECG Heart Rate (!) 52 (!) 51  Resp 16 16  Level of Consciousness  Level of Consciousness Alert Alert  MEWS COLOR  MEWS Score Color Green Green  Oxygen Therapy  SpO2 95 % 96 %  O2 Device Room Air;CPAP Room Air;CPAP  Pain Assessment  Pain Scale 0-10  --   Pain Score 0  --   MEWS Score  MEWS Temp 0 0  MEWS Systolic 1 1  MEWS Pulse 0 0  MEWS RR 0 0  MEWS LOC 0 0  MEWS Score 1 1  Note  Patient Observations Pt resting calmly with CPAP on, eyes closed, awakened to verbal stimuli easily.  Specifically denies any chest pain.  --     Glade Lee BSN RN CMSRN 09/26/2024, 12:08 AM

## 2024-09-26 NOTE — Progress Notes (Signed)
  Echocardiogram 2D Echocardiogram has been performed.  Corey [name] 09/26/2024, 12:21 PM

## 2024-09-26 NOTE — Progress Notes (Signed)
  Progress Note  Patient Name: Corey [name] Date of Encounter: 09/26/2024 [facility] Cardiologist: Corey Fitch, MD   Interval Summary   Feeling well.  No current chest pain.  Vital Signs Vitals:   09/26/24 0316 09/26/24 0525 09/26/24 0722 09/26/24 0730  BP: 96/65  95/68   Pulse: (!) 54  (!) 50   Resp: 18  18   Temp: 98 F (36.7 C)   98.3 F (36.8 C)  TempSrc: Oral   Oral  SpO2: 93%  96%   Weight:  90.9 kg    Height:        Intake/Output Summary (Last 24 hours) at 09/26/2024 1047 Last data filed at 09/26/2024 0900 Gross per 24 hour  Intake 717.43 ml  Output --  Net 717.43 ml      09/26/2024    5:25 AM 09/25/2024    5:20 PM 09/25/2024   12:44 PM  Last 3 Weights  Weight (lbs) 200 lb 4.8 oz 200 lb 12.8 oz 208 lb  Weight (kg) 90.855 kg 91.082 kg 94.348 kg      Telemetry/ECG  Sinus rhythm, short run of nonsustained VT- Personally Reviewed  Physical Exam  GEN: No acute distress.   Neck: No JVD Cardiac: RRR, no murmurs, rubs, or gallops.  Respiratory: Clear to auscultation bilaterally. GI: Soft, nontender, non-distended  MS: No edema  Assessment & Plan   1.  Non-STEMI: Significant troponin elevation.  He is on IV heparin .  He is no longer having chest discomfort.  Corey [name] plan for transthoracic echo today.  Corey [name] continue IV heparin  throughout the weekend.  Plan for left heart catheterization Monday.  2.  Hypertension: Continue losartan  and metoprolol .  3.  Hyperlipidemia: Continue Zetia .  May need PCSK9 inhibitor as an outpatient.  4.  Obstructive sleep apnea: Continue CPAP    For questions or updates, please contact [facility] Please consult [email or web address] for contact info under         Signed, Corey [name] Corey Norton, MD

## 2024-09-26 NOTE — Progress Notes (Signed)
 PHARMACY - ANTICOAGULATION CONSULT NOTE  Pharmacy Consult for heparin   Indication: chest pain/ACS  Allergies  Allergen Reactions   Atorvastatin  Other (See Comments)    Myalgias   Rosuvastatin  Other (See Comments)    Myalgias    Patient Measurements: Height: 5' 5 (165.1 cm) Weight: 90.9 kg (200 lb 4.8 oz) IBW/kg (Calculated) : 61.5 HEPARIN  DW (KG): 81.1  Vital Signs: Temp: 99 F (37.2 C) (11/01 1400) Temp Source: Oral (11/01 1400) BP: 94/66 (11/01 1400) Pulse Rate: 57 (11/01 1400)  Labs: Recent Labs    09/25/24 1301 09/25/24 1816 09/25/24 2014 09/25/24 2057 09/25/24 2236 09/26/24 0647 09/26/24 1606  HGB 14.8  --   --   --   --  13.8  --   HCT 43.0  --   --   --   --  39.3  --   PLT 280  --   --   --   --  273  --   HEPARINUNFRC  --   --   --  0.10*  --  0.22* 0.31  CREATININE 1.03  --   --   --   --  1.02  --   TROPONINIHS  --  2,295* 5,168*  --  9,375*  --   --     Estimated Creatinine Clearance: 77.9 mL/min (by C-G formula based on SCr of 1.02 mg/dL).   Medical History: Past Medical History:  Diagnosis Date   Angina pectoris 06/04/2019   Arthritis    Benign essential HTN 02/10/2016   Bronchospasm with bronchitis, acute 12/14/2013   Coronary artery disease status post PTCA and drug-eluting stent to circumflex artery in July 2020 06/26/2019   Dyslipidemia 09/24/2020   GAD (generalized anxiety disorder) 12/14/2013   GERD (gastroesophageal reflux disease)    occ   History of kidney stones    Hypertension 12/14/2013   Hypogonadism male 12/14/2013   Kidney stones 02/10/2016   Myelopathy (HCC) 06/03/2014   Obesity    OSA (obstructive sleep apnea) 02/10/2016   Shortness of breath    occ-allergies   Sleep apnea    cpap 16 yrs      Assessment: 62 yom presented to the ED with NSTEMI and angina. No anticoagulation prior to admission. Pharmacy consulted for heparin .    11/1 PM Heparin  level 0.31 is therapeutic. Continue current infusion.   Goal of Therapy:   Heparin  level 0.3-0.7 units/ml Monitor platelets by anticoagulation protocol: Yes   Plan:  Continue heparin  infusion at 1450 units/hr. -Heparin  level in AM and daily wth CBC daily  Larraine Brazier, PharmD Clinical Pharmacist 09/26/2024  5:06 PM **Pharmacist phone directory can now be found on [email or web address] ([email or web address]).  Listed under [facility].

## 2024-09-27 DIAGNOSIS — I209 Angina pectoris, unspecified: Secondary | ICD-10-CM | POA: Diagnosis not present

## 2024-09-27 LAB — CBC
HCT: 42.7 % (ref 39.0–52.0)
Hemoglobin: 14.6 g/dL (ref 13.0–17.0)
MCH: 32.5 pg (ref 26.0–34.0)
MCHC: 34.2 g/dL (ref 30.0–36.0)
MCV: 95.1 fL (ref 80.0–100.0)
Platelets: 272 K/uL (ref 150–400)
RBC: 4.49 MIL/uL (ref 4.22–5.81)
RDW: 12.8 % (ref 11.5–15.5)
WBC: 11.8 K/uL — ABNORMAL HIGH (ref 4.0–10.5)
nRBC: 0 % (ref 0.0–0.2)

## 2024-09-27 LAB — HEPARIN LEVEL (UNFRACTIONATED)
Heparin Unfractionated: 0.19 [IU]/mL — ABNORMAL LOW (ref 0.30–0.70)
Heparin Unfractionated: 0.24 [IU]/mL — ABNORMAL LOW (ref 0.30–0.70)

## 2024-09-27 LAB — HEMOGLOBIN A1C
Hgb A1c MFr Bld: 5.1 % (ref 4.8–5.6)
Mean Plasma Glucose: 99.67 mg/dL

## 2024-09-27 MED ORDER — HEPARIN BOLUS VIA INFUSION
1000.0000 [IU] | Freq: Once | INTRAVENOUS | Status: AC
  Administered 2024-09-27: 1000 [IU] via INTRAVENOUS
  Filled 2024-09-27: qty 1000

## 2024-09-27 NOTE — Progress Notes (Signed)
  Progress Note  Patient Name: Corey [name] Date of Encounter: 09/27/2024 [facility] Cardiologist: [name], MD   Interval Summary   Feeling well without acute complaint.  Echo yesterday showed a normal ejection fraction without wall motion abnormality.  Vital Signs Vitals:   09/26/24 2317 09/26/24 2318 09/27/24 0451 09/27/24 0710  BP: 93/62  99/64 98/64  Pulse: 61 61 (!) 55 60  Resp: 16 17 19 20   Temp: 98.1 F (36.7 C)  97.9 F (36.6 C) 98.1 F (36.7 C)  TempSrc: Oral  Oral Oral  SpO2: 95% 95% 96% 100%  Weight:   91.5 kg   Height:        Intake/Output Summary (Last 24 hours) at 09/27/2024 0837 Last data filed at 09/26/2024 1300 Gross per 24 hour  Intake 480 ml  Output --  Net 480 ml      09/27/2024    4:51 AM 09/26/2024    5:25 AM 09/25/2024    5:20 PM  Last 3 Weights  Weight (lbs) 201 lb 11.5 oz 200 lb 4.8 oz 200 lb 12.8 oz  Weight (kg) 91.5 kg 90.855 kg 91.082 kg      Telemetry/ECG  Sinus rhythm- Personally Reviewed  Physical Exam  GEN: No acute distress.   Neck: No JVD Cardiac: RRR, no murmurs, rubs, or gallops.  Respiratory: Clear to auscultation bilaterally. GI: Soft, nontender, non-distended  MS: No edema  Assessment & Plan   1.  Non-STEMI: Significantly elevated troponin.  On IV heparin .  No longer having chest discomfort.  Corey [name] plan for left heart catheterization tomorrow.  Risks and benefits have been discussed.  The patient understands the risks and has agreed to the procedure.  The patient understands that risks include but are not limited to stroke (1 in 1000), death (1 in 1000), kidney failure [usually temporary] (1 in 500), bleeding (1 in 200), allergic reaction [possibly serious] (1 in 200), and agrees to proceed.  2.  Hypertension: Continue losartan  and metoprolol   3.  Hyperlipidemia: On Zetia .  Intolerant to statins.  Corey [name] likely need PCSK9 inhibitor as outpatient  4.  Obstructive sleep apnea: Continue CPAP      For questions or updates, please contact [facility] Please consult [email or web address] for contact info under         Signed, [name], MD

## 2024-09-27 NOTE — Progress Notes (Signed)
 PHARMACY - ANTICOAGULATION CONSULT NOTE  Pharmacy Consult for heparin   Indication: chest pain/ACS  Allergies  Allergen Reactions   Atorvastatin  Other (See Comments)    Myalgias   Rosuvastatin  Other (See Comments)    Myalgias    Patient Measurements: Height: 5' 5 (165.1 cm) Weight: 91.5 kg (201 lb 11.5 oz) IBW/kg (Calculated) : 61.5 HEPARIN  DW (KG): 81.1  Vital Signs: Temp: 98.2 F (36.8 C) (11/02 1520) Temp Source: Oral (11/02 1520) BP: 85/61 (11/02 1520) Pulse Rate: 54 (11/02 1520)  Labs: Recent Labs    09/25/24 1301 09/25/24 1816 09/25/24 2014 09/25/24 2057 09/25/24 2236 09/26/24 0647 09/26/24 1606 09/27/24 0757 09/27/24 1547  HGB 14.8  --   --   --   --  13.8  --  14.6  --   HCT 43.0  --   --   --   --  39.3  --  42.7  --   PLT 280  --   --   --   --  273  --  272  --   HEPARINUNFRC  --   --   --    < >  --  0.22* 0.31 0.19* 0.24*  CREATININE 1.03  --   --   --   --  1.02  --   --   --   TROPONINIHS  --  2,295* 5,168*  --  9,375*  --   --   --   --    < > = values in this interval not displayed.    Estimated Creatinine Clearance: 78.1 mL/min (by C-G formula based on SCr of 1.02 mg/dL).   Medical History: Past Medical History:  Diagnosis Date   Angina pectoris 06/04/2019   Arthritis    Benign essential HTN 02/10/2016   Bronchospasm with bronchitis, acute 12/14/2013   Coronary artery disease status post PTCA and drug-eluting stent to circumflex artery in July 2020 06/26/2019   Dyslipidemia 09/24/2020   GAD (generalized anxiety disorder) 12/14/2013   GERD (gastroesophageal reflux disease)    occ   History of kidney stones    Hypertension 12/14/2013   Hypogonadism male 12/14/2013   Kidney stones 02/10/2016   Myelopathy (HCC) 06/03/2014   Obesity    OSA (obstructive sleep apnea) 02/10/2016   Shortness of breath    occ-allergies   Sleep apnea    cpap 16 yrs      Assessment: 62 yom presented to the ED with NSTEMI and angina. No anticoagulation prior to  admission. Pharmacy consulted for heparin .    11/2 PM: Heparin  level 0.24 on 1650 units/hr, will increase. No infusion issues or bleeding noted. -plans noted for cath on 11/3  Goal of Therapy:  Heparin  level 0.3-0.7 units/ml Monitor platelets by anticoagulation protocol: Yes   Plan:  Increase Heparin  to 1800 units/hr. -Heparin  level in 6 hours and daily wth CBC daily  Larraine Brazier, PharmD Clinical Pharmacist 09/27/2024  4:54 PM **Pharmacist phone directory can now be found on [email or web address] ([email or web address]).  Listed under [facility].

## 2024-09-27 NOTE — H&P (View-Only) (Signed)
  Progress Note  Patient Name: RACER QUAM Date of Encounter: 09/27/2024 [facility] Cardiologist: [name], MD   Interval Summary   Feeling well without acute complaint.  Echo yesterday showed a normal ejection fraction without wall motion abnormality.  Vital Signs Vitals:   09/26/24 2317 09/26/24 2318 09/27/24 0451 09/27/24 0710  BP: 93/62  99/64 98/64  Pulse: 61 61 (!) 55 60  Resp: 16 17 19 20   Temp: 98.1 F (36.7 C)  97.9 F (36.6 C) 98.1 F (36.7 C)  TempSrc: Oral  Oral Oral  SpO2: 95% 95% 96% 100%  Weight:   91.5 kg   Height:        Intake/Output Summary (Last 24 hours) at 09/27/2024 0837 Last data filed at 09/26/2024 1300 Gross per 24 hour  Intake 480 ml  Output --  Net 480 ml      09/27/2024    4:51 AM 09/26/2024    5:25 AM 09/25/2024    5:20 PM  Last 3 Weights  Weight (lbs) 201 lb 11.5 oz 200 lb 4.8 oz 200 lb 12.8 oz  Weight (kg) 91.5 kg 90.855 kg 91.082 kg      Telemetry/ECG  Sinus rhythm- Personally Reviewed  Physical Exam  GEN: No acute distress.   Neck: No JVD Cardiac: RRR, no murmurs, rubs, or gallops.  Respiratory: Clear to auscultation bilaterally. GI: Soft, nontender, non-distended  MS: No edema  Assessment & Plan   1.  Non-STEMI: Significantly elevated troponin.  On IV heparin .  No longer having chest discomfort.  [name] plan for left heart catheterization tomorrow.  Risks and benefits have been discussed.  The patient understands the risks and has agreed to the procedure.  The patient understands that risks include but are not limited to stroke (1 in 1000), death (1 in 1000), kidney failure [usually temporary] (1 in 500), bleeding (1 in 200), allergic reaction [possibly serious] (1 in 200), and agrees to proceed.  2.  Hypertension: Continue losartan  and metoprolol   3.  Hyperlipidemia: On Zetia .  Intolerant to statins.  [name] likely need PCSK9 inhibitor as outpatient  4.  Obstructive sleep apnea: Continue CPAP      For questions or updates, please contact [facility] Please consult [email or web address] for contact info under         Signed, [name], MD

## 2024-09-27 NOTE — Plan of Care (Signed)

## 2024-09-27 NOTE — Interval H&P Note (Signed)
 History and Physical Interval Note:  09/27/2024 9:47 AM  [name]  has presented today for surgery, with the diagnosis of nstemi.  The various methods of treatment have been discussed with the patient and family. After consideration of risks, benefits and other options for treatment, the patient has consented to  Procedure(s): LEFT HEART CATH AND CORONARY ANGIOGRAPHY (N/A) as a surgical intervention.  The patient's history has been reviewed, patient examined, no change in status, stable for surgery.  I have reviewed the patient's chart and labs.  Questions were answered to the patient's satisfaction.     Abbigail Anstey K Lyn Deemer

## 2024-09-27 NOTE — Progress Notes (Signed)
 PHARMACY - ANTICOAGULATION CONSULT NOTE  Pharmacy Consult for heparin   Indication: chest pain/ACS  Allergies  Allergen Reactions   Atorvastatin  Other (See Comments)    Myalgias   Rosuvastatin  Other (See Comments)    Myalgias    Patient Measurements: Height: 5' 5 (165.1 cm) Weight: 91.5 kg (201 lb 11.5 oz) IBW/kg (Calculated) : 61.5 HEPARIN  DW (KG): 81.1  Vital Signs: Temp: 98.1 F (36.7 C) (11/02 0710) Temp Source: Oral (11/02 0710) BP: 98/64 (11/02 0710) Pulse Rate: 60 (11/02 0710)  Labs: Recent Labs    09/25/24 1301 09/25/24 1816 09/25/24 2014 09/25/24 2057 09/25/24 2236 09/26/24 0647 09/26/24 1606 09/27/24 0757  HGB 14.8  --   --   --   --  13.8  --  14.6  HCT 43.0  --   --   --   --  39.3  --  42.7  PLT 280  --   --   --   --  273  --  272  HEPARINUNFRC  --   --   --    < >  --  0.22* 0.31 0.19*  CREATININE 1.03  --   --   --   --  1.02  --   --   TROPONINIHS  --  2,295* 5,168*  --  9,375*  --   --   --    < > = values in this interval not displayed.    Estimated Creatinine Clearance: 78.1 mL/min (by C-G formula based on SCr of 1.02 mg/dL).   Medical History: Past Medical History:  Diagnosis Date   Angina pectoris 06/04/2019   Arthritis    Benign essential HTN 02/10/2016   Bronchospasm with bronchitis, acute 12/14/2013   Coronary artery disease status post PTCA and drug-eluting stent to circumflex artery in July 2020 06/26/2019   Dyslipidemia 09/24/2020   GAD (generalized anxiety disorder) 12/14/2013   GERD (gastroesophageal reflux disease)    occ   History of kidney stones    Hypertension 12/14/2013   Hypogonadism male 12/14/2013   Kidney stones 02/10/2016   Myelopathy (HCC) 06/03/2014   Obesity    OSA (obstructive sleep apnea) 02/10/2016   Shortness of breath    occ-allergies   Sleep apnea    cpap 16 yrs      Assessment: 62 yom presented to the ED with NSTEMI and angina. No anticoagulation prior to admission. Pharmacy consulted for heparin .     -heparin  level down to 0.19 this morning on heparin  1450 units/hr -plans noted for cath on 11/3  Goal of Therapy:  Heparin  level 0.3-0.7 units/ml Monitor platelets by anticoagulation protocol: Yes   Plan:  -heparin  1000 unit bolus then increase to 1650 units/hr -Heparin  level in 6 hours and daily wth CBC daily  Prentice Poisson, PharmD Clinical Pharmacist **Pharmacist phone directory can now be found on [email or web address] ([email or web address]).  Listed under [facility].

## 2024-09-28 ENCOUNTER — Encounter (HOSPITAL_COMMUNITY)
Admission: EM | Disposition: A | Discharge status: Home / Self Care | Payer: Self-pay | Source: Home / Self Care | Attending: Cardiovascular Disease

## 2024-09-28 DIAGNOSIS — I214 Non-ST elevation (NSTEMI) myocardial infarction: Secondary | ICD-10-CM

## 2024-09-28 DIAGNOSIS — I2510 Atherosclerotic heart disease of native coronary artery without angina pectoris: Secondary | ICD-10-CM

## 2024-09-28 DIAGNOSIS — E785 Hyperlipidemia, unspecified: Secondary | ICD-10-CM | POA: Diagnosis not present

## 2024-09-28 DIAGNOSIS — I10 Essential (primary) hypertension: Secondary | ICD-10-CM | POA: Diagnosis not present

## 2024-09-28 HISTORY — PX: CORONARY IMAGING/OCT: CATH118326

## 2024-09-28 HISTORY — PX: LEFT HEART CATH AND CORONARY ANGIOGRAPHY: CATH118249

## 2024-09-28 HISTORY — PX: CORONARY STENT INTERVENTION: CATH118234

## 2024-09-28 LAB — HEPARIN LEVEL (UNFRACTIONATED)
Heparin Unfractionated: 0.42 [IU]/mL (ref 0.30–0.70)
Heparin Unfractionated: 0.42 [IU]/mL (ref 0.30–0.70)

## 2024-09-28 LAB — POCT ACTIVATED CLOTTING TIME
Activated Clotting Time: 279 s
Activated Clotting Time: 331 s
Activated Clotting Time: 366 s

## 2024-09-28 LAB — CBC
HCT: 39.3 % (ref 39.0–52.0)
Hemoglobin: 13.3 g/dL (ref 13.0–17.0)
MCH: 32.5 pg (ref 26.0–34.0)
MCHC: 33.8 g/dL (ref 30.0–36.0)
MCV: 96.1 fL (ref 80.0–100.0)
Platelets: 248 K/uL (ref 150–400)
RBC: 4.09 MIL/uL — ABNORMAL LOW (ref 4.22–5.81)
RDW: 12.6 % (ref 11.5–15.5)
WBC: 11.9 K/uL — ABNORMAL HIGH (ref 4.0–10.5)
nRBC: 0 % (ref 0.0–0.2)

## 2024-09-28 MED ORDER — MORPHINE SULFATE (PF) 2 MG/ML IV SOLN: 1.0000 mg | INTRAVENOUS | Status: DC | PRN

## 2024-09-28 MED ORDER — LABETALOL HCL 5 MG/ML IV SOLN: 10.0000 mg | INTRAVENOUS | Status: AC | PRN

## 2024-09-28 MED ORDER — VERAPAMIL HCL 2.5 MG/ML IV SOLN
INTRAVENOUS | Status: DC | PRN
  Administered 2024-09-28: 10 mL via INTRA_ARTERIAL

## 2024-09-28 MED ORDER — FENTANYL CITRATE (PF) 100 MCG/2ML IJ SOLN
INTRAMUSCULAR | Status: AC
  Filled 2024-09-28: qty 2

## 2024-09-28 MED ORDER — SODIUM CHLORIDE 0.9 % IV SOLN
250.0000 mL | INTRAVENOUS | Status: DC | PRN
Start: 2024-09-28 — End: 2024-09-29

## 2024-09-28 MED ORDER — TICAGRELOR 90 MG PO TABS
ORAL_TABLET | ORAL | Status: DC | PRN
  Administered 2024-09-28: 180 mg via ORAL

## 2024-09-28 MED ORDER — MIDAZOLAM HCL 2 MG/2ML IJ SOLN
INTRAMUSCULAR | Status: AC
Start: 2024-09-28 — End: 2024-09-28
  Filled 2024-09-28: qty 2

## 2024-09-28 MED ORDER — SODIUM CHLORIDE 0.9% FLUSH: 3.0000 mL | INTRAVENOUS | Status: DC | PRN

## 2024-09-28 MED ORDER — HEPARIN (PORCINE) IN NACL 1000-0.9 UT/500ML-% IV SOLN
INTRAVENOUS | Status: DC | PRN
  Administered 2024-09-28: 1000 mL

## 2024-09-28 MED ORDER — HYDRALAZINE HCL 20 MG/ML IJ SOLN: 10.0000 mg | INTRAMUSCULAR | Status: AC | PRN

## 2024-09-28 MED ORDER — IOHEXOL 350 MG/ML SOLN
INTRAVENOUS | Status: DC | PRN
  Administered 2024-09-28: 175 mL

## 2024-09-28 MED ORDER — FENTANYL CITRATE (PF) 100 MCG/2ML IJ SOLN
INTRAMUSCULAR | Status: DC | PRN
  Administered 2024-09-28: 25 ug via INTRAVENOUS

## 2024-09-28 MED ORDER — HEPARIN SODIUM (PORCINE) 1000 UNIT/ML IJ SOLN
INTRAMUSCULAR | Status: AC
  Filled 2024-09-28: qty 10

## 2024-09-28 MED ORDER — LIDOCAINE HCL (PF) 1 % IJ SOLN
INTRAMUSCULAR | Status: AC
  Filled 2024-09-28: qty 30

## 2024-09-28 MED ORDER — NITROGLYCERIN 1 MG/10 ML FOR IR/CATH LAB
INTRA_ARTERIAL | Status: DC | PRN
  Administered 2024-09-28: 100 ug via INTRACORONARY

## 2024-09-28 MED ORDER — TICAGRELOR 90 MG PO TABS
90.0000 mg | ORAL_TABLET | Freq: Two times a day (BID) | ORAL | Status: DC
  Administered 2024-09-28 – 2024-09-29 (×2): 90 mg via ORAL
  Filled 2024-09-28 (×2): qty 1

## 2024-09-28 MED ORDER — HEPARIN SODIUM (PORCINE) 1000 UNIT/ML IJ SOLN
INTRAMUSCULAR | Status: DC | PRN
  Administered 2024-09-28: 5000 [IU] via INTRAVENOUS
  Administered 2024-09-28: 1000 [IU] via INTRAVENOUS
  Administered 2024-09-28: 5000 [IU] via INTRA_ARTERIAL

## 2024-09-28 MED ORDER — VERAPAMIL HCL 2.5 MG/ML IV SOLN
INTRAVENOUS | Status: AC
  Filled 2024-09-28: qty 2

## 2024-09-28 MED ORDER — SODIUM CHLORIDE 0.9% FLUSH
3.0000 mL | Freq: Two times a day (BID) | INTRAVENOUS | Status: DC
  Administered 2024-09-28 – 2024-09-29 (×2): 3 mL via INTRAVENOUS

## 2024-09-28 MED ORDER — NITROGLYCERIN 1 MG/10 ML FOR IR/CATH LAB
INTRA_ARTERIAL | Status: AC
  Filled 2024-09-28: qty 10

## 2024-09-28 MED ORDER — LIDOCAINE HCL (PF) 1 % IJ SOLN
INTRAMUSCULAR | Status: DC | PRN
  Administered 2024-09-28: 2 mL

## 2024-09-28 MED ORDER — ASPIRIN 81 MG PO CHEW
81.0000 mg | CHEWABLE_TABLET | Freq: Every day | ORAL | Status: DC
Start: 2024-09-29 — End: 2024-09-29

## 2024-09-28 MED ORDER — TICAGRELOR 90 MG PO TABS
ORAL_TABLET | ORAL | Status: AC
  Filled 2024-09-28: qty 2

## 2024-09-28 SURGICAL SUPPLY — 21 items
BALL DC AGENT 3.0X15 (BALLOONS) IMPLANT
BALLOON EMERGE MR 2.0X12 (BALLOONS) IMPLANT
BALLOON EMERGE MR 2.5X12 (BALLOONS) IMPLANT
BALLOON TAKERU 1.5X6 (BALLOONS) IMPLANT
BALLOON ~~LOC~~ EMERGE MR 3.25X20 (BALLOONS) IMPLANT
CATH DRAGONFLY OPSTAR (CATHETERS) IMPLANT
CATH INFINITI 5FR ANG PIGTAIL (CATHETERS) IMPLANT
CATH INFINITI AMBI 6FR TG (CATHETERS) IMPLANT
CATH LAUNCHER 6FR EBU3.5 (CATHETERS) IMPLANT
DEVICE RAD COMP TR BAND LRG (VASCULAR PRODUCTS) IMPLANT
GLIDESHEATH SLEND SS 6F .021 (SHEATH) IMPLANT
GUIDEWIRE VAS SION BLUE 190 (WIRE) IMPLANT
KIT ENCORE 26 ADVANTAGE (KITS) IMPLANT
KIT HEMO VALVE WATCHDOG (MISCELLANEOUS) IMPLANT
PACK CARDIAC CATHETERIZATION (CUSTOM PROCEDURE TRAY) ×1 IMPLANT
SET ATX-X65L (MISCELLANEOUS) IMPLANT
STENT SYNERGY XD 3.0X28 (Permanent Stent) IMPLANT
WIRE ASAHI PROWATER 180CM (WIRE) IMPLANT
WIRE EMERALD 3MM-J .025X260CM (WIRE) IMPLANT
WIRE EMERALD 3MM-J .035X150CM (WIRE) IMPLANT
WIRE EMERALD 3MM-J .035X260CM (WIRE) IMPLANT

## 2024-09-28 NOTE — Progress Notes (Signed)
 PHARMACY - ANTICOAGULATION CONSULT NOTE  Pharmacy Consult for heparin   Indication: chest pain/ACS  Allergies  Allergen Reactions   Atorvastatin  Other (See Comments)    Myalgias   Rosuvastatin  Other (See Comments)    Myalgias    Patient Measurements: Height: 5' 5 (165.1 cm) Weight: 91.5 kg (201 lb 11.5 oz) IBW/kg (Calculated) : 61.5 HEPARIN  DW (KG): 81.1  Vital Signs: Temp: 97.6 F (36.4 C) (11/02 2328) Temp Source: Oral (11/02 2328) BP: 93/74 (11/02 2328) Pulse Rate: 59 (11/02 2328)  Labs: Recent Labs    09/25/24 1301 09/25/24 1816 09/25/24 2014 09/25/24 2057 09/25/24 2236 09/26/24 0647 09/26/24 1606 09/27/24 0757 09/27/24 1547 09/27/24 2350  HGB 14.8  --   --   --   --  13.8  --  14.6  --   --   HCT 43.0  --   --   --   --  39.3  --  42.7  --   --   PLT 280  --   --   --   --  273  --  272  --   --   HEPARINUNFRC  --   --   --    < >  --  0.22*   < > 0.19* 0.24* 0.42  CREATININE 1.03  --   --   --   --  1.02  --   --   --   --   TROPONINIHS  --  2,295* 5,168*  --  9,375*  --   --   --   --   --    < > = values in this interval not displayed.    Estimated Creatinine Clearance: 78.1 mL/min (by C-G formula based on SCr of 1.02 mg/dL).   Medical History: Past Medical History:  Diagnosis Date   Angina pectoris 06/04/2019   Arthritis    Benign essential HTN 02/10/2016   Bronchospasm with bronchitis, acute 12/14/2013   Coronary artery disease status post PTCA and drug-eluting stent to circumflex artery in July 2020 06/26/2019   Dyslipidemia 09/24/2020   GAD (generalized anxiety disorder) 12/14/2013   GERD (gastroesophageal reflux disease)    occ   History of kidney stones    Hypertension 12/14/2013   Hypogonadism male 12/14/2013   Kidney stones 02/10/2016   Myelopathy (HCC) 06/03/2014   Obesity    OSA (obstructive sleep apnea) 02/10/2016   Shortness of breath    occ-allergies   Sleep apnea    cpap 16 yrs      Assessment: 62 yom presented to the ED with  NSTEMI and angina. No anticoagulation prior to admission. Pharmacy consulted for heparin .    11/2 PM: Heparin  level 0.24 on 1650 units/hr, will increase. No infusion issues or bleeding noted. -plans noted for cath on 11/3  11/3 AM update:  Heparin  level therapeutic   Goal of Therapy:  Heparin  level 0.3-0.7 units/ml Monitor platelets by anticoagulation protocol: Yes   Plan:  Cont heparin  at 1800 units/hr Heparin  level with AM labs  Lynwood Mckusick, PharmD, BCPS Clinical Pharmacist Phone: [phone]

## 2024-09-28 NOTE — Progress Notes (Signed)
 On call Dr. Gail MD paged re: BP still running low, pt has no complaints at this time  No events overnight

## 2024-09-28 NOTE — Progress Notes (Addendum)
 Progress Note  Patient Name: Corey [name] Date of Encounter: 09/28/2024 [facility] Cardiologist: [name], MD   Interval Summary    No further episodes of chest pain.   Vital Signs Vitals:   09/27/24 2328 09/28/24 0419 09/28/24 0519 09/28/24 0851  BP: 93/74 100/72  92/63  Pulse: (!) 59 (!) 58  (!) 58  Resp: 19 17  15   Temp: 97.6 F (36.4 C) 97.8 F (36.6 C)  98.4 F (36.9 C)  TempSrc: Oral Oral  Oral  SpO2: 97% 96%  96%  Weight:   92.5 kg   Height:        Intake/Output Summary (Last 24 hours) at 09/28/2024 0938 Last data filed at 09/28/2024 0422 Gross per 24 hour  Intake 740 ml  Output --  Net 740 ml      09/28/2024    5:19 AM 09/27/2024    4:51 AM 09/26/2024    5:25 AM  Last 3 Weights  Weight (lbs) 203 lb 14.8 oz 201 lb 11.5 oz 200 lb 4.8 oz  Weight (kg) 92.5 kg 91.5 kg 90.855 kg      Telemetry/ECG   Sinus Rhythm 50s - Personally Reviewed  Physical Exam  GEN: No acute distress.   Neck: No JVD Cardiac: RRR, no murmurs, rubs, or gallops.  Respiratory: Clear to auscultation bilaterally. GI: Soft, nontender, non-distended  MS: No edema  Assessment & Plan   63 y.o. male with CAD s/p PTCA/stent to Cx in 2020 with known occluded RCA, dyslipidemia, HTN, arthritis, polyarthralgia followed by rheumatology, kidney stones, OSA on CPAP, generalized anxiety disorder, former tobacco use who was seen 09/25/2024 for the evaluation of chest pain.   NSTEMI -- cath 2020 with stenting of the LCx and known CTO of the RCA -- presented with chest pain and hsTn up to 9375, EKG with new TWI in inferior leads -- echo 11/1 with LVEF of 60-65%, no rWMA, g1DD, normal RV -- continue IV heparin , ASA, zetia , ranexa  -- planned for cardiac cath today  HTN -- BP soft this morning, planned for cath. Says no issues with low BPs at home.  -- hold losartan , continue Toprol  XL 25mg  daily. May need to reduce losartan    HLD -- hx of statin intolerance, on Zetia   --  plans for lipid clinic outpatient   OSA on Cpap   Medical Readiness Date: 09/29/2024    For questions or updates, please contact [facility] Please consult [email or web address] for contact info under   Signed, [name] Rummer, NP   Patient seen, examined. Available data reviewed. Agree with findings, assessment, and plan as outlined by [name] Rummer, NP.  The patient is independently interviewed and examined.  He is alert, oriented, in no distress.  HEENT is normal, JVP is normal, lungs are clear bilaterally, heart is regular rate and rhythm with no murmur or gallop, abdomen is soft and nontender, extremities have no edema, right radial pulse is 2+.  Patient presents with non-STEMI, now chest pain-free on IV heparin  over the weekend.  Reports no symptoms in the last 24 hours.  Plans for cardiac catheterization and possible PTCA/stenting today.  I reviewed the procedure with the patient and his family members who are at the bedside. I have reviewed the risks, indications, and alternatives to cardiac catheterization, possible angioplasty, and stenting with the patient. Risks include but are not limited to bleeding, infection, vascular injury, stroke, myocardial infection, arrhythmia, kidney injury, radiation-related injury in the case of prolonged fluoroscopy use, emergency cardiac  surgery, and death. The patient understands the risks of serious complication is 1-2 in 1000 with diagnostic cardiac cath and 1-2% or less with angioplasty/stenting.  Other problems as outlined above.  Agree with outpatient lipid clinic referral as the patient would clearly benefit from a PCSK9 inhibitor or other injectable therapy such as inclisiran.  Ozell Fell, M.D. 09/28/2024 11:41 AM

## 2024-09-28 NOTE — Progress Notes (Signed)
 PHARMACY - ANTICOAGULATION CONSULT NOTE  Pharmacy Consult for heparin   Indication: chest pain/ACS  Allergies  Allergen Reactions   Atorvastatin  Other (See Comments)    Myalgias   Rosuvastatin  Other (See Comments)    Myalgias    Patient Measurements: Height: 5' 5 (165.1 cm) Weight: 92.5 kg (203 lb 14.8 oz) IBW/kg (Calculated) : 61.5 HEPARIN  DW (KG): 81.1  Vital Signs: Temp: 97.8 F (36.6 C) (11/03 0419) Temp Source: Oral (11/03 0419) BP: 100/72 (11/03 0419) Pulse Rate: 58 (11/03 0419)  Labs: Recent Labs    09/25/24 1301 09/25/24 1816 09/25/24 2014 09/25/24 2057 09/25/24 2236 09/26/24 0647 09/26/24 1606 09/27/24 0757 09/27/24 1547 09/27/24 2350 09/28/24 0259  HGB 14.8  --   --   --   --  13.8  --  14.6  --   --   --   HCT 43.0  --   --   --   --  39.3  --  42.7  --   --   --   PLT 280  --   --   --   --  273  --  272  --   --   --   HEPARINUNFRC  --   --   --    < >  --  0.22*   < > 0.19* 0.24* 0.42 0.42  CREATININE 1.03  --   --   --   --  1.02  --   --   --   --   --   TROPONINIHS  --  2,295* 5,168*  --  9,375*  --   --   --   --   --   --    < > = values in this interval not displayed.    Estimated Creatinine Clearance: 78.5 mL/min (by C-G formula based on SCr of 1.02 mg/dL).   Medical History: Past Medical History:  Diagnosis Date   Angina pectoris 06/04/2019   Arthritis    Benign essential HTN 02/10/2016   Bronchospasm with bronchitis, acute 12/14/2013   Coronary artery disease status post PTCA and drug-eluting stent to circumflex artery in July 2020 06/26/2019   Dyslipidemia 09/24/2020   GAD (generalized anxiety disorder) 12/14/2013   GERD (gastroesophageal reflux disease)    occ   History of kidney stones    Hypertension 12/14/2013   Hypogonadism male 12/14/2013   Kidney stones 02/10/2016   Myelopathy (HCC) 06/03/2014   Obesity    OSA (obstructive sleep apnea) 02/10/2016   Shortness of breath    occ-allergies   Sleep apnea    cpap 16 yrs       Assessment: 62 yom presented to the ED with NSTEMI and angina. No anticoagulation prior to admission. Pharmacy consulted for heparin .    Heparin  continues to be therapeutic today.  -plans noted for cath on 11/3  Goal of Therapy:  Heparin  level 0.3-0.7 units/ml Monitor platelets by anticoagulation protocol: Yes   Plan:  Continue Heparin  to 1800 units/hr. Daily heparin  level and CBC F/u post cath  Sergio Batch, PharmD, BCIDP, AAHIVP, CPP Infectious Disease Pharmacist 09/28/2024 7:44 AM

## 2024-09-29 ENCOUNTER — Other Ambulatory Visit (HOSPITAL_COMMUNITY): Payer: Self-pay

## 2024-09-29 ENCOUNTER — Encounter (HOSPITAL_COMMUNITY): Payer: Self-pay | Admitting: Internal Medicine

## 2024-09-29 ENCOUNTER — Telehealth (HOSPITAL_COMMUNITY): Payer: Self-pay | Admitting: Pharmacy Technician

## 2024-09-29 DIAGNOSIS — I214 Non-ST elevation (NSTEMI) myocardial infarction: Secondary | ICD-10-CM | POA: Diagnosis not present

## 2024-09-29 LAB — BASIC METABOLIC PANEL WITH GFR
Anion gap: 14 (ref 5–15)
BUN: 14 mg/dL (ref 8–23)
CO2: 19 mmol/L — ABNORMAL LOW (ref 22–32)
Calcium: 9.1 mg/dL (ref 8.9–10.3)
Chloride: 104 mmol/L (ref 98–111)
Creatinine, Ser: 1.04 mg/dL (ref 0.61–1.24)
GFR, Estimated: >60 mL/min (ref >60)
Glucose, Bld: 93 mg/dL (ref 70–99)
Potassium: 3.9 mmol/L (ref 3.5–5.1)
Sodium: 137 mmol/L (ref 135–145)

## 2024-09-29 LAB — CBC
HCT: 41.5 % (ref 39.0–52.0)
Hemoglobin: 14 g/dL (ref 13.0–17.0)
MCH: 32.6 pg (ref 26.0–34.0)
MCHC: 33.7 g/dL (ref 30.0–36.0)
MCV: 96.5 fL (ref 80.0–100.0)
Platelets: 230 K/uL (ref 150–400)
RBC: 4.3 MIL/uL (ref 4.22–5.81)
RDW: 12.7 % (ref 11.5–15.5)
WBC: 11.4 K/uL — ABNORMAL HIGH (ref 4.0–10.5)
nRBC: 0 % (ref 0.0–0.2)

## 2024-09-29 MED ORDER — EZETIMIBE 10 MG PO TABS
10.0000 mg | ORAL_TABLET | Freq: Every day | ORAL | 1 refills | Status: DC
  Filled 2024-09-29: qty 30, 30d supply, fill #0

## 2024-09-29 MED ORDER — TICAGRELOR 90 MG PO TABS
90.0000 mg | ORAL_TABLET | Freq: Two times a day (BID) | ORAL | 2 refills | Status: DC
  Filled 2024-09-29: qty 60, 30d supply, fill #0

## 2024-09-29 MED ORDER — LOSARTAN POTASSIUM 50 MG PO TABS
50.0000 mg | ORAL_TABLET | Freq: Every day | ORAL | 11 refills | Status: AC
  Filled 2024-09-29: qty 30, 30d supply, fill #0

## 2024-09-29 NOTE — Plan of Care (Signed)

## 2024-09-29 NOTE — Progress Notes (Signed)
 CARDIAC REHAB PHASE I   PRE:  Rate/Rhythm: 67 NSR  BP:  Sitting: 108/71      SpO2: 94 RA  MODE:  Ambulation: 470 ft    POST:  Rate/Rhythm: 84 SR  BP:  Sitting: 90/64      SpO2: 94 RA  Pt amb with supervision assistance in the hallway. Pt walked well, pt did c/o chest and shoulder soreness following LHC.  Pt was educated on stent card, stent location, Antiplatelet and ASA use, wt restrictions, no baths/daily wash-ups, s/s of infection, ex guidelines, s/s to stop exercising, NTG use and calling 911, heart healthy diet, risk factors and CRPII. Pt received MI book and materials on exercise, diet, and CRPII. Will refer to [facility].   Pt is not interested in CRP2  Garen FORBES Candy  MS, ACSM-CEP 9:52 AM 09/29/2024

## 2024-09-29 NOTE — Telephone Encounter (Signed)
 Patient Product/process Development Scientist completed.    The patient is insured through [facility]. Patient has [employer], may use a copay card, and/or apply for patient assistance if available.    Ran test claim for ticagrelor  90 mg and the current 30 day co-pay is $46.30.   This test claim was processed through [facility]- copay amounts may vary at other pharmacies due to pharmacy/plan contracts, or as the patient moves through the different stages of their insurance plan.     Reyes Sharps, CPHT Pharmacy Technician Patient Advocate Specialist Lead [facility] Patient Advocate Team Direct Number: [phone]  Fax: [phone]

## 2024-09-29 NOTE — TOC CM/SW Note (Signed)
 Transition of Care [facility]) - Inpatient Brief Assessment   Patient Details  Name: Corey [name] MRN: [id number] Date of Birth: [birth year]  Transition of [facility]) CM/SW Contact:    Sudie Erminio Deems, RN Phone Number: 09/29/2024, 12:21 PM   Clinical Narrative: Patient presented for angina-post PCI plan for Brilinta . Daughter at the bedside and will transport patient home. Patient states he has a PCP and has no issues with transportation. No home needs identified during the visit.    Transition of Care Asessment: Insurance and Status: Insurance coverage has been reviewed Patient has primary care physician: Yes Home environment has been reviewed: reviewed Prior level of function:: independent Prior/Current Home Services: No current home services Social Drivers of Health Review: SDOH reviewed no interventions necessary Readmission risk has been reviewed: Yes Transition of care needs: no transition of care needs at this time

## 2024-09-29 NOTE — Discharge Summary (Signed)
 Discharge Summary   Patient ID: Corey [name] MRN: [id number]; DOB: 10-Aug-1961  Admit date: 09/25/2024 Discharge date: 09/29/2024  PCP:  Corey Mabel Mt, DO   [facility] Providers Cardiologist:  Corey Fitch, MD     Discharge Diagnoses  Principal Problem:   Non-ST elevation (NSTEMI) myocardial infarction [facility]) Active Problems:   Hypertension   Dyslipidemia   Acute angina   Diagnostic Studies/Procedures   Cath: 09/28/2024    Mid RCA lesion is 100% stenosed.   Prox Cx to Mid Cx lesion is 100% stenosed.   1st Mrg-2 lesion is 70% stenosed.   1st Mrg-1 lesion is 70% stenosed.   A stent was successfully placed.   Post intervention, there is a 5% residual stenosis.   Post intervention, there is a 0% residual stenosis.   Post intervention, there is a 40% residual stenosis.   1.  Subtotally occluded mid left circumflex stent.  OCT analysis demonstrated a well sized stent with neointimal hyperplasia.  This was treated with drug coated balloon angioplasty with a 3.0 mm agent balloon with a prolonged inflation time of 1 minute. 2.  High-grade first obtuse marginal lesion treated with 1 drug-eluting stent.  Balloon angioplasty of the ostium of this obtuse marginal was performed as well. 3.  Flow in a small, distal subbranch of the second obtuse marginal was lost.  This is too small for stents or balloons.  Residual low-level chest pain will be treated medically. 4.  Elevated LVEDP of 32 mmHg.   Recommendation: Dual antiplatelet therapy with aspirin  and Brilinta  for at least 6 months and preferably 1 year followed by Brilinta  monotherapy indefinitely.  The results were reviewed with the [facility] cardiology team as well as the patient's daughter Corey [name] by phone.  Diagnostic Dominance: Right  Intervention    Echo: 09/26/2024  IMPRESSIONS     1. Left ventricular ejection fraction, by estimation, is 60 to 65%. The  left ventricle has normal function. The left  ventricle has no regional  wall motion abnormalities. The left ventricular internal cavity size was  mildly dilated. Left ventricular  diastolic parameters are consistent with Grade I diastolic dysfunction  (impaired relaxation).   2. Right ventricular systolic function is normal. The right ventricular  size is normal. There is normal pulmonary artery systolic pressure.   3. The mitral valve is normal in structure. No evidence of mitral valve  regurgitation. No evidence of mitral stenosis.   4. The aortic valve is normal in structure. Aortic valve regurgitation is  not visualized. No aortic stenosis is present.   5. The inferior vena cava is normal in size with greater than 50%  respiratory variability, suggesting right atrial pressure of 3 mmHg.   FINDINGS   Left Ventricle: Left ventricular ejection fraction, by estimation, is 60  to 65%. The left ventricle has normal function. The left ventricle has no  regional wall motion abnormalities. The left ventricular internal cavity  size was mildly dilated. There is   no left ventricular hypertrophy. Left ventricular diastolic parameters  are consistent with Grade I diastolic dysfunction (impaired relaxation).   Right Ventricle: The right ventricular size is normal. No increase in  right ventricular wall thickness. Right ventricular systolic function is  normal. There is normal pulmonary artery systolic pressure. The tricuspid  regurgitant velocity is 2.80 m/s, and   with an assumed right atrial pressure of 3 mmHg, the estimated right  ventricular systolic pressure is 34.4 mmHg.   Left Atrium: Left atrial size was  normal in size.   Right Atrium: Right atrial size was normal in size.   Pericardium: There is no evidence of pericardial effusion.   Mitral Valve: The mitral valve is normal in structure. No evidence of  mitral valve regurgitation. No evidence of mitral valve stenosis.   Tricuspid Valve: The tricuspid valve is normal in  structure. Tricuspid  valve regurgitation is trivial. No evidence of tricuspid stenosis.   Aortic Valve: The aortic valve is normal in structure. Aortic valve  regurgitation is not visualized. No aortic stenosis is present.   Pulmonic Valve: The pulmonic valve was normal in structure. Pulmonic valve  regurgitation is not visualized. No evidence of pulmonic stenosis.   Aorta: The aortic root is normal in size and structure.   Venous: The inferior vena cava is normal in size with greater than 50%  respiratory variability, suggesting right atrial pressure of 3 mmHg.   IAS/Shunts: No atrial level shunt detected by color flow Doppler.   _____________   History of Present Illness   Corey [name] is a 63 y.o. male with CAD s/p PTCA/stent to Cx in 2020 with known occluded RCA, dyslipidemia, HTN, arthritis, polyarthralgia followed by rheumatology, kidney stones, OSA on CPAP, generalized anxiety disorder, former tobacco use who was seen 09/25/2024 for the evaluation of chest pain.    Last cath was in 2020 with PCI to Cx as above with occluded RCA treated medically with robust R-R and L-R collaterals. Nuc 2022 showed small reversible defect of mild severity present in the basal inferior and basal inferolateral location felt consistent with ischemia in territory of known occluded RCA, managed medically, EF 54%. Echo 10/2023 showed EF 60-65%, G1DD.   He went up 4 flights of stairs at 10am the day of admission, subsequent to which he developed left sided chest pressure/hard chest pain that radiated across his chest and into both arms associated with diaphoresis. He rested and symptoms improved after an hour, did not take SL NTG. He then returned to the stair activity and noticed he was having to stop due to SOB. He also began to have recurrent chest pain around noon while exerting himself. Due to ongoing chest pain he came to [facility]. He does periodically have chest burning with emotional upset but  this was different. He received 324mg  ASA, fentanyl  50mcg, and was started on IV heparin . He declined NTG since it historically has given him a terrible headache when used in the past. hsTroponin 20->40, CBC wnl, BMET OK except glu 108. CXR NAD. EKGs show NSR with nonspecific ST-TW changes.   Hospital Course    NSTEMI -- cath 2020 with stenting of the LCx and known CTO of the RCA -- presented with chest pain and hsTn up to 9375, EKG with new TWI in inferior leads -- echo 11/1 with LVEF of 60-65%, no rWMA, g1DD, normal RV -- Underwent cardiac catheterization 11/3 with sob totally occluded mid left circumflex stent, treated with drug-coated balloon angioplasty, high-grade first OM lesion treated with PCI/DES x 1 as well as balloon angioplasty of the ostium of of OM.  Flow in small distal subbranch of the second OM lost but too small for stenting or balloons.  Recommendations for DAPT with aspirin /Brilinta  for at least 1 year then Brilinta  indefinitely --Continue aspirin , Brilinta , Ranexa  1000 mg twice daily   HTN -- BP remained soft during admission -- Initially placed on metoprolol , will DC.  Would reduce losartan  to 50 mg daily and DC HCTZ.  Continue Lasix   20 mg daily.   HLD -- hx of statin intolerance, on Zetia   -- plans for lipid clinic outpatient    OSA on Cpap   General: Well developed, well nourished, male appearing in no acute distress. Head: Normocephalic, atraumatic.  Neck: Supple without bruits, JVD. Lungs:  Resp regular and unlabored, CTA. Heart: RRR, S1, S2, no S3, S4, or murmur; no rub. Abdomen: Soft, non-tender, non-distended with normoactive bowel sounds.  Extremities: No clubbing, cyanosis, edema. Distal pedal pulses are 2+ bilaterally. Right cath site stable without bruising or hematoma Neuro: Alert and oriented X 3. Moves all extremities spontaneously. Psych: Normal affect.   Did the patient have an acute coronary syndrome (MI, NSTEMI, STEMI, etc) this admission?:   Yes                               AHA/ACC ACS Clinical Performance & Quality Measures: Aspirin  prescribed? - Yes ADP Receptor Inhibitor (Plavix/Clopidogrel, Brilinta /Ticagrelor  or Effient/Prasugrel) prescribed (includes medically managed patients)? - Yes Beta Blocker prescribed? - No. The patient has an EF >/= 50%. Based upon the [facility] Study pub in Apr 2024, there is no benefit in patients with preserved EF post MI. High Intensity Statin (Lipitor 40-80mg  or Crestor  20-40mg ) prescribed? - Yes EF assessed during THIS hospitalization? - Yes For EF <40%, was ACEI/ARB prescribed? - Not Applicable (EF >/= 40%) For EF <40%, Aldosterone Antagonist (Spironolactone or Eplerenone) prescribed? - Not Applicable (EF >/= 40%) Cardiac Rehab Phase II ordered (including medically managed patients)? - Yes       The patient will be scheduled for a TOC follow up appointment in 10-14 days.  A message has been sent to the [facility] and Scheduling Pool at the office where the patient should be seen for follow up.  _____________  Discharge Vitals Blood pressure 102/68, pulse 63, temperature 98.1 F (36.7 C), temperature source Oral, resp. rate (!) 24, height 5' 5 (1.651 m), weight 91.4 kg, SpO2 96%.  Filed Weights   09/27/24 0451 09/28/24 0519 09/29/24 0600  Weight: 91.5 kg 92.5 kg 91.4 kg    Labs & Radiologic Studies  CBC Recent Labs    09/28/24 1014 09/29/24 0418  WBC 11.9* 11.4*  HGB 13.3 14.0  HCT 39.3 41.5  MCV 96.1 96.5  PLT 248 230   Basic Metabolic Panel Recent Labs    88/95/74 0418  NA 137  K 3.9  CL 104  CO2 19*  GLUCOSE 93  BUN 14  CREATININE 1.04  CALCIUM  9.1   Liver Function Tests No results for input(s): AST, ALT, ALKPHOS, BILITOT, PROT, ALBUMIN in the last 72 hours. No results for input(s): LIPASE, AMYLASE in the last 72 hours. High Sensitivity Troponin:   Recent Labs  Lab 09/25/24 1816 09/25/24 2014 09/25/24 2236  TROPONINIHS 2,295* 5,168* 9,375*     Recent Labs  Lab 09/25/24 1301 09/25/24 1445  TRNPT 20* 40*    BNP Invalid input(s): POCBNP No results for input(s): PROBNP in the last 72 hours.  No results for input(s): BNP in the last 72 hours.  D-Dimer No results for input(s): DDIMER in the last 72 hours. Hemoglobin A1C Recent Labs    09/27/24 0757  HGBA1C 5.1   Fasting Lipid Panel No results for input(s): CHOL, HDL, LDLCALC, TRIG, CHOLHDL, LDLDIRECT in the last 72 hours. Lipoprotein (a)  Date/Time Value Ref Range Status  01/31/2021 08:06 AM 109.8 (H) <75.0 nmol/L Final    Comment:  Note:  Values greater than or equal to 75.0 nmol/L may        indicate an independent risk factor for CHD,        but must be evaluated with caution when applied        to non-Caucasian populations due to the        influence of genetic factors on Lp(a) across        ethnicities.     Thyroid Function Tests No results for input(s): TSH, T4TOTAL, T3FREE, THYROIDAB in the last 72 hours.  Invalid input(s): FREET3 _____________  CARDIAC CATHETERIZATION Result Date: 09/28/2024   Mid RCA lesion is 100% stenosed.   Prox Cx to Mid Cx lesion is 100% stenosed.   1st Mrg-2 lesion is 70% stenosed.   1st Mrg-1 lesion is 70% stenosed.   A stent was successfully placed.   Post intervention, there is a 5% residual stenosis.   Post intervention, there is a 0% residual stenosis.   Post intervention, there is a 40% residual stenosis. 1.  Subtotally occluded mid left circumflex stent.  OCT analysis demonstrated a well sized stent with neointimal hyperplasia.  This was treated with drug coated balloon angioplasty with a 3.0 mm agent balloon with a prolonged inflation time of 1 minute. 2.  High-grade first obtuse marginal lesion treated with 1 drug-eluting stent.  Balloon angioplasty of the ostium of this obtuse marginal was performed as well. 3.  Flow in a small, distal subbranch of the second obtuse marginal was lost.  This is  too small for stents or balloons.  Residual low-level chest pain will be treated medically. 4.  Elevated LVEDP of 32 mmHg. Recommendation: Dual antiplatelet therapy with aspirin  and Brilinta  for at least 6 months and preferably 1 year followed by Brilinta  monotherapy indefinitely.  The results were reviewed with the [facility] cardiology team as well as the patient's daughter Corey [name] by phone.   ECHOCARDIOGRAM COMPLETE Result Date: 09/26/2024    ECHOCARDIOGRAM REPORT   Patient Name:   Corey [name] Date of Exam: 09/26/2024 Medical Rec #:  [id number]        Height:       65.0 in Accession #:    [id number]       Weight:       200.3 lb Date of Birth:  Nov 19, 1961       BSA:          1.979 m Patient Age:    62 years         BP:           95/68 mmHg Patient Gender: M                HR:           62 bpm. Exam Location:  Inpatient Procedure: 2D Echo (Both Spectral and Color Flow Doppler were utilized during            procedure). Indications:    NSTEMI  History:        Patient has prior history of Echocardiogram examinations, most                 recent 11/14/2023. CAD, Signs/Symptoms:Shortness of Breath; Risk                 Factors:Hypertension, Sleep Apnea and Dyslipidemia.  Sonographer:    Tinnie Barefoot RDCS Referring Phys: 46 DAYNA N DUNN  Sonographer Comments: Suboptimal parasternal window. IMPRESSIONS  1. Left ventricular ejection fraction, by estimation,  is 60 to 65%. The left ventricle has normal function. The left ventricle has no regional wall motion abnormalities. The left ventricular internal cavity size was mildly dilated. Left ventricular diastolic parameters are consistent with Grade I diastolic dysfunction (impaired relaxation).  2. Right ventricular systolic function is normal. The right ventricular size is normal. There is normal pulmonary artery systolic pressure.  3. The mitral valve is normal in structure. No evidence of mitral valve regurgitation. No evidence of mitral stenosis.  4. The aortic  valve is normal in structure. Aortic valve regurgitation is not visualized. No aortic stenosis is present.  5. The inferior vena cava is normal in size with greater than 50% respiratory variability, suggesting right atrial pressure of 3 mmHg. FINDINGS  Left Ventricle: Left ventricular ejection fraction, by estimation, is 60 to 65%. The left ventricle has normal function. The left ventricle has no regional wall motion abnormalities. The left ventricular internal cavity size was mildly dilated. There is  no left ventricular hypertrophy. Left ventricular diastolic parameters are consistent with Grade I diastolic dysfunction (impaired relaxation). Right Ventricle: The right ventricular size is normal. No increase in right ventricular wall thickness. Right ventricular systolic function is normal. There is normal pulmonary artery systolic pressure. The tricuspid regurgitant velocity is 2.80 m/s, and  with an assumed right atrial pressure of 3 mmHg, the estimated right ventricular systolic pressure is 34.4 mmHg. Left Atrium: Left atrial size was normal in size. Right Atrium: Right atrial size was normal in size. Pericardium: There is no evidence of pericardial effusion. Mitral Valve: The mitral valve is normal in structure. No evidence of mitral valve regurgitation. No evidence of mitral valve stenosis. Tricuspid Valve: The tricuspid valve is normal in structure. Tricuspid valve regurgitation is trivial. No evidence of tricuspid stenosis. Aortic Valve: The aortic valve is normal in structure. Aortic valve regurgitation is not visualized. No aortic stenosis is present. Pulmonic Valve: The pulmonic valve was normal in structure. Pulmonic valve regurgitation is not visualized. No evidence of pulmonic stenosis. Aorta: The aortic root is normal in size and structure. Venous: The inferior vena cava is normal in size with greater than 50% respiratory variability, suggesting right atrial pressure of 3 mmHg. IAS/Shunts: No atrial  level shunt detected by color flow Doppler.  LEFT VENTRICLE PLAX 2D LVIDd:         5.60 cm   Diastology LVIDs:         3.90 cm   LV e' medial:    6.74 cm/s LV PW:         1.00 cm   LV E/e' medial:  12.4 LV IVS:        1.00 cm   LV e' lateral:   6.96 cm/s LVOT diam:     2.30 cm   LV E/e' lateral: 12.1 LV SV:         98 LV SV Index:   49 LVOT Area:     4.15 cm LV IVRT:       100 msec  RIGHT VENTRICLE          IVC RV Basal diam:  2.60 cm  IVC diam: 1.90 cm TAPSE (M-mode): 2.2 cm LEFT ATRIUM             Index        RIGHT ATRIUM           Index LA diam:        3.50 cm 1.77 cm/m   RA Area:  13.80 cm LA Vol (A2C):   48.6 ml 24.55 ml/m  RA Volume:   35.40 ml  17.88 ml/m LA Vol (A4C):   44.5 ml 22.48 ml/m LA Biplane Vol: 46.7 ml 23.59 ml/m  AORTIC VALVE LVOT Vmax:   119.00 cm/s LVOT Vmean:  73.700 cm/s LVOT VTI:    0.235 m  AORTA Ao Root diam: 3.40 cm MITRAL VALVE               TRICUSPID VALVE MV Area (PHT): 3.27 cm    TR Peak grad:   31.4 mmHg MV Decel Time: 232 msec    TR Vmax:        280.00 cm/s MV E velocity: 83.90 cm/s MV A velocity: 71.00 cm/s  SHUNTS MV E/A ratio:  1.18        Systemic VTI:  0.24 m                            Systemic Diam: 2.30 cm Oneil Parchment MD Electronically signed by Oneil Parchment MD Signature Date/Time: 09/26/2024/3:05:00 PM    Final    DG Chest Portable 1 View Result Date: 09/25/2024 EXAM: 1 VIEW(S) XRAY OF THE CHEST 09/25/2024 02:29:00 PM COMPARISON: None available. CLINICAL HISTORY: cp FINDINGS: LUNGS AND PLEURA: No focal pulmonary opacity. No pulmonary edema. No pleural effusion. No pneumothorax. HEART AND MEDIASTINUM: Aortic calcification. No acute abnormality of the cardiac and mediastinal silhouettes. BONES AND SOFT TISSUES: Cervical spine surgical hardware. No acute osseous abnormality. IMPRESSION: 1. No acute cardiopulmonary process. . Electronically signed by: Norleen Boxer MD 09/25/2024 03:26 PM EDT RP Workstation: [id number]    Disposition Pt is being discharged home  today in good condition.  Follow-up Plans & Appointments  Discharge Instructions     AMB Referral to [facility]   Complete by: As directed    ACS, urgent referral   Reason For Referral: Lipids   Amb Referral to Cardiac Rehabilitation   Complete by: As directed    Diagnosis:  Coronary Stents NSTEMI     After initial evaluation and assessments completed: Virtual Based Care may be provided alone or in conjunction with Phase 2 Cardiac Rehab based on patient barriers.: Yes   Intensive Cardiac Rehabilitation (ICR) [facility] location only OR Traditional Cardiac Rehabilitation (TCR) *If criteria for ICR are not met will enroll in TCR [facility] only): Yes   Call MD for:  difficulty breathing, headache or visual disturbances   Complete by: As directed    Call MD for:  persistant dizziness or light-headedness   Complete by: As directed    Call MD for:  redness, tenderness, or signs of infection (pain, swelling, redness, odor or green/yellow discharge around incision site)   Complete by: As directed    Diet - low sodium heart healthy   Complete by: As directed    Discharge instructions   Complete by: As directed    Radial Site Care Refer to this sheet in the next few weeks. These instructions provide you with information on caring for yourself after your procedure. Your caregiver may also give you more specific instructions. Your treatment has been planned according to current medical practices, but problems sometimes occur. Call your caregiver if you have any problems or questions after your procedure. HOME CARE INSTRUCTIONS You may shower the day after the procedure. Remove the bandage (dressing) and gently wash the site with plain soap and water. Gently pat the site dry.  Do not apply powder  or lotion to the site.  Do not submerge the affected site in water for 3 to 5 days.  Inspect the site at least twice daily.  Do not flex or bend the affected arm for 24 hours.  No lifting over 5 pounds (2.3  kg) for 5 days after your procedure.  Do not drive home if you are discharged the same day of the procedure. Have someone else drive you.  You may drive 24 hours after the procedure unless otherwise instructed by your caregiver.  What to expect: Any bruising will usually fade within 1 to 2 weeks.  Blood that collects in the tissue (hematoma) may be painful to the touch. It should usually decrease in size and tenderness within 1 to 2 weeks.  SEEK IMMEDIATE MEDICAL CARE IF: You have unusual pain at the radial site.  You have redness, warmth, swelling, or pain at the radial site.  You have drainage (other than a small amount of blood on the dressing).  You have chills.  You have a fever or persistent symptoms for more than 72 hours.  You have a fever and your symptoms suddenly get worse.  Your arm becomes pale, cool, tingly, or numb.  You have heavy bleeding from the site. Hold pressure on the site.   PLEASE DO NOT MISS ANY DOSES OF YOUR BRILINTA !!!!! Also keep a log of you blood pressures and bring back to your follow up appt. Please call the office with any questions.   Patients taking blood thinners should generally stay away from medicines like ibuprofen , Advil , Motrin , naproxen , and Aleve  due to risk of stomach bleeding. You may take Tylenol  as directed or talk to your primary doctor about alternatives.  PLEASE ENSURE THAT YOU DO NOT RUN OUT OF YOUR BRILINTA . This medication is very important to remain on for at least one year. IF you have issues obtaining this medication due to cost please CALL the office 3-5 business days prior to running out in order to prevent missing doses of this medication.   Increase activity slowly   Complete by: As directed        Discharge Medications Allergies as of 09/29/2024       Reactions   Atorvastatin  Other (See Comments)   Myalgias   Rosuvastatin  Other (See Comments)   Myalgias        Medication List     STOP taking these medications     losartan -hydrochlorothiazide 100-25 MG tablet Commonly known as: HYZAAR       TAKE these medications    acetaminophen  650 MG CR tablet Commonly known as: TYLENOL  Take 2,600 mg by mouth every 4 (four) hours.   albuterol  108 (90 Base) MCG/ACT inhaler Commonly known as: VENTOLIN  HFA Inhale 2 puffs into the lungs every 6 (six) hours as needed for wheezing or shortness of breath.   ALPRAZolam  1 MG tablet Commonly known as: XANAX  TAKE 1 TABLET BY MOUTH THREE TIMES DAILY AS NEEDED FOR ANXIETY   aspirin  EC 81 MG tablet Take 1 tablet (81 mg total) by mouth daily.   celecoxib  200 MG capsule Commonly known as: CELEBREX  Take 1 capsule by mouth once daily   ezetimibe  10 MG tablet Commonly known as: ZETIA  Take 1 tablet (10 mg total) by mouth daily. Start taking on: September 30, 2024   FLUoxetine  20 MG capsule Commonly known as: PROZAC  Take 1 capsule (20 mg total) by mouth daily. Increase to 40 mg daily after about one week   fluticasone  50 MCG/ACT  nasal spray Commonly known as: FLONASE  Place 2 sprays into both nostrils daily.   furosemide  20 MG tablet Commonly known as: LASIX  Take 1 tablet by mouth once daily   hydrOXYzine 25 MG capsule Commonly known as: VISTARIL Take 1 capsule (25 mg total) by mouth every 8 (eight) hours as needed.   losartan  50 MG tablet Commonly known as: Cozaar  Take 1 tablet (50 mg total) by mouth daily.   MAGNESIUM PO Take by mouth.   nitroGLYCERIN  0.4 MG SL tablet Commonly known as: NITROSTAT  Place 1 tablet (0.4 mg total) under the tongue every 5 (five) minutes as needed for chest pain.   OMEGA-3 FISH OIL PO Take 1 tablet by mouth daily.   ranolazine  1000 MG SR tablet Commonly known as: RANEXA  Take 1 tablet by mouth twice daily   sildenafil  100 MG tablet Commonly known as: Viagra  Take 0.5-1 tablets (50-100 mg total) by mouth daily as needed for erectile dysfunction.   ticagrelor  90 MG Tabs tablet Commonly known as: BRILINTA  Take 1  tablet (90 mg total) by mouth 2 (two) times daily.   VITAMIN B-12 PO Take by mouth.   VITAMIN C PO Take by mouth.   VITAMIN D PO Take 5,000 Units by mouth daily.         Outstanding Labs/Studies FLP/LFTs in 4 weeks, outpatient lipid clinic referral   Duration of Discharge Encounter: APP Time: 20 minutes   Signed, Corey [name] Rummer, NP 09/29/2024, 11:57 AM  Patient seen, examined. Available data reviewed. Agree with findings, assessment, and plan as outlined by Corey [name] Rummer, NP.  The patient is independently interviewed and examined.  He is alert, oriented, in no distress.  HEENT is normal, JVP is normal, lungs clear bilaterally, heart regular rate rhythm no murmur or gallop, right radial cath site is clear, abdomen is soft and nontender, extremities have no edema, skin is warm and dry with no rash.  The patient complains of very mild dull chest discomfort that has been constant since PCI.  It is not increasing in intensity.  He walked in the halls today with no problems.  He specifically denied intensification of his chest discomfort or any shortness of breath.  I agree with the medication regimen outlined above.  The patient is on aspirin  and ticagrelor .  He is on ezetimibe  in the setting of statin intolerance.  LVEF is normal.  Consider outpatient lipid clinic referral for PCSK9 inhibitor.  Medically stable for discharge.  Recommend that he return to work in 2 weeks.  MD time spent conducting his discharge is 25 minutes and includes my personal exam, I reviewed his coronary angiogram images and interventional images with him and his daughter who is at the bedside, discharge recommendations, review of labs and all available hospital data.  Ozell Fell, M.D. 09/29/2024 11:57 AM

## 2024-09-29 NOTE — Progress Notes (Signed)
 DISCHARGE NOTE HOME LEYLAND KENNA to be discharged Home per MD order. Floor RN Discussed prescriptions and follow up appointments with the patient. Prescriptions given to patient; medication list explained in detail. Patient verbalized understanding. Patient aske that medication be sent to [facility] in [place]/ [place]  Skin clean, dry and intact without evidence of skin break down, no evidence of skin tears noted. IV catheter discontinued intact. Site without signs and symptoms of complications. Dressing and pressure applied. Pt denies pain at the site currently. No complaints noted.  Patient free of lines, drains, and wounds.   An After Visit Summary (AVS) was printed and given to the patient.  Patient escorted via wheelchair, and discharged home via private auto.  Peyton SHAUNNA Pepper, RN

## 2024-09-30 ENCOUNTER — Telehealth: Payer: Self-pay

## 2024-09-30 ENCOUNTER — Encounter: Payer: Self-pay | Admitting: Family Medicine

## 2024-09-30 DIAGNOSIS — I214 Non-ST elevation (NSTEMI) myocardial infarction: Secondary | ICD-10-CM

## 2024-09-30 NOTE — Transitions of Care (Post Inpatient/ED Visit) (Signed)
 09/30/2024  Name: Corey [name] MRN: [id number] DOB: 02/16/1961  Today's TOC FU Call Status: Today's TOC FU Call Status:: Successful TOC FU Call Completed TOC FU Call Complete Date: 09/30/24 Patient's Name and Date of Birth confirmed.  Transition Care Management Follow-up Telephone Call Date of Discharge: 09/29/24 Discharge Facility: [facility]) Type of Discharge: Inpatient Admission Primary Inpatient Discharge Diagnosis:: NSTEMI How have you been since you were released from the hospital?: Better Any questions or concerns?: No  Items Reviewed: Did you receive and understand the discharge instructions provided?: Yes Medications obtained,verified, and reconciled?: Yes (Medications Reviewed) Any new allergies since your discharge?: No Dietary orders reviewed?: Yes Type of Diet Ordered:: Low Sodium Heart Healthy [name] you have support at home?: Yes People in Home [RPT]: alone Name of Support/Comfort Primary Source: Corey [name]  Medications Reviewed Today: Medications Reviewed Today     Reviewed by Corey Reusing, RN (Case Manager) on 09/30/24 at 1312  Med List Status: <None>   Medication Order Taking? Sig Documenting Provider Last Dose Status Informant  acetaminophen  (TYLENOL ) 650 MG CR tablet [id number]  Take 2,600 mg by mouth every 4 (four) hours. [provider]  Active Self  albuterol  (VENTOLIN  HFA) 108 (90 Base) MCG/ACT inhaler [id number]  Inhale 2 puffs into the lungs every 6 (six) hours as needed for wheezing or shortness of breath. Corey Corey Mt, [name]  Active Self  ALPRAZolam  (XANAX ) 1 MG tablet [id number]  TAKE 1 TABLET BY MOUTH THREE TIMES DAILY AS NEEDED FOR ANXIETY Corey Corey Mt, [name]  Active Self  Ascorbic Acid (VITAMIN [name] PO) [id number]  Take by mouth. [provider]  Active Self  aspirin  EC 81 MG tablet [id number]  Take 1 tablet (81 mg total) by mouth daily. Krasowski, Robert J, MD  Active Self  celecoxib  (CELEBREX ) 200 MG  capsule [id number]  Take 1 capsule by mouth once daily Corey Corey Mt, [name]  Active Self  Cyanocobalamin  (VITAMIN B-12 PO) [id number]  Take by mouth. [provider]  Active Self  ezetimibe  (ZETIA ) 10 MG tablet [id number]  Take 1 tablet (10 mg total) by mouth daily. Corey [name] NOVAK, NP  Active   FLUoxetine  (PROZAC ) 20 MG capsule [id number] Yes Take 1 capsule (20 mg total) by mouth daily. Increase to 40 mg daily after about one week Copland, Jessica C, MD  Active Self           Med Note Corey [name]   Fri Sep 25, 2024  2:19 PM) Haven't started yet  fluticasone  (FLONASE ) 50 MCG/ACT nasal spray [id number]  Place 2 sprays into both nostrils daily. Saguier, [place], PA-[name]  Active Self  furosemide  (LASIX ) 20 MG tablet [id number]  Take 1 tablet by mouth once daily Krasowski, Robert J, MD  Active Self  hydrOXYzine (VISTARIL) 25 MG capsule [id number]  Take 1 capsule (25 mg total) by mouth every 8 (eight) hours as needed. Copland, Harlene BROCKS, MD  Active Self  losartan  (COZAAR ) 50 MG tablet [id number]  Take 1 tablet (50 mg total) by mouth daily. Corey [name] NOVAK, NP  Active   MAGNESIUM PO [id number]  Take by mouth. [provider]  Active Self  nitroGLYCERIN  (NITROSTAT ) 0.4 MG SL tablet [id number]  Place 1 tablet (0.4 mg total) under the tongue every 5 (five) minutes as needed for chest pain. Corey Aleck BROCKS, PA-[name]  Active Self  Omega-3 Fatty Acids (OMEGA-3 FISH OIL PO) [id number]  Take 1 tablet by mouth daily. [provider]  Active Self  ranolazine  (  RANEXA ) 1000 MG SR tablet [id number]  Take 1 tablet by mouth twice daily Krasowski, Robert J, MD  Active Self  sildenafil  (VIAGRA ) 100 MG tablet [id number]  Take 0.5-1 tablets (50-100 mg total) by mouth daily as needed for erectile dysfunction. Corey Corey Mt, [name]  Active Self  ticagrelor  (BRILINTA ) 90 MG TABS tablet [id number]  Take 1 tablet (90 mg total) by mouth 2 (two) times daily. Corey [name] NOVAK, NP  Active    VITAMIN D PO [id number]  Take 5,000 Units by mouth daily. [provider]  Active Self            Home Care and Equipment/Supplies: Were Home Health Services Ordered?: NA Any new equipment or medical supplies ordered?: NA  Functional Questionnaire: [name] you need assistance with bathing/showering or dressing?: No [name] you need assistance with meal preparation?: No [name] you need assistance with eating?: No [name] you have difficulty maintaining continence: No [name] you need assistance with getting out of bed/getting out of a chair/moving?: No [name] you have difficulty managing or taking your medications?: No  Follow up appointments reviewed: PCP Follow-up appointment confirmed?: Yes Date of PCP follow-up appointment?: 10/06/24 Follow-up Provider: Mabel Corey Driscilla [name] Follow-up appointment confirmed?: Yes Date of Specialist follow-up appointment?: 10/09/24 Follow-Up Specialty Provider:: Corey [name] you need transportation to your follow-up appointment?: No [name] you understand care options if your condition(s) worsen?: Yes-patient verbalized understanding  SDOH Interventions Today    Flowsheet Row Most Recent Value  SDOH Interventions   Food Insecurity Interventions Intervention Not Indicated  Housing Interventions Intervention Not Indicated  Transportation Interventions Intervention Not Indicated  Utilities Interventions Intervention Not Indicated    Medford Balboa, BSN, RN [place]  VBCI - [facility] RN Care Manager [phone]

## 2024-10-01 ENCOUNTER — Encounter: Payer: Self-pay | Admitting: Cardiology

## 2024-10-02 ENCOUNTER — Telehealth: Payer: Self-pay | Admitting: Pharmacy Technician

## 2024-10-02 ENCOUNTER — Other Ambulatory Visit (HOSPITAL_COMMUNITY): Payer: Self-pay

## 2024-10-02 ENCOUNTER — Telehealth: Payer: Self-pay

## 2024-10-02 NOTE — Progress Notes (Signed)
 Complex Care Management Note Care Guide Note  10/02/2024 Name: Corey [name] MRN: [id number] DOB: 1961/06/12   Complex Care Management Outreach Attempts: An unsuccessful telephone outreach was attempted today to offer the patient information about available complex care management services.  Follow Up Plan:  Additional outreach attempts will be made to offer the patient complex care management information and services.   Encounter Outcome:  No Answer  Dreama [facility]  [facility], [facility] VBCI Assistant Direct Dial: [phone]  Fax: [phone]

## 2024-10-02 NOTE — Telephone Encounter (Signed)
   I tried to do a prior authorization and insurance would not allow one   There is no assistance for generic brilinta  nor brand  On goodrx ticagrelor  90mg  for 180 tablets is 62.48 and 32.51 for 60 tablets at [facility]. [facility] is 300.01 for 30 days on goodrx.   Message to patient

## 2024-10-05 ENCOUNTER — Other Ambulatory Visit: Payer: Self-pay

## 2024-10-05 ENCOUNTER — Telehealth (HOSPITAL_COMMUNITY): Payer: Self-pay

## 2024-10-05 ENCOUNTER — Ambulatory Visit: Admitting: Family Medicine

## 2024-10-05 ENCOUNTER — Encounter: Payer: Self-pay | Admitting: Family Medicine

## 2024-10-05 ENCOUNTER — Telehealth: Payer: Self-pay

## 2024-10-05 ENCOUNTER — Other Ambulatory Visit (HOSPITAL_BASED_OUTPATIENT_CLINIC_OR_DEPARTMENT_OTHER): Payer: Self-pay

## 2024-10-05 VITALS — BP 112/64 | HR 58 | Temp 98.0°F | Resp 16 | Ht 65.0 in | Wt 198.0 lb

## 2024-10-05 DIAGNOSIS — F411 Generalized anxiety disorder: Secondary | ICD-10-CM

## 2024-10-05 DIAGNOSIS — M170 Bilateral primary osteoarthritis of knee: Secondary | ICD-10-CM | POA: Insufficient documentation

## 2024-10-05 DIAGNOSIS — I25118 Atherosclerotic heart disease of native coronary artery with other forms of angina pectoris: Secondary | ICD-10-CM | POA: Diagnosis not present

## 2024-10-05 MED ORDER — QUETIAPINE FUMARATE 25 MG PO TABS: 25.0000 mg | ORAL_TABLET | Freq: Every day | ORAL | 1 refills | Status: DC

## 2024-10-05 MED ORDER — TICAGRELOR 90 MG PO TABS
90.0000 mg | ORAL_TABLET | Freq: Two times a day (BID) | ORAL | 2 refills | Status: DC
  Filled 2024-10-05: qty 180, 90d supply, fill #0

## 2024-10-05 NOTE — Telephone Encounter (Signed)
 Pt is not interested in the cardiac rehab program at this time. Closed referral.

## 2024-10-05 NOTE — Telephone Encounter (Signed)
 Called pt was advised per Dr.Wendling quetiapine is the same medication to do both. Pt stated understand.

## 2024-10-05 NOTE — Progress Notes (Unsigned)
 Patient ID: Corey [name]                 DOB: Mar 23, 1961                    MRN: [id number]      HPI: Corey [name] is a 63 y.o. male patient referred to lipid clinic by Dr. Krasowki. PMH is significant for dyslipidemia, HTN, obesity, OSA, NSTEMI.   Of note, patient experienced NSTEMI on 10/31/ LHC on 11/3 noted stent placed in 2020 was occluded, and was subsequently re-stent. It was recommended patient take DAPT with ticagrelor  for 6 months followed by indefinite ticagrelor . Ticagrelor  is not covered by insurance ***   Patient started on ezetimibe  09/30/24 upon discharge. Has also been taking Omega3. Myalgias reported with prior use of atorvastatin  and rosuvastatin . 09/26/24 lipid panel shows total cholesterol of 169, HDL 37, LDL 113, and TG 96.   Reviewed options for lowering LDL cholesterol, including ezetimibe , PCSK-9 inhibitors, bempedoic acid  and inclisiran.  Discussed mechanisms of action, dosing, side effects and potential decreases in LDL cholesterol.  Also reviewed cost information and potential options for patient assistance.   Current Medications: ezetimibe  10 mg,  Intolerances: Rosuvastatin , atorvastatin  (myalgias).  Risk Factors: Recent NSTEMI, dyslipidemia, HTN, obesity.  LDL goal: <55   Diet:   Exercise:   Family History:   Social History:   Labs:  Lipid Panel     Component Value Date/Time   CHOL 169 09/26/2024 0647   CHOL 189 12/18/2022 0919   TRIG 96 09/26/2024 0647   HDL 37 (L) 09/26/2024 0647   HDL 45 12/18/2022 0919   CHOLHDL 4.6 09/26/2024 0647   VLDL 19 09/26/2024 0647   LDLCALC 113 (H) 09/26/2024 0647   LDLCALC 118 (H) 12/18/2022 0919   LDLDIRECT 108 (H) 03/18/2020 1556   LABVLDL 26 12/18/2022 0919    Past Medical History:  Diagnosis Date   Angina pectoris 06/04/2019   Arthritis    Benign essential HTN 02/10/2016   Bronchospasm with bronchitis, acute 12/14/2013   Coronary artery disease status post PTCA and drug-eluting stent to circumflex  artery in July 2020 06/26/2019   Dyslipidemia 09/24/2020   GAD (generalized anxiety disorder) 12/14/2013   GERD (gastroesophageal reflux disease)    occ   History of kidney stones    Hypertension 12/14/2013   Hypogonadism male 12/14/2013   Kidney stones 02/10/2016   Myelopathy (HCC) 06/03/2014   Obesity    OSA (obstructive sleep apnea) 02/10/2016   Shortness of breath    occ-allergies   Sleep apnea    cpap 16 yrs    Current Outpatient Medications on File Prior to Visit  Medication Sig Dispense Refill   acetaminophen  (TYLENOL ) 650 MG CR tablet Take 2,600 mg by mouth every 4 (four) hours.     albuterol  (VENTOLIN  HFA) 108 (90 Base) MCG/ACT inhaler Inhale 2 puffs into the lungs every 6 (six) hours as needed for wheezing or shortness of breath. 8 g 0   ALPRAZolam  (XANAX ) 1 MG tablet TAKE 1 TABLET BY MOUTH THREE TIMES DAILY AS NEEDED FOR ANXIETY 90 tablet 0   Ascorbic Acid (VITAMIN C PO) Take by mouth.     aspirin  EC 81 MG tablet Take 1 tablet (81 mg total) by mouth daily. 90 tablet 3   Cyanocobalamin  (VITAMIN B-12 PO) Take by mouth.     ezetimibe  (ZETIA ) 10 MG tablet Take 1 tablet (10 mg total) by mouth daily. 90 tablet 1   fluticasone  (FLONASE )  50 MCG/ACT nasal spray Place 2 sprays into both nostrils daily. 16 g 1   furosemide  (LASIX ) 20 MG tablet Take 1 tablet by mouth once daily 90 tablet 1   losartan  (COZAAR ) 50 MG tablet Take 1 tablet (50 mg total) by mouth daily. 30 tablet 11   MAGNESIUM PO Take by mouth.     nitroGLYCERIN  (NITROSTAT ) 0.4 MG SL tablet Place 1 tablet (0.4 mg total) under the tongue every 5 (five) minutes as needed for chest pain. 5 tablet 0   Omega-3 Fatty Acids (OMEGA-3 FISH OIL PO) Take 1 tablet by mouth daily.     QUEtiapine (SEROQUEL) 25 MG tablet Take 1 tablet (25 mg total) by mouth at bedtime. 30 tablet 1   ranolazine  (RANEXA ) 1000 MG SR tablet Take 1 tablet by mouth twice daily 180 tablet 1   sildenafil  (VIAGRA ) 100 MG tablet Take 0.5-1 tablets (50-100 mg total) by  mouth daily as needed for erectile dysfunction. 30 tablet 1   ticagrelor  (BRILINTA ) 90 MG TABS tablet Take 1 tablet (90 mg total) by mouth 2 (two) times daily. 180 tablet 2   ticagrelor  (BRILINTA ) 90 MG TABS tablet Take 1 tablet (90 mg total) by mouth in the morning and at bedtime. 180 tablet 2   VITAMIN D PO Take 5,000 Units by mouth daily.     No current facility-administered medications on file prior to visit.    Allergies  Allergen Reactions   Atorvastatin  Other (See Comments)    Myalgias   Rosuvastatin  Other (See Comments)    Myalgias    Assessment/Plan:  1. Hyperlipidemia -  No problems updated. No problem-specific Assessment & Plan notes found for this encounter.    Thank you,  Leshaun Biebel, Pharm.D Candidate  Robbi Blanch, [address]D Knox Elspeth BIRCH. [facility] [address], [place], [place] [address] Phone: [phone]; Fax: [phone]

## 2024-10-05 NOTE — Telephone Encounter (Signed)
 The quetiapine is the same medication to do both ideally. Thx.

## 2024-10-05 NOTE — Progress Notes (Signed)
 Chief Complaint  Patient presents with   Hospitalization Follow-up    Hospital Follow Up    Subjective: Patient is a 63 y.o. male here for hospitalization follow-up.  Patient was hospitalized on 09/25/2024 and discharged on 11//25 for an NSTEMI.  He had an occluded mid left circumflex stent from 5 years ago.  The patient also had significant stenosis of 3 blood vessels.  He had 3 stents placed.  He was restarted on Brilinta  and aspirin .  His breathing is poor.  He does not have any chest pain.  He does not take his nitro secondary to headaches.  He has an appointment with his cardiologist on Friday.  Patient has a several year history of chronic knee pain.  He saw rheumatology to rule out inflammatory arthropathies.  These diagnoses were deemed less likely. X-rays showed moderate-severe arthritis b/l. He has not seen ortho as he was not interested in surgery then.  He has failed cortisone and viscosupplementation injections.  He lifts heavy things at work and has been unable to do so as his knees are getting worse.  He has been taking Celebrex  for pain which he will have to stop as he has heart conditions requiring dual antiplatelet therapy.  This will also make it less likely he is a viable surgical candidate.  Patient has a history of generalized anxiety.  He has not been on a daily medication.  He started on Prozac  and hydroxyzine which he did not take as he has historically not been well with that.  He had difficulty sleeping.  He recently broke up with his girlfriend.  He was taking THC to decrease his alprazolam  usage as it does help him sleep.  He was not having any other adverse effects.  Past Medical History:  Diagnosis Date   Angina pectoris 06/04/2019   Arthritis    Benign essential HTN 02/10/2016   Bronchospasm with bronchitis, acute 12/14/2013   Coronary artery disease status post PTCA and drug-eluting stent to circumflex artery in July 2020 06/26/2019   Dyslipidemia 09/24/2020   GAD  (generalized anxiety disorder) 12/14/2013   GERD (gastroesophageal reflux disease)    occ   History of kidney stones    Hypertension 12/14/2013   Hypogonadism male 12/14/2013   Kidney stones 02/10/2016   Myelopathy (HCC) 06/03/2014   Obesity    OSA (obstructive sleep apnea) 02/10/2016   Shortness of breath    occ-allergies   Sleep apnea    cpap 16 yrs    Objective: BP 112/64 (BP Location: Left Arm, Patient Position: Sitting)   Pulse (!) 58   Temp 98 F (36.7 C) (Oral)   Resp 16   Ht 5' 5 (1.651 m)   Wt 198 lb (89.8 kg)   SpO2 97%   BMI 32.95 kg/m  General: Awake, appears stated age Heart: RRR, no LE edema Lungs: CTAB, no rales, wheezes or rhonchi. No accessory muscle use MSK: Normal active and passive range of motion of both knees, no TTP or deformity. Neuro: Antalgic gait Psych: Age appropriate judgment and insight, normal affect and mood  Assessment and Plan: Coronary artery disease of native artery of native heart with stable angina pectoris  Primary osteoarthritis of both knees - Plan: Ambulatory referral to Orthopedic Surgery  GAD (generalized anxiety disorder) - Plan: QUEtiapine (SEROQUEL) 25 MG tablet  Appreciate cardiology input.  He will stay on his dual antiplatelet therapy for 6 to 12 months per their recommendation. Unfortunately he is not a good surgical candidate after  recent cardiovascular events.  We will refer him to the orthopedic surgery team for their opinion.  I will help him out with disability is much as I can.  He will get me some paperwork. Chronic, not controlled.  He was using THC Gummies to decrease the use of alprazolam  which I do think is less risky.  Will start Seroquel 25 mg nightly and see how he does over the next month.  He does not wish to see a psychiatrist or counselor at this time. The patient voiced understanding and agreement to the plan.  I spent 45 minutes with the patient discussing the above plans in addition to reviewing the chart  at the time of the visit.  Mabel Mt Broseley, DO 10/05/24  4:31 PM

## 2024-10-05 NOTE — Patient Instructions (Signed)
If you do not hear anything about your referral in the next 1-2 weeks, call our office and ask for an update.  Ice/cold pack over area for 10-15 min twice daily.  Heat (pad or rice pillow in microwave) over affected area, 10-15 minutes twice daily.   OK to take Tylenol 1000 mg (2 extra strength tabs) or 975 mg (3 regular strength tabs) every 6 hours as needed.  Let us know if you need anything.

## 2024-10-05 NOTE — Telephone Encounter (Signed)
 Copied from CRM [id number]. Topic: Clinical - Medication Question >> Oct 05, 2024  3:50 PM [name] wrote: Reason for CRM: Pt has questions about what medications Dr Frann prescribed him today. He says that he thought he was getting 2 different meds, one for the day time to keep him calm and one to help him sleep at night. Please call pt to advise

## 2024-10-05 NOTE — Progress Notes (Unsigned)
 Complex Care Management Note Care Guide Note  10/05/2024 Name: Corey [name] MRN: [id number] DOB: January 18, 1961   Complex Care Management Outreach Attempts: A second unsuccessful outreach was attempted today to offer the patient with information about available complex care management services.  Follow Up Plan:  Additional outreach attempts will be made to offer the patient complex care management information and services.   Encounter Outcome:  Patient Request to Call Back  Dreama [facility]  [facility], [facility] VBCI Assistant Direct Dial: [phone]  Fax: [phone]

## 2024-10-06 ENCOUNTER — Ambulatory Visit: Admitting: Pharmacist

## 2024-10-06 ENCOUNTER — Telehealth: Payer: Self-pay | Admitting: Pharmacist

## 2024-10-06 ENCOUNTER — Telehealth: Payer: Self-pay | Admitting: Pharmacy Technician

## 2024-10-06 ENCOUNTER — Other Ambulatory Visit (HOSPITAL_COMMUNITY): Payer: Self-pay

## 2024-10-06 ENCOUNTER — Ambulatory Visit: Admitting: Family Medicine

## 2024-10-06 LAB — HELIX PHARMACOGENOMICS (PGX) CLOPIDOGREL TEST

## 2024-10-06 NOTE — Telephone Encounter (Signed)
 Pharmacy Patient Advocate Encounter   Received notification from Physician's Office that prior authorization for Repatha is required/requested.   Insurance verification completed.   The patient is insured through [facility].   Per test claim: PA required; PA submitted to above mentioned insurance via Latent Key/confirmation #/EOC [facility] Status is pending

## 2024-10-07 ENCOUNTER — Encounter: Payer: Self-pay | Admitting: Family

## 2024-10-07 ENCOUNTER — Ambulatory Visit: Admitting: Family

## 2024-10-07 ENCOUNTER — Ambulatory Visit: Payer: Self-pay | Admitting: Internal Medicine

## 2024-10-07 ENCOUNTER — Other Ambulatory Visit (HOSPITAL_COMMUNITY): Payer: Self-pay

## 2024-10-07 DIAGNOSIS — M170 Bilateral primary osteoarthritis of knee: Secondary | ICD-10-CM

## 2024-10-07 NOTE — Progress Notes (Signed)
 Office Visit Note   Patient: Corey [name]           Date of Birth: 23-Jul-1961           MRN: [id number] Visit Date: 10/07/2024              Requested by: [name], DO [address] [place],  [place] [address] PCP: [name], DO  Chief Complaint  Patient presents with   Left Knee - Pain   Right Knee - Pain      HPI: The patient is a 63 year old gentleman who is referred to us  by Dr. Dolphus for bilateral knee osteoarthritis.  He reports a longstanding history of bilateral knee pain right worse than left with mechanical symptoms.  He reports bone-on-bone arthritis he has ceased having relief from Depo-Medrol  injections.  He reports also trying supplemental injections without relief  He would like to proceed with a total knee arthroplasty  Of note he does have a significant cardiac history and reports having MI x 2 last week.  He was recently started on Brilinta   Assessment & Plan: Visit Diagnoses: No diagnosis found.  Plan: Begin discussion about total knee arthroplasty today.  Discussed that he will need cardiac clearance and likely need to stabilize on the Brilinta  for up to a year prior to joint replacement. he will follow-up in office at his convenience for surgical planning with Dr Jerri  Follow-Up Instructions: No follow-ups on file.   Right Knee Exam   Muscle Strength  The patient has normal right knee strength.  Tenderness  The patient is experiencing tenderness in the medial joint line.  Range of Motion  The patient has normal right knee ROM.  Other  Erythema: absent Effusion: no effusion present   Left Knee Exam   Muscle Strength  The patient has normal left knee strength.  Tenderness  The patient is experiencing tenderness in the medial joint line.  Range of Motion  The patient has normal left knee ROM.  Other  Erythema: absent Effusion: no effusion present      Patient is alert, oriented,  no adenopathy, well-dressed, normal affect, normal respiratory effort.     Imaging: No results found. No images are attached to the encounter.  Labs: Lab Results  Component Value Date   HGBA1C 5.1 09/27/2024   ESRSEDRATE 11 11/07/2023   LABURIC 4.5 11/07/2023     Lab Results  Component Value Date   ALBUMIN 3.3 (L) 09/26/2024   ALBUMIN 4.3 03/02/2024   ALBUMIN 4.5 09/25/2021    No results found for: MG No results found for: VD25OH  No results found for: PREALBUMIN    Latest Ref Rng & Units 09/29/2024    4:18 AM 09/28/2024   10:14 AM 09/27/2024    7:57 AM  CBC EXTENDED  WBC 4.0 - 10.5 K/uL 11.4  11.9  11.8   RBC 4.22 - 5.81 MIL/uL 4.30  4.09  4.49   Hemoglobin 13.0 - 17.0 g/dL 85.9  86.6  85.3   HCT 39.0 - 52.0 % 41.5  39.3  42.7   Platelets 150 - 400 K/uL 230  248  272      There is no height or weight on file to calculate BMI.  Orders:  No orders of the defined types were placed in this encounter.  No orders of the defined types were placed in this encounter.    Procedures: No procedures performed  Clinical Data: No additional  findings.  ROS:  All other systems negative, except as noted in the HPI. Review of Systems  Objective: Vital Signs: There were no vitals taken for this visit.  Specialty Comments:  No specialty comments available.  PMFS History: Patient Active Problem List   Diagnosis Date Noted   Primary osteoarthritis of both knees 10/05/2024   Non-ST elevation (NSTEMI) myocardial infarction (HCC) 09/28/2024   Acute angina 09/25/2024   Dyslipidemia 09/24/2020   Sleep apnea    Shortness of breath    Obesity    History of kidney stones    Arthritis    Coronary artery disease status post PTCA and drug-eluting stent to circumflex artery in July 2020 06/26/2019   Angina pectoris 06/04/2019   Benign essential HTN 02/10/2016   OSA (obstructive sleep apnea) 02/10/2016   GERD (gastroesophageal reflux disease) 02/10/2016   Kidney  stones 02/10/2016   Myelopathy (HCC) 06/03/2014   Bronchospasm with bronchitis, acute 12/14/2013   GAD (generalized anxiety disorder) 12/14/2013   Hypertension 12/14/2013   Hypogonadism male 12/14/2013   Past Medical History:  Diagnosis Date   Angina pectoris 06/04/2019   Arthritis    Benign essential HTN 02/10/2016   Bronchospasm with bronchitis, acute 12/14/2013   Coronary artery disease status post PTCA and drug-eluting stent to circumflex artery in July 2020 06/26/2019   Dyslipidemia 09/24/2020   GAD (generalized anxiety disorder) 12/14/2013   GERD (gastroesophageal reflux disease)    occ   History of kidney stones    Hypertension 12/14/2013   Hypogonadism male 12/14/2013   Kidney stones 02/10/2016   Myelopathy (HCC) 06/03/2014   Obesity    OSA (obstructive sleep apnea) 02/10/2016   Shortness of breath    occ-allergies   Sleep apnea    cpap 16 yrs    Family History  Problem Relation Age of Onset   Diabetes Mother    Arrhythmia Mother        afib   Heart failure Mother    CAD Father 38       CABG with redo   Healthy Daughter     Past Surgical History:  Procedure Laterality Date   ANTERIOR CERVICAL DECOMP/DISCECTOMY FUSION N/A 06/03/2014   Procedure: ANTERIOR CERVICAL DECOMPRESSION FUSION, CERVICAL FIVE-SIX, CERVICAL SIX-SEVEN WITH INSTRUMENTATION AND ALLOGRAFT;  Surgeon: Oneil Rodgers Priestly, MD;  Location: [facility] OR;  Service: Orthopedics;  Laterality: N/A;   CORONARY IMAGING/OCT N/A 09/28/2024   Procedure: CORONARY IMAGING/OCT;  Surgeon: [name], MD;  Location: [facility] INVASIVE CV LAB;  Service: Cardiovascular;  Laterality: N/A;   CORONARY STENT INTERVENTION N/A 06/19/2019   Procedure: CORONARY STENT INTERVENTION;  Surgeon: Jordan, Peter M, MD;  [facility] INVASIVE CV LAB;  Service: Cardiovascular;  Laterality: N/A;   CORONARY STENT INTERVENTION N/A 09/28/2024   Procedure: CORONARY STENT INTERVENTION;  Surgeon: [name], MD;  Location: [facility] INVASIVE CV LAB;  Service:  Cardiovascular;  Laterality: N/A;   I & D EXTREMITY Left 11/10/2018   Procedure: IRRIGATION AND DEBRIDEMENT EXTREMITY, REVISION AMPUTATION OF LEFT RING FINGER;  Surgeon: [name], MD;  Location: [facility] OR;  Service: Orthopedics;  Laterality: Left;   LEFT HEART CATH AND CORONARY ANGIOGRAPHY N/A 06/19/2019   Procedure: LEFT HEART CATH AND CORONARY ANGIOGRAPHY;  Surgeon: Jordan, Peter M, MD;  Location: [facility] INVASIVE CV LAB;  Service: Cardiovascular;  Laterality: N/A;   LEFT HEART CATH AND CORONARY ANGIOGRAPHY N/A 09/28/2024   Procedure: LEFT HEART CATH AND CORONARY ANGIOGRAPHY;  Surgeon: [name], MD;  Location: [facility] INVASIVE CV  LAB;  Service: Cardiovascular;  Laterality: N/A;   URETHRAL DILATION     child   Social History   Occupational History   Not on file  Tobacco Use   Smoking status: Former    Current packs/day: 0.00    Average packs/day: 2.0 packs/day for 30.0 years (60.0 ttl pk-yrs)    Types: Cigarettes    Start date: 05/15/1981    Quit date: 05/16/2011    Years since quitting: 13.4    Passive exposure: Past   Smokeless tobacco: Never  Vaping Use   Vaping status: Former  Substance and Sexual Activity   Alcohol use: Yes    Comment: rare   Drug use: No   Sexual activity: Not on file

## 2024-10-07 NOTE — Telephone Encounter (Signed)
 Pharmacy Patient Advocate Encounter  Received notification from [facility] that Prior Authorization for repatha has been APPROVED from 10/06/24 to 10/06/27. Ran test claim, Copay is $24.99- one month. This test claim was processed through [facility]- copay amounts may vary at other pharmacies due to pharmacy/plan contracts, or as the patient moves through the different stages of their insurance plan.   PA #/Case ID/Reference #: [id number]

## 2024-10-08 NOTE — Progress Notes (Signed)
 Complex Care Management Note  Care Guide Note 10/08/2024 Name: Corey [name] MRN: [id number] DOB: 06/05/1961  Corey [name] is a 63 y.o. year old male who sees Frann, Mabel Mt, DO for primary care. I reached out to [name] by phone today to offer complex care management services.  [name] was given information about Complex Care Management services today including:   The Complex Care Management services include support from the care team which includes your Nurse Care Manager, Clinical Social Worker, or Pharmacist.  The Complex Care Management team is here to help remove barriers to the health concerns and goals most important to you. Complex Care Management services are voluntary, and the patient may decline or stop services at any time by request to their care team member.   Complex Care Management Consent Status: Patient did not agree to participate in complex care management services at this time.  Follow up plan:  Patient will follow up with PCP.  Encounter Outcome:  Patient declined services.  Dreama [facility]  [facility], [facility] VBCI Assistant Direct Dial: [phone]  Fax: [phone]

## 2024-10-09 ENCOUNTER — Encounter: Payer: Self-pay | Admitting: Emergency Medicine

## 2024-10-09 ENCOUNTER — Other Ambulatory Visit: Payer: Self-pay

## 2024-10-09 ENCOUNTER — Telehealth: Payer: Self-pay | Admitting: Pharmacist Clinician (PhC)/ Clinical Pharmacy Specialist

## 2024-10-09 ENCOUNTER — Other Ambulatory Visit (HOSPITAL_BASED_OUTPATIENT_CLINIC_OR_DEPARTMENT_OTHER): Payer: Self-pay

## 2024-10-09 ENCOUNTER — Encounter: Payer: Self-pay | Admitting: Pharmacist Clinician (PhC)/ Clinical Pharmacy Specialist

## 2024-10-09 ENCOUNTER — Encounter: Payer: Self-pay | Admitting: Cardiology

## 2024-10-09 ENCOUNTER — Ambulatory Visit (INDEPENDENT_AMBULATORY_CARE_PROVIDER_SITE_OTHER): Admitting: Pharmacist Clinician (PhC)/ Clinical Pharmacy Specialist

## 2024-10-09 ENCOUNTER — Ambulatory Visit: Attending: Cardiology | Admitting: Cardiology

## 2024-10-09 VITALS — BP 112/78 | HR 76 | Ht 65.0 in | Wt 195.0 lb

## 2024-10-09 DIAGNOSIS — E785 Hyperlipidemia, unspecified: Secondary | ICD-10-CM | POA: Diagnosis not present

## 2024-10-09 DIAGNOSIS — T466X5D Adverse effect of antihyperlipidemic and antiarteriosclerotic drugs, subsequent encounter: Secondary | ICD-10-CM | POA: Diagnosis not present

## 2024-10-09 DIAGNOSIS — I25118 Atherosclerotic heart disease of native coronary artery with other forms of angina pectoris: Secondary | ICD-10-CM | POA: Diagnosis not present

## 2024-10-09 DIAGNOSIS — F411 Generalized anxiety disorder: Secondary | ICD-10-CM | POA: Diagnosis not present

## 2024-10-09 DIAGNOSIS — G720 Drug-induced myopathy: Secondary | ICD-10-CM | POA: Diagnosis not present

## 2024-10-09 DIAGNOSIS — R0602 Shortness of breath: Secondary | ICD-10-CM

## 2024-10-09 MED ORDER — REPATHA 140 MG/ML ~~LOC~~ SOSY: 140.0000 mg | PREFILLED_SYRINGE | Freq: Once | SUBCUTANEOUS | Status: AC

## 2024-10-09 NOTE — Patient Instructions (Addendum)
 Medication Instructions:  Your physician recommends that you make the following medication changes: Stop taking Zetia  10 mg daily   Please refer to the Current Medication list given to you today.  *If you need a refill on your cardiac medications before your next appointment, please call your pharmacy*   Lab Work: None ordered If you have labs (blood work) drawn today and your tests are completely normal, you will receive your results only by: MyChart Message (if you have MyChart) OR A paper copy in the mail If you have any lab test that is abnormal or we need to change your treatment, we will call you to review the results.   Testing/Procedures: None ordered   Follow-Up: At [facility], you and your health needs are our priority.  As part of our continuing mission to provide you with exceptional heart care, we have created designated Provider Care Teams.  These Care Teams include your primary Cardiologist (physician) and Advanced Practice Providers (APPs -  Physician Assistants and Nurse Practitioners) who all work together to provide you with the care you need, when you need it.  We recommend signing up for the patient portal called MyChart.  Sign up information is provided on this After Visit Summary.  MyChart is used to connect with patients for Virtual Visits (Telemedicine).  Patients are able to view lab/test results, encounter notes, upcoming appointments, etc.  Non-urgent messages can be sent to your provider as well.   To learn more about what you can do with MyChart, go to [email or web address].    Your next appointment:   10 day(s)  The format for your next appointment:   In Person  Provider:   [name], MD    Other Instructions none  Important Information About Sugar

## 2024-10-09 NOTE — Addendum Note (Signed)
 Addended by: GLENFORD ALAN CROME on: 10/09/2024 02:23 PM   Modules accepted: Orders

## 2024-10-09 NOTE — Assessment & Plan Note (Signed)
 Assessment: Patient with ASCVD not at LDL goal of < 70 Most recent LDL 113 on 09/26/2024 Not able to tolerate statins secondary to myalgias - atorvastatin , rosuvastatin  Reviewed options for lowering LDL cholesterol, including ezetimibe , PCSK-9 inhibitors, bempedoic acid  and inclisiran.  Discussed mechanisms of action, dosing, side effects, potential decreases in LDL cholesterol and costs.  Also reviewed potential options for patient assistance.  Plan: Patient agreeable to starting repatha 140 mg q14d Repeat labs after:  3 months Lipid Liver function Will give information on copay card once approved by insurance.

## 2024-10-09 NOTE — Progress Notes (Signed)
 Office Visit    Patient Name: Corey [name] Date of Encounter: 10/09/2024  Primary Care Provider:  [name], DO Primary Cardiologist:  [name], MD  Chief Complaint    Hyperlipidemia   Significant Past Medical History   CAD 05/2019 PCI to Cx (99% stenosis)1; 10/25 NSTEMI w/ ISR of Cx, tx with angioplasty, DES to OM1; 100% occluded mRCA  HTN On losartan  50 mg daily  OSA On CPAP  GAD Currently having increased anxiety, unsure if related to hospitalization     Allergies  Allergen Reactions   Atorvastatin  Other (See Comments)    Myalgias   Rosuvastatin  Other (See Comments)    Myalgias    History of Present Illness    Corey [name] is a 63 y.o. male patient of Dr Fitch, in the office today to discuss options for cholesterol management.   He was in the office to see Dr. Fitch and it was noted he was not taking any cholesterol lowering medication despite recent second coronary event.  He was added to my schedule after seeing the MD.    Insurance Carrier: [employer]  Pharmacy:   [facility] [address] [place]  LDL Cholesterol goal:  LDL < 70  Current Medications:   none  Previously tried:  atorvastatin , rosuvastatin  - myalgias  Family Hx: mother with DM, CHF, AF; father had CABG at 29  Accessory Clinical Findings   Lab Results  Component Value Date   CHOL 169 09/26/2024   HDL 37 (L) 09/26/2024   LDLCALC 113 (H) 09/26/2024   LDLDIRECT 108 (H) 03/18/2020   TRIG 96 09/26/2024   CHOLHDL 4.6 09/26/2024    Lipoprotein (a)  Date/Time Value Ref Range Status  01/31/2021 08:06 AM 109.8 (H) <75.0 nmol/L Final    Comment:    Note:  Values greater than or equal to 75.0 nmol/L may        indicate an independent risk factor for CHD,        but must be evaluated with caution when applied        to non-Caucasian populations due to the        influence of genetic factors on Lp(a) across        ethnicities.     Lab Results  Component Value Date    ALT 23 09/26/2024   AST 114 (H) 09/26/2024   ALKPHOS 54 09/26/2024   BILITOT 0.6 09/26/2024   Lab Results  Component Value Date   CREATININE 1.04 09/29/2024   BUN 14 09/29/2024   NA 137 09/29/2024   K 3.9 09/29/2024   CL 104 09/29/2024   CO2 19 (L) 09/29/2024   Lab Results  Component Value Date   HGBA1C 5.1 09/27/2024    Home Medications    Current Outpatient Medications  Medication Sig Dispense Refill   acetaminophen  (TYLENOL ) 650 MG CR tablet Take 2,600 mg by mouth every 4 (four) hours.     albuterol  (VENTOLIN  HFA) 108 (90 Base) MCG/ACT inhaler Inhale 2 puffs into the lungs every 6 (six) hours as needed for wheezing or shortness of breath. 8 g 0   ALPRAZolam  (XANAX ) 1 MG tablet TAKE 1 TABLET BY MOUTH THREE TIMES DAILY AS NEEDED FOR ANXIETY 90 tablet 0   Ascorbic Acid (VITAMIN C PO) Take by mouth.     aspirin  EC 81 MG tablet Take 1 tablet (81 mg total) by mouth daily. 90 tablet 3   Cyanocobalamin  (VITAMIN B-12 PO) Take by mouth.  Evolocumab (REPATHA) 140 MG/ML SOSY Inject 140 mg into the skin once for 1 dose.     fluticasone  (FLONASE ) 50 MCG/ACT nasal spray Place 2 sprays into both nostrils daily. 16 g 1   furosemide  (LASIX ) 20 MG tablet Take 1 tablet by mouth once daily 90 tablet 1   losartan  (COZAAR ) 50 MG tablet Take 1 tablet (50 mg total) by mouth daily. 30 tablet 11   MAGNESIUM PO Take by mouth.     nitroGLYCERIN  (NITROSTAT ) 0.4 MG SL tablet Place 1 tablet (0.4 mg total) under the tongue every 5 (five) minutes as needed for chest pain. 5 tablet 0   Omega-3 Fatty Acids (OMEGA-3 FISH OIL PO) Take 1 tablet by mouth daily.     QUEtiapine (SEROQUEL) 25 MG tablet Take 1 tablet (25 mg total) by mouth at bedtime. 30 tablet 1   ranolazine  (RANEXA ) 1000 MG SR tablet Take 1 tablet by mouth twice daily 180 tablet 1   sildenafil  (VIAGRA ) 100 MG tablet Take 0.5-1 tablets (50-100 mg total) by mouth daily as needed for erectile dysfunction. 30 tablet 1   ticagrelor  (BRILINTA ) 90 MG  TABS tablet Take 1 tablet (90 mg total) by mouth 2 (two) times daily. 180 tablet 2   VITAMIN D PO Take 5,000 Units by mouth daily.     No current facility-administered medications for this visit.     Assessment & Plan    Dyslipidemia Assessment: Patient with ASCVD not at LDL goal of < 70 Most recent LDL 113 on 09/26/2024 Not able to tolerate statins secondary to myalgias - atorvastatin , rosuvastatin  Reviewed options for lowering LDL cholesterol, including ezetimibe , PCSK-9 inhibitors, bempedoic acid  and inclisiran.  Discussed mechanisms of action, dosing, side effects, potential decreases in LDL cholesterol and costs.  Also reviewed potential options for patient assistance.  Plan: Patient agreeable to starting repatha 140 mg q14d Repeat labs after:  3 months Lipid Liver function Will give information on copay card once approved by insurance.    Allean Mink, PharmD CPP [facility] [address] [place], [place] [address] [phone]  10/09/2024, 4:12 PM

## 2024-10-09 NOTE — Telephone Encounter (Signed)
See office encounter

## 2024-10-09 NOTE — Progress Notes (Signed)
 Cardiology Office Note:    Date:  10/09/2024   ID:  Corey [name], DOB January 11, 1961, MRN [id number]  PCP:  [name], DO  Cardiologist:  [name], MD    Referring MD: [name]*   No chief complaint on file. I was in the hospital  History of Present Illness:    Corey [name] is a 63 y.o. male past medical history significant for coronary artery disease in July 2020 he required PCI stenting of circumflex artery for 99% stenosis he was also found to have complete leak of the right coronary artery at that time additional problem include essential hypertension, dyslipidemia, intolerance to statin.  Comes today to my office after being recently 10 days ago in the hospital he went there because of chest pain that completely occluded circumflex artery that being addressed with PTCA and stenting.  Since that time he is not doing fine he is very nervous, he is also have difficulty tolerating Brilinta  like before he get some shortness of breath.  No chest pain tightness squeezing pressure burning chest otherwise doing fine.  Very nervous and upset about this entire scenario  Past Medical History:  Diagnosis Date   Angina pectoris 06/04/2019   Arthritis    Benign essential HTN 02/10/2016   Bronchospasm with bronchitis, acute 12/14/2013   Coronary artery disease status post PTCA and drug-eluting stent to circumflex artery in July 2020 06/26/2019   Dyslipidemia 09/24/2020   GAD (generalized anxiety disorder) 12/14/2013   GERD (gastroesophageal reflux disease)    occ   History of kidney stones    Hypertension 12/14/2013   Hypogonadism male 12/14/2013   Kidney stones 02/10/2016   Myelopathy (HCC) 06/03/2014   Obesity    OSA (obstructive sleep apnea) 02/10/2016   Shortness of breath    occ-allergies   Sleep apnea    cpap 16 yrs    Past Surgical History:  Procedure Laterality Date   ANTERIOR CERVICAL DECOMP/DISCECTOMY FUSION N/A 06/03/2014   Procedure: ANTERIOR  CERVICAL DECOMPRESSION FUSION, CERVICAL FIVE-SIX, CERVICAL SIX-SEVEN WITH INSTRUMENTATION AND ALLOGRAFT;  Surgeon: Oneil Rodgers Priestly, MD;  Location: [facility] OR;  Service: Orthopedics;  Laterality: N/A;   CORONARY IMAGING/OCT N/A 09/28/2024   Procedure: CORONARY IMAGING/OCT;  Surgeon: [name], MD;  Location: [facility] INVASIVE CV LAB;  Service: Cardiovascular;  Laterality: N/A;   CORONARY STENT INTERVENTION N/A 06/19/2019   Procedure: CORONARY STENT INTERVENTION;  Surgeon: Jordan, Peter M, MD;  Location: [facility] INVASIVE CV LAB;  Service: Cardiovascular;  Laterality: N/A;   CORONARY STENT INTERVENTION N/A 09/28/2024   Procedure: CORONARY STENT INTERVENTION;  Surgeon: [name], MD;  Location: [facility] INVASIVE CV LAB;  Service: Cardiovascular;  Laterality: N/A;   I & D EXTREMITY Left 11/10/2018   Procedure: IRRIGATION AND DEBRIDEMENT EXTREMITY, REVISION AMPUTATION OF LEFT RING FINGER;  Surgeon: [name], MD;  Location: [facility] OR;  Service: Orthopedics;  Laterality: Left;   LEFT HEART CATH AND CORONARY ANGIOGRAPHY N/A 06/19/2019   Procedure: LEFT HEART CATH AND CORONARY ANGIOGRAPHY;  Surgeon: Jordan, Peter M, MD;  Location: [facility] INVASIVE CV LAB;  Service: Cardiovascular;  Laterality: N/A;   LEFT HEART CATH AND CORONARY ANGIOGRAPHY N/A 09/28/2024   Procedure: LEFT HEART CATH AND CORONARY ANGIOGRAPHY;  Surgeon: [name], MD;  Location: [facility] INVASIVE CV LAB;  Service: Cardiovascular;  Laterality: N/A;   URETHRAL DILATION     child    Current Medications: Current Meds  Medication Sig   acetaminophen  (TYLENOL ) 650 MG CR tablet  Take 2,600 mg by mouth every 4 (four) hours.   albuterol  (VENTOLIN  HFA) 108 (90 Base) MCG/ACT inhaler Inhale 2 puffs into the lungs every 6 (six) hours as needed for wheezing or shortness of breath.   ALPRAZolam  (XANAX ) 1 MG tablet TAKE 1 TABLET BY MOUTH THREE TIMES DAILY AS NEEDED FOR ANXIETY   Ascorbic Acid (VITAMIN C PO) Take by mouth.   aspirin  EC 81 MG tablet Take 1 tablet  (81 mg total) by mouth daily.   Cyanocobalamin  (VITAMIN B-12 PO) Take by mouth.   ezetimibe  (ZETIA ) 10 MG tablet Take 1 tablet (10 mg total) by mouth daily.   fluticasone  (FLONASE ) 50 MCG/ACT nasal spray Place 2 sprays into both nostrils daily.   furosemide  (LASIX ) 20 MG tablet Take 1 tablet by mouth once daily   losartan  (COZAAR ) 50 MG tablet Take 1 tablet (50 mg total) by mouth daily.   MAGNESIUM PO Take by mouth.   nitroGLYCERIN  (NITROSTAT ) 0.4 MG SL tablet Place 1 tablet (0.4 mg total) under the tongue every 5 (five) minutes as needed for chest pain.   Omega-3 Fatty Acids (OMEGA-3 FISH OIL PO) Take 1 tablet by mouth daily.   QUEtiapine (SEROQUEL) 25 MG tablet Take 1 tablet (25 mg total) by mouth at bedtime.   ranolazine  (RANEXA ) 1000 MG SR tablet Take 1 tablet by mouth twice daily   sildenafil  (VIAGRA ) 100 MG tablet Take 0.5-1 tablets (50-100 mg total) by mouth daily as needed for erectile dysfunction.   ticagrelor  (BRILINTA ) 90 MG TABS tablet Take 1 tablet (90 mg total) by mouth 2 (two) times daily.   VITAMIN D PO Take 5,000 Units by mouth daily.     Allergies:   Atorvastatin  and Rosuvastatin    Social History   Socioeconomic History   Marital status: Widowed    Spouse name: Not on file   Number of children: Not on file   Years of education: Not on file   Highest education level: Not on file  Occupational History   Not on file  Tobacco Use   Smoking status: Former    Current packs/day: 0.00    Average packs/day: 2.0 packs/day for 30.0 years (60.0 ttl pk-yrs)    Types: Cigarettes    Start date: 05/15/1981    Quit date: 05/16/2011    Years since quitting: 13.4    Passive exposure: Past   Smokeless tobacco: Never  Vaping Use   Vaping status: Former  Substance and Sexual Activity   Alcohol use: Yes    Comment: rare   Drug use: No   Sexual activity: Not on file  Other Topics Concern   Not on file  Social History Narrative   Not on file   Social Drivers of Health    Financial Resource Strain: Not on file  Food Insecurity: No Food Insecurity (09/30/2024)   Hunger Vital Sign    Worried About Running Out of Food in the Last Year: Never true    Ran Out of Food in the Last Year: Never true  Transportation Needs: No Transportation Needs (09/30/2024)   PRAPARE - Administrator, Civil Service (Medical): No    Lack of Transportation (Non-Medical): No  Physical Activity: Not on file  Stress: Not on file  Social Connections: Not on file     Family History: The patient's family history includes Arrhythmia in his mother; CAD (age of onset: 2) in his father; Diabetes in his mother; Healthy in his daughter; Heart failure in his mother. ROS:  Please see the history of present illness.    All 14 point review of systems negative except as described per history of present illness  EKGs/Labs/Other Studies Reviewed:         Recent Labs: 09/26/2024: ALT 23; TSH 1.106 09/29/2024: BUN 14; Creatinine, Ser 1.04; Hemoglobin 14.0; Platelets 230; Potassium 3.9; Sodium 137  Recent Lipid Panel    Component Value Date/Time   CHOL 169 09/26/2024 0647   CHOL 189 12/18/2022 0919   TRIG 96 09/26/2024 0647   HDL 37 (L) 09/26/2024 0647   HDL 45 12/18/2022 0919   CHOLHDL 4.6 09/26/2024 0647   VLDL 19 09/26/2024 0647   LDLCALC 113 (H) 09/26/2024 0647   LDLCALC 118 (H) 12/18/2022 0919   LDLDIRECT 108 (H) 03/18/2020 1556    Physical Exam:    VS:  BP 112/78   Pulse 76   Ht 5' 5 (1.651 m)   Wt 195 lb (88.5 kg)   SpO2 96%   BMI 32.45 kg/m     Wt Readings from Last 3 Encounters:  10/09/24 195 lb (88.5 kg)  10/05/24 198 lb (89.8 kg)  09/29/24 201 lb 6.4 oz (91.4 kg)     GEN:  Well nourished, well developed in no acute distress HEENT: Normal NECK: No JVD; No carotid bruits LYMPHATICS: No lymphadenopathy CARDIAC: RRR, no murmurs, no rubs, no gallops RESPIRATORY:  Clear to auscultation without rales, wheezing or rhonchi  ABDOMEN: Soft, non-tender,  non-distended MUSCULOSKELETAL:  No edema; No deformity  SKIN: Warm and dry LOWER EXTREMITIES: no swelling NEUROLOGIC:  Alert and oriented x 3 PSYCHIATRIC:  Normal affect   ASSESSMENT:    1. Coronary artery disease of native artery of native heart with stable angina pectoris   2. GAD (generalized anxiety disorder)   3. Dyslipidemia   4. Shortness of breath    PLAN:    In order of problems listed above:  Coronary disease: Recent intervention discussed with the patient continue dual antiplatelet therapy, he had difficulty tolerating Brilinta  I stressed importance of taking this medication for at least additional 2 weeks and then we may switch him to a different medication but I told him if he cannot tolerate it he need to let me know this which may need to happen earlier. Dyslipidemia he is taking Zetia  but still complain of having a lot of leg pain.  Will refer him to our pharmacist to start Repatha. Anxiety.  He may need to see therapist to help him with dealing with the situation. Shortness of breath I suspect this is bloating I will see him back in the office very quickly within 10 days to decide course of action.   Medication Adjustments/Labs and Tests Ordered: Current medicines are reviewed at length with the patient today.  Concerns regarding medicines are outlined above.  No orders of the defined types were placed in this encounter.  Medication changes: No orders of the defined types were placed in this encounter.   Signed, Lamar DOROTHA Fitch, MD, [facility] 10/09/2024 1:55 PM    [facility]

## 2024-10-09 NOTE — Telephone Encounter (Signed)
 Statins caused myalgias, noticed similar problem with ezetimbie  2 stents    Start Repatha for   [facility]

## 2024-10-09 NOTE — Addendum Note (Signed)
 Addended by: ARLOA POTASH R on: 10/09/2024 02:29 PM   Modules accepted: Orders

## 2024-10-09 NOTE — Telephone Encounter (Signed)
 Patient had cancelled 10/06/2024 lipid clinic apt. Call to inform Repatha PA is approved and Zetia  amy not be enough to lower LDLc to goal need additional therapy like Repatha. Patient is at [facility] office and they will be getting him to inject his 1st dose today

## 2024-10-12 ENCOUNTER — Ambulatory Visit (INDEPENDENT_AMBULATORY_CARE_PROVIDER_SITE_OTHER): Admitting: Family Medicine

## 2024-10-12 ENCOUNTER — Telehealth: Payer: Self-pay

## 2024-10-12 ENCOUNTER — Encounter: Payer: Self-pay | Admitting: Family Medicine

## 2024-10-12 ENCOUNTER — Other Ambulatory Visit (HOSPITAL_BASED_OUTPATIENT_CLINIC_OR_DEPARTMENT_OTHER): Payer: Self-pay

## 2024-10-12 VITALS — BP 118/78 | HR 66 | Temp 98.0°F | Resp 16 | Ht 65.0 in | Wt 195.0 lb

## 2024-10-12 DIAGNOSIS — R45851 Suicidal ideations: Secondary | ICD-10-CM | POA: Diagnosis not present

## 2024-10-12 DIAGNOSIS — F411 Generalized anxiety disorder: Secondary | ICD-10-CM | POA: Diagnosis not present

## 2024-10-12 MED ORDER — QUETIAPINE FUMARATE 100 MG PO TABS
100.0000 mg | ORAL_TABLET | Freq: Every day | ORAL | 0 refills | Status: DC
  Filled 2024-10-12 (×2): qty 30, 30d supply, fill #0

## 2024-10-12 NOTE — Telephone Encounter (Signed)
 Called spoke with pt daughter regarding concerns of his care, pt daughter said her father is over taking his ALPRAZolam  (XANAX )  and pt is going through a broke up with girl friend. Pt daughter said he is having melt down ,shakes/tremors and feels like something could happen. Pt daughter have tried to get him to go to ER for help and he refused so she call for Dr.Wendling to see him and get help and feels he need to see a Psychiatrist. Pt schedule today to see Dr.Wendling and daughter said she would be on the phone to discuss her concerns.

## 2024-10-12 NOTE — Progress Notes (Signed)
 Chief Complaint  Patient presents with   Depression    Depression     Subjective Corey [name] presents for f/u anxiety/depression. His daughter [name] is on speaker phone and helps w hx.   Pt is currently being treated with Seroquel 25 mg/d, Xanax  1 mg TID prn.  Reports doing poorly since treatment. +thoughts of harming self, not others. No self-medication with alcohol, prescription drugs or illicit drugs. Pt is not following with a counselor/psychologist.  Past Medical History:  Diagnosis Date   Angina pectoris 06/04/2019   Arthritis    Benign essential HTN 02/10/2016   Bronchospasm with bronchitis, acute 12/14/2013   Coronary artery disease status post PTCA and drug-eluting stent to circumflex artery in July 2020 06/26/2019   Dyslipidemia 09/24/2020   GAD (generalized anxiety disorder) 12/14/2013   GERD (gastroesophageal reflux disease)    occ   History of kidney stones    Hypertension 12/14/2013   Hypogonadism male 12/14/2013   Kidney stones 02/10/2016   Myelopathy (HCC) 06/03/2014   Obesity    OSA (obstructive sleep apnea) 02/10/2016   Shortness of breath    occ-allergies   Sleep apnea    cpap 16 yrs   Allergies as of 10/12/2024       Reactions   Atorvastatin  Other (See Comments)   Myalgias   Prozac  [fluoxetine  Hcl] Other (See Comments)   Suicidal ideation   Rosuvastatin  Other (See Comments)   Myalgias        Medication List        Accurate as of October 12, 2024  5:03 PM. If you have any questions, ask your nurse or doctor.          acetaminophen  650 MG CR tablet Commonly known as: TYLENOL  Take 2,600 mg by mouth every 4 (four) hours.   albuterol  108 (90 Base) MCG/ACT inhaler Commonly known as: VENTOLIN  HFA Inhale 2 puffs into the lungs every 6 (six) hours as needed for wheezing or shortness of breath.   ALPRAZolam  1 MG tablet Commonly known as: XANAX  TAKE 1 TABLET BY MOUTH THREE TIMES DAILY AS NEEDED FOR ANXIETY   aspirin  EC 81 MG tablet Take 1  tablet (81 mg total) by mouth daily.   fluticasone  50 MCG/ACT nasal spray Commonly known as: FLONASE  Place 2 sprays into both nostrils daily.   furosemide  20 MG tablet Commonly known as: LASIX  Take 1 tablet by mouth once daily   losartan  50 MG tablet Commonly known as: Cozaar  Take 1 tablet (50 mg total) by mouth daily.   MAGNESIUM PO Take by mouth.   nitroGLYCERIN  0.4 MG SL tablet Commonly known as: NITROSTAT  Place 1 tablet (0.4 mg total) under the tongue every 5 (five) minutes as needed for chest pain.   OMEGA-3 FISH OIL PO Take 1 tablet by mouth daily.   QUEtiapine 100 MG tablet Commonly known as: SEROquel Take 1 tablet (100 mg total) by mouth at bedtime. What changed:  medication strength how much to take Changed by: Corey [name]   ranolazine  1000 MG SR tablet Commonly known as: RANEXA  Take 1 tablet by mouth twice daily   sildenafil  100 MG tablet Commonly known as: Viagra  Take 0.5-1 tablets (50-100 mg total) by mouth daily as needed for erectile dysfunction.   ticagrelor  90 MG Tabs tablet Commonly known as: BRILINTA  Take 1 tablet (90 mg total) by mouth 2 (two) times daily.   VITAMIN B-12 PO Take by mouth.   VITAMIN C PO Take by mouth.   VITAMIN D  PO Take 5,000 Units by mouth daily.        Exam BP 118/78 (BP Location: Left Arm, Patient Position: Sitting)   Pulse 66   Temp 98 F (36.7 C) (Oral)   Resp 16   Ht 5' 5 (1.651 m)   Wt 195 lb (88.5 kg)   SpO2 98%   BMI 32.45 kg/m  General:  well developed, well nourished, in no apparent distress Lungs:  No respiratory distress Psych: well oriented with normal range of affect and age-appropriate judgement/insight, alert and oriented x4.  Assessment and Plan  Suicidal ideation - Plan: QUEtiapine (SEROQUEL) 100 MG tablet  GAD (generalized anxiety disorder) - Plan: QUEtiapine (SEROQUEL) 100 MG tablet  [name] has no thoughts of harming others.  He currently has no thoughts of harming  himself.  He does have access to weapons but no current plan.  He is not willing to see a psychiatrist nor willing to go to the hospital if these intermittent thoughts become stronger.  I will plan to check in with him in a couple days.  We will increase his Seroquel to 100 mg/d.  I did voice that I would like him to see psychiatry urgently which he declines. F/u in 2 weeks.  We will check in with him in a couple days to see if he is okay. The patient voiced understanding and agreement to the plan.  Corey Mt [place], DO 10/12/24 5:03 PM

## 2024-10-12 NOTE — Telephone Encounter (Signed)
 Copied from CRM [id number]. Topic: Clinical - Medical Advice >> Oct 12, 2024  9:16 AM Montie POUR wrote: Reason for CRM:  [name] wants to speak with Dr. Frann only about her dad's medications and health. Otis came in on 10/05/24. He does have the shakes. Please call her on [phone]. She refuses to talk to a triage nurse or anyone else but Dr. Frann.

## 2024-10-12 NOTE — Patient Instructions (Signed)
 Let me know if you change your mind about seeing a specialist.   Let me know if there are med issues.   Let us  know if you need anything.

## 2024-10-13 ENCOUNTER — Encounter: Payer: Self-pay | Admitting: Family Medicine

## 2024-10-14 NOTE — Telephone Encounter (Signed)
 Called pt and daughter was advised short term disability form to sign.

## 2024-10-16 ENCOUNTER — Telehealth: Payer: Self-pay

## 2024-10-16 ENCOUNTER — Other Ambulatory Visit: Payer: Self-pay | Admitting: Family Medicine

## 2024-10-16 DIAGNOSIS — F411 Generalized anxiety disorder: Secondary | ICD-10-CM

## 2024-10-16 MED ORDER — MIRTAZAPINE 7.5 MG PO TABS: 7.5000 mg | ORAL_TABLET | Freq: Every day | ORAL | 1 refills | Status: DC

## 2024-10-16 NOTE — Telephone Encounter (Signed)
 Called spoke with pt daughter regarding FMLA paperwork and new paperwork has been placed in bin for  Provider to review. Pt daughter ask for forms to email to her.

## 2024-10-16 NOTE — Telephone Encounter (Signed)
 Copied from CRM #[id number]. Topic: Clinical - Medical Advice >> Oct 16, 2024  3:43 PM Anairis L wrote: Reason for CRM: [name] Mr.Pherigo daughter is calling because not all of her fathers paperwork was filled out.  Please call,Thank you.

## 2024-10-20 ENCOUNTER — Encounter: Payer: Self-pay | Admitting: Cardiology

## 2024-10-20 ENCOUNTER — Ambulatory Visit: Attending: Cardiology | Admitting: Cardiology

## 2024-10-20 VITALS — BP 110/80 | HR 74 | Ht 65.0 in | Wt 194.0 lb

## 2024-10-20 DIAGNOSIS — G4733 Obstructive sleep apnea (adult) (pediatric): Secondary | ICD-10-CM

## 2024-10-20 DIAGNOSIS — R0602 Shortness of breath: Secondary | ICD-10-CM | POA: Diagnosis not present

## 2024-10-20 DIAGNOSIS — E785 Hyperlipidemia, unspecified: Secondary | ICD-10-CM | POA: Diagnosis not present

## 2024-10-20 DIAGNOSIS — I25118 Atherosclerotic heart disease of native coronary artery with other forms of angina pectoris: Secondary | ICD-10-CM | POA: Diagnosis not present

## 2024-10-20 NOTE — Progress Notes (Signed)
 Cardiology Office Note:    Date:  10/20/2024   ID:  Corey [name], DOB 09/15/1961, MRN [id number]  PCP:  [name], DO  Cardiologist:  [name], MD    Referring MD: [name]*   Chief Complaint  Patient presents with   Follow-up    History of Present Illness:    Corey [name] is a 63 y.o. male  male past medical history significant for coronary artery disease in July 2020 he required PCI stenting of circumflex artery for 99% stenosis he was also found to have complete leak of the right coronary artery at that time additional problem include essential hypertension, dyslipidemia, intolerance to statin.  Comes today to my office after being recently 10 days ago in the hospital he went there because of chest pain that completely occluded circumflex artery that being addressed with PTCA and stenting.  Since that time he is not doing fine he is very nervous, he also looks depressed.  He described similar situation after his wife died couple years ago.  Luckily he does not have any suicidal idealization.  He just simply tell me that he cannot get back to better.  We had a long discussion about what to do with the situation I told him that he need to get psychotherapy I gave him address of a [email or web address] and I told him he can get in touch with them anytime he needs to.  I will also send referral to Jenkins Nicolas who is our psychologist.  He clearly needs help.  Cardiac wise doing well denies have any chest pain tightness squeezing pressure burning chest  Past Medical History:  Diagnosis Date   Angina pectoris 06/04/2019   Arthritis    Benign essential HTN 02/10/2016   Bronchospasm with bronchitis, acute 12/14/2013   Coronary artery disease status post PTCA and drug-eluting stent to circumflex artery in July 2020 06/26/2019   Dyslipidemia 09/24/2020   GAD (generalized anxiety disorder) 12/14/2013   GERD (gastroesophageal reflux disease)    occ   History of  kidney stones    Hypertension 12/14/2013   Hypogonadism male 12/14/2013   Kidney stones 02/10/2016   Myelopathy (HCC) 06/03/2014   Obesity    OSA (obstructive sleep apnea) 02/10/2016   Shortness of breath    occ-allergies   Sleep apnea    cpap 16 yrs    Past Surgical History:  Procedure Laterality Date   ANTERIOR CERVICAL DECOMP/DISCECTOMY FUSION N/A 06/03/2014   Procedure: ANTERIOR CERVICAL DECOMPRESSION FUSION, CERVICAL FIVE-SIX, CERVICAL SIX-SEVEN WITH INSTRUMENTATION AND ALLOGRAFT;  Surgeon: Oneil Rodgers Priestly, MD;  Location: [facility] OR;  Service: Orthopedics;  Laterality: N/A;   CORONARY IMAGING/OCT N/A 09/28/2024   Procedure: CORONARY IMAGING/OCT;  Surgeon: [name], MD;  Location: [facility] INVASIVE CV LAB;  Service: Cardiovascular;  Laterality: N/A;   CORONARY STENT INTERVENTION N/A 06/19/2019   Procedure: CORONARY STENT INTERVENTION;  Surgeon: Jordan, Peter M, MD;  Location: [facility] INVASIVE CV LAB;  Service: Cardiovascular;  Laterality: N/A;   CORONARY STENT INTERVENTION N/A 09/28/2024   Procedure: CORONARY STENT INTERVENTION;  Surgeon: [name], MD;  Location: [facility] INVASIVE CV LAB;  Service: Cardiovascular;  Laterality: N/A;   I & D EXTREMITY Left 11/10/2018   Procedure: IRRIGATION AND DEBRIDEMENT EXTREMITY, REVISION AMPUTATION OF LEFT RING FINGER;  Surgeon: [name], MD;  Location: [facility] OR;  Service: Orthopedics;  Laterality: Left;   LEFT HEART CATH AND CORONARY ANGIOGRAPHY N/A 06/19/2019   Procedure: LEFT HEART CATH AND CORONARY ANGIOGRAPHY;  Surgeon: Jordan, Peter M, MD;  Location: [facility] INVASIVE CV LAB;  Service: Cardiovascular;  Laterality: N/A;   LEFT HEART CATH AND CORONARY ANGIOGRAPHY N/A 09/28/2024   Procedure: LEFT HEART CATH AND CORONARY ANGIOGRAPHY;  Surgeon: [name], MD;  Location: [facility] INVASIVE CV LAB;  Service: Cardiovascular;  Laterality: N/A;   URETHRAL DILATION     child    Current Medications: Current Meds  Medication Sig   acetaminophen  (TYLENOL ) 650 MG  CR tablet Take 2,600 mg by mouth every 4 (four) hours.   albuterol  (VENTOLIN  HFA) 108 (90 Base) MCG/ACT inhaler Inhale 2 puffs into the lungs every 6 (six) hours as needed for wheezing or shortness of breath.   ALPRAZolam  (XANAX ) 1 MG tablet TAKE 1 TABLET BY MOUTH THREE TIMES DAILY AS NEEDED FOR ANXIETY   Ascorbic Acid (VITAMIN C PO) Take by mouth.   aspirin  EC 81 MG tablet Take 1 tablet (81 mg total) by mouth daily.   Cyanocobalamin  (VITAMIN B-12 PO) Take by mouth.   fluticasone  (FLONASE ) 50 MCG/ACT nasal spray Place 2 sprays into both nostrils daily.   furosemide  (LASIX ) 20 MG tablet Take 1 tablet by mouth once daily   losartan  (COZAAR ) 50 MG tablet Take 1 tablet (50 mg total) by mouth daily.   MAGNESIUM PO Take by mouth.   mirtazapine  (REMERON ) 7.5 MG tablet Take 1 tablet (7.5 mg total) by mouth at bedtime.   nitroGLYCERIN  (NITROSTAT ) 0.4 MG SL tablet Place 1 tablet (0.4 mg total) under the tongue every 5 (five) minutes as needed for chest pain.   Omega-3 Fatty Acids (OMEGA-3 FISH OIL PO) Take 1 tablet by mouth daily.   ranolazine  (RANEXA ) 1000 MG SR tablet Take 1 tablet by mouth twice daily   sildenafil  (VIAGRA ) 100 MG tablet Take 0.5-1 tablets (50-100 mg total) by mouth daily as needed for erectile dysfunction.   ticagrelor  (BRILINTA ) 90 MG TABS tablet Take 1 tablet (90 mg total) by mouth 2 (two) times daily.   VITAMIN D PO Take 5,000 Units by mouth daily.     Allergies:   Atorvastatin , Prozac  [fluoxetine  hcl], and Rosuvastatin    Social History   Socioeconomic History   Marital status: Widowed    Spouse name: Not on file   Number of children: Not on file   Years of education: Not on file   Highest education level: Not on file  Occupational History   Not on file  Tobacco Use   Smoking status: Former    Current packs/day: 0.00    Average packs/day: 2.0 packs/day for 30.0 years (60.0 ttl pk-yrs)    Types: Cigarettes    Start date: 05/15/1981    Quit date: 05/16/2011    Years  since quitting: 13.4    Passive exposure: Past   Smokeless tobacco: Never  Vaping Use   Vaping status: Former  Substance and Sexual Activity   Alcohol use: Yes    Comment: rare   Drug use: No   Sexual activity: Not on file  Other Topics Concern   Not on file  Social History Narrative   Not on file   Social Drivers of Health   Financial Resource Strain: Not on file  Food Insecurity: No Food Insecurity (09/30/2024)   Hunger Vital Sign    Worried About Running Out of Food in the Last Year: Never true    Ran Out of Food in the Last Year: Never true  Transportation Needs: No Transportation Needs (09/30/2024)   PRAPARE - Transportation  Lack of Transportation (Medical): No    Lack of Transportation (Non-Medical): No  Physical Activity: Not on file  Stress: Not on file  Social Connections: Not on file     Family History: The patient's family history includes Arrhythmia in his mother; CAD (age of onset: 65) in his father; Diabetes in his mother; Healthy in his daughter; Heart failure in his mother. ROS:   Please see the history of present illness.    All 14 point review of systems negative except as described per history of present illness  EKGs/Labs/Other Studies Reviewed:         Recent Labs: 09/26/2024: ALT 23; TSH 1.106 09/29/2024: BUN 14; Creatinine, Ser 1.04; Hemoglobin 14.0; Platelets 230; Potassium 3.9; Sodium 137  Recent Lipid Panel    Component Value Date/Time   CHOL 169 09/26/2024 0647   CHOL 189 12/18/2022 0919   TRIG 96 09/26/2024 0647   HDL 37 (L) 09/26/2024 0647   HDL 45 12/18/2022 0919   CHOLHDL 4.6 09/26/2024 0647   VLDL 19 09/26/2024 0647   LDLCALC 113 (H) 09/26/2024 0647   LDLCALC 118 (H) 12/18/2022 0919   LDLDIRECT 108 (H) 03/18/2020 1556    Physical Exam:    VS:  BP 110/80   Pulse 74   Ht 5' 5 (1.651 m)   Wt 194 lb (88 kg)   SpO2 94%   BMI 32.28 kg/m     Wt Readings from Last 3 Encounters:  10/20/24 194 lb (88 kg)  10/12/24 195 lb  (88.5 kg)  10/09/24 195 lb (88.5 kg)     GEN:  Well nourished, well developed in no acute distress HEENT: Normal NECK: No JVD; No carotid bruits LYMPHATICS: No lymphadenopathy CARDIAC: RRR, no murmurs, no rubs, no gallops RESPIRATORY:  Clear to auscultation without rales, wheezing or rhonchi  ABDOMEN: Soft, non-tender, non-distended MUSCULOSKELETAL:  No edema; No deformity  SKIN: Warm and dry LOWER EXTREMITIES: no swelling NEUROLOGIC:  Alert and oriented x 3 PSYCHIATRIC:  Normal affect   ASSESSMENT:    1. Coronary artery disease of native artery of native heart with stable angina pectoris   2. OSA (obstructive sleep apnea)   3. Dyslipidemia   4. Shortness of breath    PLAN:    In order of problems listed above:  Coronary disease status post PTCA stenting of circumflex artery.  Doing well from medical standpoint reviewed but not well psychologically.  Plan is as described above. Obstructive sleep apnea followed by internal medicine team. Dyslipidemia he is starting PCSK9 agent. Shortness of breath initially thought to be from Brilinta  but now he said he is doing fine he does not want not want to change to different antiplatelet therapy.   Medication Adjustments/Labs and Tests Ordered: Current medicines are reviewed at length with the patient today.  Concerns regarding medicines are outlined above.  No orders of the defined types were placed in this encounter.  Medication changes: No orders of the defined types were placed in this encounter.   Signed, Lamar DOROTHA Fitch, MD, [facility] 10/20/2024 3:13 PM    [facility]

## 2024-10-20 NOTE — Patient Instructions (Signed)
  [email or web address]  Jenkins Nicolas  Medication Instructions:  Your physician recommends that you continue on your current medications as directed. Please refer to the Current Medication list given to you today.  *If you need a refill on your cardiac medications before your next appointment, please call your pharmacy*   Lab Work: None Ordered If you have labs (blood work) drawn today and your tests are completely normal, you will receive your results only by: MyChart Message (if you have MyChart) OR A paper copy in the mail If you have any lab test that is abnormal or we need to change your treatment, we will call you to review the results.   Testing/Procedures: None Ordered   Follow-Up: At [facility], you and your health needs are our priority.  As part of our continuing mission to provide you with exceptional heart care, we have created designated Provider Care Teams.  These Care Teams include your primary Cardiologist (physician) and Advanced Practice Providers (APPs -  Physician Assistants and Nurse Practitioners) who all work together to provide you with the care you need, when you need it.  We recommend signing up for the patient portal called MyChart.  Sign up information is provided on this After Visit Summary.  MyChart is used to connect with patients for Virtual Visits (Telemedicine).  Patients are able to view lab/test results, encounter notes, upcoming appointments, etc.  Non-urgent messages can be sent to your provider as well.   To learn more about what you can do with MyChart, go to [email or web address].    Your next appointment:   3 month(s)  The format for your next appointment:   In Person  Provider:   [name], MD    Other Instructions NA

## 2024-10-26 ENCOUNTER — Other Ambulatory Visit (HOSPITAL_BASED_OUTPATIENT_CLINIC_OR_DEPARTMENT_OTHER): Payer: Self-pay

## 2024-10-26 ENCOUNTER — Telehealth: Payer: Self-pay

## 2024-10-26 ENCOUNTER — Other Ambulatory Visit: Payer: Self-pay

## 2024-10-26 ENCOUNTER — Encounter: Payer: Self-pay | Admitting: Family Medicine

## 2024-10-26 ENCOUNTER — Ambulatory Visit: Admitting: Family Medicine

## 2024-10-26 VITALS — BP 136/84 | HR 87 | Temp 98.0°F | Resp 16 | Ht 65.0 in | Wt 191.0 lb

## 2024-10-26 DIAGNOSIS — F411 Generalized anxiety disorder: Secondary | ICD-10-CM

## 2024-10-26 MED ORDER — MIRTAZAPINE 7.5 MG PO TABS: 3.7500 mg | ORAL_TABLET | Freq: Every day | ORAL | Status: DC

## 2024-10-26 MED ORDER — TICAGRELOR 90 MG PO TABS
90.0000 mg | ORAL_TABLET | Freq: Two times a day (BID) | ORAL | 3 refills | Status: AC
  Filled 2024-10-26: qty 180, 90d supply, fill #0
  Filled 2024-10-27: qty 60, 30d supply, fill #0
  Filled 2024-10-27: qty 180, 90d supply, fill #0
  Filled 2024-12-07: qty 60, 30d supply, fill #1
  Filled 2024-12-07: qty 60, 30d supply, fill #0

## 2024-10-26 NOTE — Patient Instructions (Addendum)
 Please contact the psychiatry team at [phone].  Send me a message in a couple days with how you did on the mirtazapine  without the alprazolam .   Let us  know if you need anything.

## 2024-10-26 NOTE — Progress Notes (Signed)
 Chief Complaint  Patient presents with   Follow-up    Follow Up    Subjective Corey [name] presents for f/u anxiety/depression.  Pt is currently being treated with Xanax .  Reports doing poorly since treatment. No thoughts of harming self or others. No self-medication with alcohol, prescription drugs or illicit drugs. Pt is starting following with a counselor/psychologist.  Past Medical History:  Diagnosis Date   Angina pectoris 06/04/2019   Arthritis    Benign essential HTN 02/10/2016   Bronchospasm with bronchitis, acute 12/14/2013   Coronary artery disease status post PTCA and drug-eluting stent to circumflex artery in July 2020 06/26/2019   Dyslipidemia 09/24/2020   GAD (generalized anxiety disorder) 12/14/2013   GERD (gastroesophageal reflux disease)    occ   History of kidney stones    Hypertension 12/14/2013   Hypogonadism male 12/14/2013   Kidney stones 02/10/2016   Myelopathy (HCC) 06/03/2014   Obesity    OSA (obstructive sleep apnea) 02/10/2016   Shortness of breath    occ-allergies   Sleep apnea    cpap 16 yrs   Allergies as of 10/26/2024       Reactions   Atorvastatin  Other (See Comments)   Myalgias   Prozac  [fluoxetine  Hcl] Other (See Comments)   Suicidal ideation   Rosuvastatin  Other (See Comments)   Myalgias        Medication List        Accurate as of October 26, 2024  3:55 PM. If you have any questions, ask your nurse or doctor.          acetaminophen  650 MG CR tablet Commonly known as: TYLENOL  Take 2,600 mg by mouth every 4 (four) hours.   albuterol  108 (90 Base) MCG/ACT inhaler Commonly known as: VENTOLIN  HFA Inhale 2 puffs into the lungs every 6 (six) hours as needed for wheezing or shortness of breath.   ALPRAZolam  1 MG tablet Commonly known as: XANAX  TAKE 1 TABLET BY MOUTH THREE TIMES DAILY AS NEEDED FOR ANXIETY   aspirin  EC 81 MG tablet Take 1 tablet (81 mg total) by mouth daily.   fluticasone  50 MCG/ACT nasal spray Commonly  known as: FLONASE  Place 2 sprays into both nostrils daily.   furosemide  20 MG tablet Commonly known as: LASIX  Take 1 tablet by mouth once daily   losartan  50 MG tablet Commonly known as: Cozaar  Take 1 tablet (50 mg total) by mouth daily.   MAGNESIUM PO Take by mouth.   mirtazapine  7.5 MG tablet Commonly known as: REMERON  Take 0.5-1 tablets (3.75-7.5 mg total) by mouth at bedtime. What changed: how much to take Changed by: Corey [name]   nitroGLYCERIN  0.4 MG SL tablet Commonly known as: NITROSTAT  Place 1 tablet (0.4 mg total) under the tongue every 5 (five) minutes as needed for chest pain.   OMEGA-3 FISH OIL PO Take 1 tablet by mouth daily.   ranolazine  1000 MG SR tablet Commonly known as: RANEXA  Take 1 tablet by mouth twice daily   sildenafil  100 MG tablet Commonly known as: Viagra  Take 0.5-1 tablets (50-100 mg total) by mouth daily as needed for erectile dysfunction.   ticagrelor  90 MG Tabs tablet Commonly known as: BRILINTA  Take 1 tablet (90 mg total) by mouth 2 (two) times daily.   VITAMIN B-12 PO Take by mouth.   VITAMIN C PO Take by mouth.   VITAMIN D PO Take 5,000 Units by mouth daily.        Exam BP 136/84 (BP Location: Left Arm,  Patient Position: Sitting)   Pulse 87   Temp 98 F (36.7 C) (Oral)   Resp 16   Ht 5' 5 (1.651 m)   Wt 191 lb (86.6 kg)   SpO2 98%   BMI 31.78 kg/m  General:  well developed, well nourished, in no apparent distress Lungs:  No respiratory distress Psych: well oriented with normal range of affect and age-appropriate judgement/insight, alert and oriented x4.  Assessment and Plan  GAD (generalized anxiety disorder)  Chronic, unstable. Restart mirtazapine  at 3.75 mg qhs prn. Don't take alprazolam  with it. Send message in a few d with how things are going. Could start wellbutrin  depending on how it does.  Contact information for psychiatry was provided.  He is going to set up with a counselor through his  cardiology team. F/u in 1 mo. The patient voiced understanding and agreement to the plan.  Corey Mt [place], DO 10/26/24 3:55 PM

## 2024-10-26 NOTE — Telephone Encounter (Signed)
 Can we check to see if Pt can be seen  sooner than February, per Dr.Wendling

## 2024-10-26 NOTE — Telephone Encounter (Signed)
 sent

## 2024-10-27 ENCOUNTER — Other Ambulatory Visit (HOSPITAL_COMMUNITY): Payer: Self-pay

## 2024-10-27 ENCOUNTER — Ambulatory Visit: Admitting: Psychology

## 2024-10-27 ENCOUNTER — Telehealth: Payer: Self-pay | Admitting: Pharmacist

## 2024-10-27 ENCOUNTER — Other Ambulatory Visit (HOSPITAL_BASED_OUTPATIENT_CLINIC_OR_DEPARTMENT_OTHER): Payer: Self-pay

## 2024-10-27 DIAGNOSIS — F419 Anxiety disorder, unspecified: Secondary | ICD-10-CM | POA: Diagnosis not present

## 2024-10-27 DIAGNOSIS — E785 Hyperlipidemia, unspecified: Secondary | ICD-10-CM

## 2024-10-27 DIAGNOSIS — F32A Depression, unspecified: Secondary | ICD-10-CM | POA: Diagnosis not present

## 2024-10-27 DIAGNOSIS — I25118 Atherosclerotic heart disease of native coronary artery with other forms of angina pectoris: Secondary | ICD-10-CM

## 2024-10-27 MED ORDER — REPATHA SURECLICK 140 MG/ML ~~LOC~~ SOAJ
1.0000 mL | SUBCUTANEOUS | 1 refills | Status: AC
  Filled 2024-10-27: qty 6, 84d supply, fill #0

## 2024-10-27 NOTE — Telephone Encounter (Signed)
 Pt notified that Repatha  has been authorized

## 2024-10-27 NOTE — Telephone Encounter (Signed)
-----   Message from Camelia JONETTA Slade sent at 10/27/2024  8:27 AM EST ----- This was approved on 11/11 encounter. See encounter for additional details ----- Message ----- From: Darrell Bruckner, [facility] Sent: 10/27/2024   7:45 AM EST To: Rx Prior Auth Team  Please run PA for Repatha  ----- Message ----- From: Arloa Olam JONETTA, RN Sent: 10/26/2024   4:28 PM EST To: [facility]  Is there any information on this pt's Repatha ? He was in the office asking if he can pick up the injections and which pharmacy.  He saw Allean in the [place] office in November. Thank you

## 2024-10-27 NOTE — Telephone Encounter (Signed)
 Repatha  has been approved. Sent Rx to [facility]

## 2024-10-27 NOTE — Progress Notes (Unsigned)
 Comprehensive Clinical Assessment (CCA) Note  10/27/2024 Corey [name] [id number]  Time Spent: 4:00 pm - 5:55 pm : 55 minutes  (21 minutes video, 32 minutes phone due to patient's microphone stopped working)  Chief Complaint: I'm lost without my deceased wife, Graig, she passed away Dec 10, 2021and my girlfriend, Marval, left me on October 23, 2024. I also had two heart attacks in one day.  Visit Diagnosis: Depression and Anxiety   Guardian/Payee:  Patient    Paperwork requested: Yes   Reason for Visit /Presenting Problem: Depression and Anxiety   Mental Status Exam: Appearance:   Casual     Behavior:  Sharing  Motor:  Normal  Speech/Language:   Pressured  Affect:  Depressed  Mood:  anxious  Thought process:  normal  Thought content:    WNL  Sensory/Perceptual disturbances:    WNL  Orientation:  oriented to person, place, and time/date  Attention:  Fair  Concentration:  Fair  Memory:  WNL  Fund of knowledge:   Good  Insight:    Fair  Judgment:   Fair  Impulse Control:  Fair   Reported Symptoms:  Depression and Anxiety  Risk Assessment: Danger to Self:  No Self-injurious Behavior: No Danger to Others: No Duty to Warn:no Physical Aggression / Violence:No  Access to Firearms a concern: Has access to firearms, but reported that they were locked up and stated that he would never use them.  Gang Involvement:No  Patient / guardian was educated about steps to take if suicide or homicide risk level increases between visits: yes While future psychiatric events cannot be accurately predicted, the patient does not currently require acute inpatient psychiatric care and does not currently meet [facility]  involuntary commitment criteria.  Substance Abuse History: Current substance abuse:Not currently, but has used THC in the past to help with sleep.   Caffeine: Tobacco: Alcohol: Substance use:  Past Psychiatric History:   Previous psychological history is  significant for seeing a psychiatrist, but patient could not remember exactly when or why he saw a psychiatrist in the past.  Outpatient Providers:Saw a psychiatrist in the past History of Psych Hospitalization: No  Psychological Testing: No   Abuse History:  Victim of: No. Report needed: No. Victim of Neglect:No. Perpetrator of None  Witness / Exposure to Domestic Violence: No   Protective Services Involvement: No  Witness to [employer] Violence:  No   Family History:  Family History  Problem Relation Age of Onset   Diabetes Mother    Arrhythmia Mother        afib   Heart failure Mother    CAD Father 52       CABG with redo   Healthy Daughter     Living situation: the patient lives with their mother  Sexual Orientation: Straight  Relationship Status: widowed  Name of spouse / other:girlfriend, Marval If a parent, number of children / ages: 2 step daughters Delon (has issues, chem. Dependent, 62 yo, Alan is married 11/05/35 you, 1 biological daughter Royale Swamy, 3 grandsons 30.5 yo Emmalene, Jaxon 11yo, Decklan 63 yo  Support Systems: daughter, and youngest step-daughter.  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  Religion/Sprituality/World View: Not discussed during this session.   Any cultural differences that may affect / interfere with treatment:  not applicable   Recreation/Hobbies: Not discussed during this  session  Stressors: Health problems   Loss of second wife, Graig , loss of girlfriend, Debbie    Strengths: Supportive Relationships, Able to Communicate Effectively, sense of humor  Barriers:  Doesn't have a lot of friends   Legal History: Pending legal issue / charges: The patient has no significant history of legal issues. History of legal issue /  charges: No  Medical History/Surgical History: not reviewed Past Medical History:  Diagnosis Date   Angina pectoris 06/04/2019   Arthritis    Benign essential HTN 02/10/2016   Bronchospasm with bronchitis, acute 12/14/2013   Coronary artery disease status post PTCA and drug-eluting stent to circumflex artery in July 2020 06/26/2019   Dyslipidemia 09/24/2020   GAD (generalized anxiety disorder) 12/14/2013   GERD (gastroesophageal reflux disease)    occ   History of kidney stones    Hypertension 12/14/2013   Hypogonadism male 12/14/2013   Kidney stones 02/10/2016   Myelopathy (HCC) 06/03/2014   Obesity    OSA (obstructive sleep apnea) 02/10/2016   Shortness of breath    occ-allergies   Sleep apnea    cpap 16 yrs    Past Surgical History:  Procedure Laterality Date   ANTERIOR CERVICAL DECOMP/DISCECTOMY FUSION N/A 06/03/2014   Procedure: ANTERIOR CERVICAL DECOMPRESSION FUSION, CERVICAL FIVE-SIX, CERVICAL SIX-SEVEN WITH INSTRUMENTATION AND ALLOGRAFT;  Surgeon: Oneil Rodgers Priestly, MD;  Location: [facility] OR;  Service: Orthopedics;  Laterality: N/A;   CORONARY IMAGING/OCT N/A 09/28/2024   Procedure: CORONARY IMAGING/OCT;  Surgeon: [name], MD;  Location: [facility] INVASIVE CV LAB;  Service: Cardiovascular;  Laterality: N/A;   CORONARY STENT INTERVENTION N/A 06/19/2019   Procedure: CORONARY STENT INTERVENTION;  Surgeon: Jordan, Peter M, MD;  Location: [facility] INVASIVE CV LAB;  Service: Cardiovascular;  Laterality: N/A;   CORONARY STENT INTERVENTION N/A 09/28/2024   Procedure: CORONARY STENT INTERVENTION;  Surgeon: [name], MD;  Location: [facility] INVASIVE CV LAB;  Service: Cardiovascular;  Laterality: N/A;   I & D EXTREMITY Left 11/10/2018   Procedure: IRRIGATION AND DEBRIDEMENT EXTREMITY, REVISION AMPUTATION OF LEFT RING FINGER;  Surgeon: [name], MD;  Location: [facility] OR;  Service: Orthopedics;  Laterality: Left;   LEFT HEART CATH AND CORONARY ANGIOGRAPHY N/A 06/19/2019   Procedure: LEFT HEART CATH  AND CORONARY ANGIOGRAPHY;  Surgeon: Jordan, Peter M, MD;  Location: [facility] INVASIVE CV LAB;  Service: Cardiovascular;  Laterality: N/A;   LEFT HEART CATH AND CORONARY ANGIOGRAPHY N/A 09/28/2024   Procedure: LEFT HEART CATH AND CORONARY ANGIOGRAPHY;  Surgeon: [name], MD;  Location: [facility] INVASIVE CV LAB;  Service: Cardiovascular;  Laterality: N/A;   URETHRAL DILATION     child    Medications: Current Outpatient Medications  Medication Sig Dispense Refill   acetaminophen  (TYLENOL ) 650 MG CR tablet Take 2,600 mg by mouth every 4 (four) hours.     albuterol  (VENTOLIN  HFA) 108 (90 Base) MCG/ACT inhaler Inhale 2 puffs into the lungs every 6 (six) hours as needed for wheezing or shortness of breath. 8 g 0   ALPRAZolam  (XANAX ) 1 MG tablet TAKE 1 TABLET BY MOUTH THREE TIMES DAILY AS NEEDED FOR ANXIETY 90 tablet 0   Ascorbic Acid (VITAMIN C PO) Take by mouth.     aspirin  EC 81 MG tablet Take 1 tablet (81 mg total) by mouth daily. 90 tablet 3   Cyanocobalamin  (VITAMIN B-12 PO) Take by mouth.     Evolocumab  (REPATHA  SURECLICK) 140 MG/ML SOAJ Inject 140 mg into the skin every 14 (fourteen) days. 6 mL  1   fluticasone  (FLONASE ) 50 MCG/ACT nasal spray Place 2 sprays into both nostrils daily. 16 g 1   furosemide  (LASIX ) 20 MG tablet Take 1 tablet by mouth once daily 90 tablet 1   losartan  (COZAAR ) 50 MG tablet Take 1 tablet (50 mg total) by mouth daily. 30 tablet 11   MAGNESIUM PO Take by mouth.     mirtazapine  (REMERON ) 7.5 MG tablet Take 0.5-1 tablets (3.75-7.5 mg total) by mouth at bedtime.     nitroGLYCERIN  (NITROSTAT ) 0.4 MG SL tablet Place 1 tablet (0.4 mg total) under the tongue every 5 (five) minutes as needed for chest pain. 5 tablet 0   Omega-3 Fatty Acids (OMEGA-3 FISH OIL PO) Take 1 tablet by mouth daily.     ranolazine  (RANEXA ) 1000 MG SR tablet Take 1 tablet by mouth twice daily 180 tablet 1   sildenafil  (VIAGRA ) 100 MG tablet Take 0.5-1 tablets (50-100 mg total) by mouth daily as needed for  erectile dysfunction. 30 tablet 1   ticagrelor  (BRILINTA ) 90 MG TABS tablet Take 1 tablet (90 mg total) by mouth 2 (two) times daily. 180 tablet 3   VITAMIN D PO Take 5,000 Units by mouth daily.     No current facility-administered medications for this visit.    Allergies  Allergen Reactions   Atorvastatin  Other (See Comments)    Myalgias   Prozac  [Fluoxetine  Hcl] Other (See Comments)    Suicidal ideation   Rosuvastatin  Other (See Comments)    Myalgias    Diagnoses:  Anxiety and depression F41.9, F32.A  Psychiatric Treatment: Not currently, but has been referred for medication management to the resources listed under Plan of Care below.   Plan of Care:  Recommend ongoing individual psychotherapy following psychiatric evaluation for medication management.  Recommend psychiatric evaluation for medication management at [facility], [address] [place], [place], tomorrow, Wednesday, 10/28/24. Patient agreed to go tomorrow after he made arrangements for his grandson's care.   [facility] 24-hour emergency hotline at [phone] or [phone], or walk into [facility] at [address], [place], [place] [address].  [phone] Mobile Crisis Unit.  Additional psychiatric med management options for patient are:   [facility]- [phone] Pt can be seen within a few weeks [facility]: [phone] Pt can be seen next week or week after   Narrative:   [name] participated from home, via video, is aware of tele-sessions limitations, and consented to treatment. Therapist participated from home office. We reviewed the limits of confidentiality prior to the start of the evaluation. [name] expressed understanding and agreement to proceed.   Patient is a 63 year old widower was referred by PCP Drs. Frann (PCP) and Krasowski (Cardiology) for psychotherapy for depression, loss, and  medical issues. Patient reported several significant losses including most recently his girlfriend of 4 years leaving him without any explanation, having two heart attacks in one day, and his second wife Graig dying 4 years ago of Covid.   Patient denied current suicidal or homicidal ideation, but he did report being unable to sleep and crying on a regular basis. Patient was tearful throughout our session, and he had trouble answering the questions due to his tearfulness.   Patient was referred to psychiatry by his PCP. Patient called the psychiatry office yesterday at [phone] and they told me they couldn't see me until the middle of February 2026.   Patient was receptive to seeing a psychiatrist as  soon as possible to get medication management and to get my life back. Patient was strongly encouraged to go to the [facility] because he could receive a psychiatric evaluation for medication management without an appt. Patient was receptive to bringing his daughter, Devontre Siedschlag, (who lives next door to patient), with him because he identified her as being very involved in his care. Patient planned to go to his daughter's house following our session to make arrangements to go to [facility] tomorrow.    Additional information will be gathered at the treatment planning session next week.     Jenkins CHRISTELLA Nicolas

## 2024-10-28 ENCOUNTER — Emergency Department (HOSPITAL_COMMUNITY)

## 2024-10-28 ENCOUNTER — Other Ambulatory Visit: Payer: Self-pay

## 2024-10-28 ENCOUNTER — Observation Stay (HOSPITAL_COMMUNITY)
Admission: EM | Admit: 2024-10-28 | Discharge: 2024-10-31 | Disposition: A | Attending: Family Medicine | Admitting: Family Medicine

## 2024-10-28 ENCOUNTER — Encounter (HOSPITAL_COMMUNITY): Payer: Self-pay | Admitting: Emergency Medicine

## 2024-10-28 DIAGNOSIS — F419 Anxiety disorder, unspecified: Secondary | ICD-10-CM | POA: Insufficient documentation

## 2024-10-28 DIAGNOSIS — E291 Testicular hypofunction: Secondary | ICD-10-CM | POA: Diagnosis present

## 2024-10-28 DIAGNOSIS — I700 Atherosclerosis of aorta: Secondary | ICD-10-CM | POA: Diagnosis not present

## 2024-10-28 DIAGNOSIS — R58 Hemorrhage, not elsewhere classified: Secondary | ICD-10-CM | POA: Diagnosis not present

## 2024-10-28 DIAGNOSIS — I10 Essential (primary) hypertension: Secondary | ICD-10-CM | POA: Diagnosis not present

## 2024-10-28 DIAGNOSIS — J45909 Unspecified asthma, uncomplicated: Secondary | ICD-10-CM | POA: Insufficient documentation

## 2024-10-28 DIAGNOSIS — Z7982 Long term (current) use of aspirin: Secondary | ICD-10-CM | POA: Diagnosis not present

## 2024-10-28 DIAGNOSIS — G4733 Obstructive sleep apnea (adult) (pediatric): Secondary | ICD-10-CM | POA: Diagnosis present

## 2024-10-28 DIAGNOSIS — F411 Generalized anxiety disorder: Secondary | ICD-10-CM | POA: Diagnosis present

## 2024-10-28 DIAGNOSIS — N200 Calculus of kidney: Secondary | ICD-10-CM | POA: Insufficient documentation

## 2024-10-28 DIAGNOSIS — R4182 Altered mental status, unspecified: Secondary | ICD-10-CM | POA: Insufficient documentation

## 2024-10-28 DIAGNOSIS — Z79899 Other long term (current) drug therapy: Secondary | ICD-10-CM | POA: Diagnosis not present

## 2024-10-28 DIAGNOSIS — Z041 Encounter for examination and observation following transport accident: Secondary | ICD-10-CM | POA: Diagnosis not present

## 2024-10-28 DIAGNOSIS — T50902A Poisoning by unspecified drugs, medicaments and biological substances, intentional self-harm, initial encounter: Principal | ICD-10-CM | POA: Diagnosis present

## 2024-10-28 DIAGNOSIS — T424X2A Poisoning by benzodiazepines, intentional self-harm, initial encounter: Secondary | ICD-10-CM | POA: Diagnosis not present

## 2024-10-28 DIAGNOSIS — Z8679 Personal history of other diseases of the circulatory system: Secondary | ICD-10-CM

## 2024-10-28 DIAGNOSIS — S0990XA Unspecified injury of head, initial encounter: Secondary | ICD-10-CM | POA: Diagnosis not present

## 2024-10-28 DIAGNOSIS — R4589 Other symptoms and signs involving emotional state: Secondary | ICD-10-CM

## 2024-10-28 LAB — CBC WITH DIFFERENTIAL/PLATELET
Abs Immature Granulocytes: 0.03 K/uL (ref 0.00–0.07)
Basophils Absolute: 0 K/uL (ref 0.0–0.1)
Basophils Relative: 1 %
Eosinophils Absolute: 0.3 K/uL (ref 0.0–0.5)
Eosinophils Relative: 4 %
HCT: 41.2 % (ref 39.0–52.0)
Hemoglobin: 13.9 g/dL (ref 13.0–17.0)
Immature Granulocytes: 0 %
Lymphocytes Relative: 28 %
Lymphs Abs: 2.4 K/uL (ref 0.7–4.0)
MCH: 32.4 pg (ref 26.0–34.0)
MCHC: 33.7 g/dL (ref 30.0–36.0)
MCV: 96 fL (ref 80.0–100.0)
Monocytes Absolute: 0.9 K/uL (ref 0.1–1.0)
Monocytes Relative: 10 %
Neutro Abs: 5.1 K/uL (ref 1.7–7.7)
Neutrophils Relative %: 57 %
Platelets: 213 K/uL (ref 150–400)
RBC: 4.29 MIL/uL (ref 4.22–5.81)
RDW: 13.7 % (ref 11.5–15.5)
WBC: 8.8 K/uL (ref 4.0–10.5)
nRBC: 0 % (ref 0.0–0.2)

## 2024-10-28 LAB — COMPREHENSIVE METABOLIC PANEL WITH GFR
ALT: 8 U/L (ref 0–44)
AST: 12 U/L — ABNORMAL LOW (ref 15–41)
Albumin: 3.1 g/dL — ABNORMAL LOW (ref 3.5–5.0)
Alkaline Phosphatase: 55 U/L (ref 38–126)
Anion gap: 3 — ABNORMAL LOW (ref 5–15)
BUN: 9 mg/dL (ref 8–23)
CO2: 28 mmol/L (ref 22–32)
Calcium: 8.6 mg/dL — ABNORMAL LOW (ref 8.9–10.3)
Chloride: 108 mmol/L (ref 98–111)
Creatinine, Ser: 0.91 mg/dL (ref 0.61–1.24)
GFR, Estimated: >60 mL/min (ref >60)
Glucose, Bld: 103 mg/dL — ABNORMAL HIGH (ref 70–99)
Potassium: 3.7 mmol/L (ref 3.5–5.1)
Sodium: 139 mmol/L (ref 135–145)
Total Bilirubin: 0.7 mg/dL (ref 0.0–1.2)
Total Protein: 6.1 g/dL — ABNORMAL LOW (ref 6.5–8.1)

## 2024-10-28 LAB — ACETAMINOPHEN LEVEL: Acetaminophen (Tylenol), Serum: <10 ug/mL — ABNORMAL LOW (ref 10–30)

## 2024-10-28 LAB — ETHANOL: Alcohol, Ethyl (B): <15 mg/dL (ref <15)

## 2024-10-28 LAB — SALICYLATE LEVEL: Salicylate Lvl: <7 mg/dL — ABNORMAL LOW (ref 7.0–30.0)

## 2024-10-28 MED ORDER — ONDANSETRON HCL 4 MG/2ML IJ SOLN
4.0000 mg | Freq: Once | INTRAMUSCULAR | Status: AC
  Administered 2024-10-29: 4 mg via INTRAVENOUS
  Filled 2024-10-28: qty 2

## 2024-10-28 MED ORDER — SODIUM CHLORIDE 0.9 % IV BOLUS
1000.0000 mL | Freq: Once | INTRAVENOUS | Status: AC
  Administered 2024-10-28: 1000 mL via INTRAVENOUS
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
## 2024-10-28 NOTE — ED Notes (Signed)
 Corey [name] (daughter) [phone] is requesting a update as soon as possible

## 2024-10-29 ENCOUNTER — Emergency Department (HOSPITAL_COMMUNITY)

## 2024-10-29 ENCOUNTER — Encounter (HOSPITAL_COMMUNITY): Payer: Self-pay | Admitting: Internal Medicine

## 2024-10-29 ENCOUNTER — Telehealth: Payer: Self-pay

## 2024-10-29 DIAGNOSIS — F411 Generalized anxiety disorder: Secondary | ICD-10-CM

## 2024-10-29 DIAGNOSIS — S199XXA Unspecified injury of neck, initial encounter: Secondary | ICD-10-CM | POA: Diagnosis not present

## 2024-10-29 DIAGNOSIS — T07XXXA Unspecified multiple injuries, initial encounter: Secondary | ICD-10-CM | POA: Diagnosis not present

## 2024-10-29 DIAGNOSIS — M47812 Spondylosis without myelopathy or radiculopathy, cervical region: Secondary | ICD-10-CM | POA: Diagnosis not present

## 2024-10-29 DIAGNOSIS — J45909 Unspecified asthma, uncomplicated: Secondary | ICD-10-CM | POA: Insufficient documentation

## 2024-10-29 DIAGNOSIS — T50902A Poisoning by unspecified drugs, medicaments and biological substances, intentional self-harm, initial encounter: Principal | ICD-10-CM | POA: Diagnosis present

## 2024-10-29 DIAGNOSIS — R4589 Other symptoms and signs involving emotional state: Secondary | ICD-10-CM

## 2024-10-29 DIAGNOSIS — R4182 Altered mental status, unspecified: Secondary | ICD-10-CM | POA: Insufficient documentation

## 2024-10-29 DIAGNOSIS — G4733 Obstructive sleep apnea (adult) (pediatric): Secondary | ICD-10-CM

## 2024-10-29 DIAGNOSIS — I10 Essential (primary) hypertension: Secondary | ICD-10-CM

## 2024-10-29 DIAGNOSIS — Z8679 Personal history of other diseases of the circulatory system: Secondary | ICD-10-CM

## 2024-10-29 DIAGNOSIS — J4520 Mild intermittent asthma, uncomplicated: Secondary | ICD-10-CM

## 2024-10-29 LAB — CBC
HCT: 39 % (ref 39.0–52.0)
Hemoglobin: 13.3 g/dL (ref 13.0–17.0)
MCH: 32.5 pg (ref 26.0–34.0)
MCHC: 34.1 g/dL (ref 30.0–36.0)
MCV: 95.4 fL (ref 80.0–100.0)
Platelets: 212 K/uL (ref 150–400)
RBC: 4.09 MIL/uL — ABNORMAL LOW (ref 4.22–5.81)
RDW: 13.4 % (ref 11.5–15.5)
WBC: 7.2 K/uL (ref 4.0–10.5)
nRBC: 0 % (ref 0.0–0.2)

## 2024-10-29 LAB — COMPREHENSIVE METABOLIC PANEL WITH GFR
ALT: 6 U/L (ref 0–44)
AST: 14 U/L — ABNORMAL LOW (ref 15–41)
Albumin: 2.1 g/dL — ABNORMAL LOW (ref 3.5–5.0)
Alkaline Phosphatase: 43 U/L (ref 38–126)
Anion gap: 6 (ref 5–15)
BUN: 8 mg/dL (ref 8–23)
CO2: 18 mmol/L — ABNORMAL LOW (ref 22–32)
Calcium: 6.3 mg/dL — CL (ref 8.9–10.3)
Chloride: 117 mmol/L — ABNORMAL HIGH (ref 98–111)
Creatinine, Ser: 0.69 mg/dL (ref 0.61–1.24)
GFR, Estimated: >60 mL/min (ref >60)
Glucose, Bld: 83 mg/dL (ref 70–99)
Potassium: 3.7 mmol/L (ref 3.5–5.1)
Sodium: 141 mmol/L (ref 135–145)
Total Bilirubin: 0.6 mg/dL (ref 0.0–1.2)
Total Protein: 4 g/dL — ABNORMAL LOW (ref 6.5–8.1)

## 2024-10-29 LAB — VITAMIN D 25 HYDROXY (VIT D DEFICIENCY, FRACTURES): Vit D, 25-Hydroxy: 36.67 ng/mL (ref 30–100)

## 2024-10-29 LAB — TROPONIN I (HIGH SENSITIVITY): Troponin I (High Sensitivity): 12 ng/L (ref <18)

## 2024-10-29 LAB — MAGNESIUM: Magnesium: 1.6 mg/dL — ABNORMAL LOW (ref 1.7–2.4)

## 2024-10-29 MED ORDER — ONDANSETRON HCL 4 MG PO TABS: 4.0000 mg | ORAL_TABLET | Freq: Four times a day (QID) | ORAL | Status: DC | PRN

## 2024-10-29 MED ORDER — NALOXONE HCL 0.4 MG/ML IJ SOLN: 0.4000 mg | INTRAMUSCULAR | Status: DC | PRN

## 2024-10-29 MED ORDER — ONDANSETRON HCL 4 MG/2ML IJ SOLN: 4.0000 mg | Freq: Four times a day (QID) | INTRAMUSCULAR | Status: DC | PRN

## 2024-10-29 MED ORDER — IOHEXOL 350 MG/ML SOLN
75.0000 mL | Freq: Once | INTRAVENOUS | Status: AC | PRN
  Administered 2024-10-29: 75 mL via INTRAVENOUS

## 2024-10-29 MED ORDER — SODIUM CHLORIDE 0.9 % IV SOLN: INTRAVENOUS | Status: AC

## 2024-10-29 MED ORDER — ENSURE PLUS HIGH PROTEIN PO LIQD: 237.0000 mL | Freq: Two times a day (BID) | ORAL | Status: DC

## 2024-10-29 MED ORDER — RANOLAZINE ER 500 MG PO TB12
1000.0000 mg | ORAL_TABLET | Freq: Two times a day (BID) | ORAL | Status: DC
  Administered 2024-10-29 – 2024-10-31 (×5): 1000 mg via ORAL
  Filled 2024-10-29 (×5): qty 2

## 2024-10-29 MED ORDER — SODIUM CHLORIDE 0.9 % IV BOLUS: 1000.0000 mL | Freq: Once | INTRAVENOUS | Status: DC

## 2024-10-29 MED ORDER — SODIUM CHLORIDE 0.9% FLUSH: 3.0000 mL | INTRAVENOUS | Status: DC | PRN

## 2024-10-29 MED ORDER — SODIUM CHLORIDE 0.9 % IV BOLUS
1000.0000 mL | INTRAVENOUS | Status: AC
  Administered 2024-10-29: 1000 mL via INTRAVENOUS

## 2024-10-29 MED ORDER — ALBUTEROL SULFATE (2.5 MG/3ML) 0.083% IN NEBU: 2.5000 mg | INHALATION_SOLUTION | Freq: Four times a day (QID) | RESPIRATORY_TRACT | Status: DC | PRN

## 2024-10-29 MED ORDER — SODIUM CHLORIDE 0.9% FLUSH
3.0000 mL | Freq: Two times a day (BID) | INTRAVENOUS | Status: DC
  Administered 2024-10-29 – 2024-10-30 (×5): 3 mL via INTRAVENOUS

## 2024-10-29 MED ORDER — SODIUM CHLORIDE 0.9 % IV SOLN: 250.0000 mL | INTRAVENOUS | Status: AC | PRN

## 2024-10-29 MED ORDER — TICAGRELOR 90 MG PO TABS
90.0000 mg | ORAL_TABLET | Freq: Two times a day (BID) | ORAL | Status: DC
  Administered 2024-10-29 – 2024-10-31 (×5): 90 mg via ORAL
  Filled 2024-10-29 (×5): qty 1

## 2024-10-29 MED ORDER — ENOXAPARIN SODIUM 40 MG/0.4ML IJ SOSY
40.0000 mg | PREFILLED_SYRINGE | INTRAMUSCULAR | Status: DC
  Filled 2024-10-29 (×2): qty 0.4

## 2024-10-29 MED ORDER — ASPIRIN 81 MG PO TBEC
81.0000 mg | DELAYED_RELEASE_TABLET | Freq: Every day | ORAL | Status: DC
  Administered 2024-10-29 – 2024-10-31 (×3): 81 mg via ORAL
  Filled 2024-10-29 (×3): qty 1

## 2024-10-29 MED ORDER — MAGNESIUM SULFATE 2 GM/50ML IV SOLN
2.0000 g | Freq: Once | INTRAVENOUS | Status: AC
  Administered 2024-10-29: 2 g via INTRAVENOUS
  Filled 2024-10-29: qty 50

## 2024-10-29 MED ORDER — NITROGLYCERIN 0.4 MG SL SUBL: 0.4000 mg | SUBLINGUAL_TABLET | SUBLINGUAL | Status: DC | PRN

## 2024-10-29 MED ORDER — HALOPERIDOL LACTATE 5 MG/ML IJ SOLN: 1.0000 mg | Freq: Once | INTRAMUSCULAR | Status: DC

## 2024-10-29 MED ORDER — NALOXONE HCL 0.4 MG/ML IJ SOLN
0.4000 mg | Freq: Once | INTRAMUSCULAR | Status: AC
  Administered 2024-10-29: 0.4 mg via INTRAVENOUS
  Filled 2024-10-29: qty 1

## 2024-10-29 MED ORDER — CALCIUM GLUCONATE-NACL 2-0.675 GM/100ML-% IV SOLN
2.0000 g | Freq: Once | INTRAVENOUS | Status: AC
  Administered 2024-10-29: 2000 mg via INTRAVENOUS
  Filled 2024-10-29: qty 100
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
## 2024-10-29 NOTE — ED Notes (Signed)
 IVC verified and current on the clipboard in the blue zone.  [place] [address] [facility] will be contacted to obtain the case number.

## 2024-10-29 NOTE — Plan of Care (Signed)
   Problem: Education: Goal: Knowledge of General Education information will improve Description: Including pain rating scale, medication(s)/side effects and non-pharmacologic comfort measures Outcome: Progressing   Problem: Health Behavior/Discharge Planning: Goal: Ability to manage health-related needs will improve Outcome: Progressing   Problem: Clinical Measurements: Goal: Diagnostic test results will improve Outcome: Progressing
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
## 2024-10-29 NOTE — ED Notes (Signed)
 Poison control called and cleared on their end. Jeana from poison control stated they will close out no further concerns.

## 2024-10-29 NOTE — Telephone Encounter (Signed)
 Noted. Have been following the situation.

## 2024-10-29 NOTE — ED Notes (Signed)
 This phlebotomist stuck patient twice unable to obtain blood work

## 2024-10-29 NOTE — Telephone Encounter (Signed)
 He saw his cardiologist last week. He needs to see psychiatry.
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
## 2024-10-30 DIAGNOSIS — T50902A Poisoning by unspecified drugs, medicaments and biological substances, intentional self-harm, initial encounter: Secondary | ICD-10-CM | POA: Diagnosis not present

## 2024-10-30 LAB — RAPID URINE DRUG SCREEN, HOSP PERFORMED
Amphetamines: NOT DETECTED
Barbiturates: NOT DETECTED
Benzodiazepines: POSITIVE — AB
Cocaine: NOT DETECTED
Opiates: NOT DETECTED
Tetrahydrocannabinol: NOT DETECTED

## 2024-10-30 LAB — BASIC METABOLIC PANEL WITH GFR
Anion gap: 6 (ref 5–15)
BUN: 7 mg/dL — ABNORMAL LOW (ref 8–23)
CO2: 25 mmol/L (ref 22–32)
Calcium: 8.7 mg/dL — ABNORMAL LOW (ref 8.9–10.3)
Chloride: 109 mmol/L (ref 98–111)
Creatinine, Ser: 1.04 mg/dL (ref 0.61–1.24)
GFR, Estimated: >60 mL/min (ref >60)
Glucose, Bld: 95 mg/dL (ref 70–99)
Potassium: 4.2 mmol/L (ref 3.5–5.1)
Sodium: 140 mmol/L (ref 135–145)

## 2024-10-30 LAB — CBC WITH DIFFERENTIAL/PLATELET
Abs Immature Granulocytes: 0.01 K/uL (ref 0.00–0.07)
Basophils Absolute: 0 K/uL (ref 0.0–0.1)
Basophils Relative: 1 %
Eosinophils Absolute: 0.2 K/uL (ref 0.0–0.5)
Eosinophils Relative: 4 %
HCT: 41 % (ref 39.0–52.0)
Hemoglobin: 13.7 g/dL (ref 13.0–17.0)
Immature Granulocytes: 0 %
Lymphocytes Relative: 18 %
Lymphs Abs: 1.2 K/uL (ref 0.7–4.0)
MCH: 32.3 pg (ref 26.0–34.0)
MCHC: 33.4 g/dL (ref 30.0–36.0)
MCV: 96.7 fL (ref 80.0–100.0)
Monocytes Absolute: 0.5 K/uL (ref 0.1–1.0)
Monocytes Relative: 8 %
Neutro Abs: 4.9 K/uL (ref 1.7–7.7)
Neutrophils Relative %: 69 %
Platelets: 222 K/uL (ref 150–400)
RBC: 4.24 MIL/uL (ref 4.22–5.81)
RDW: 13.3 % (ref 11.5–15.5)
WBC: 6.9 K/uL (ref 4.0–10.5)
nRBC: 0 % (ref 0.0–0.2)

## 2024-10-30 LAB — RESPIRATORY PANEL BY PCR

## 2024-10-30 LAB — URINALYSIS, ROUTINE W REFLEX MICROSCOPIC
Bilirubin Urine: NEGATIVE
Glucose, UA: NEGATIVE mg/dL
Hgb urine dipstick: NEGATIVE
Ketones, ur: 15 mg/dL — AB
Leukocytes,Ua: NEGATIVE
Nitrite: NEGATIVE
Protein, ur: NEGATIVE mg/dL
Specific Gravity, Urine: 1.015 (ref 1.005–1.030)
pH: 7 (ref 5.0–8.0)

## 2024-10-30 LAB — CALCIUM, IONIZED: Calcium, Ionized, Serum: 5.3 mg/dL (ref 4.5–5.6)

## 2024-10-30 LAB — MAGNESIUM: Magnesium: 1.9 mg/dL (ref 1.7–2.4)

## 2024-10-30 LAB — SARS CORONAVIRUS 2 BY RT PCR: SARS Coronavirus 2 by RT PCR: NEGATIVE

## 2024-10-30 MED ORDER — ALPRAZOLAM 0.5 MG PO TABS
0.5000 mg | ORAL_TABLET | Freq: Two times a day (BID) | ORAL | Status: DC
  Administered 2024-10-30 – 2024-10-31 (×3): 0.5 mg via ORAL
  Filled 2024-10-30 (×3): qty 1

## 2024-10-30 NOTE — Progress Notes (Signed)
 Called Sheriff at [phone] to no avail. Left voicemail, awaiting call back
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
## 2024-10-30 NOTE — Progress Notes (Signed)
 Called Sheriff @ [phone] twice for transport. Left voice mail; no return call, Patient daughter called and made aware patient remains to the unit; awaiting Sheriff pick up. Juleen Distel, BSN, RN

## 2024-10-30 NOTE — Progress Notes (Signed)
 Report called to Devere Betters, RN @ [phone]. Questions answered and no concerns verbalized. Juleen Distel, BSN, RN.

## 2024-10-30 NOTE — TOC Transition Note (Addendum)
 Transition of Care [facility]) - Discharge Note   Patient Details  Name: Corey [name] MRN: [id number] Date of Birth: 28-May-1961  Transition of Care [facility]) CM/SW Contact:  Isaiah Public, LCSWA Phone Number: 10/30/2024, 3:11 PM   Clinical Narrative:     Patient will DC to: [facility]  Anticipated DC date:- 10/30/2024  Family notified: Patient gave permission to notify his daughter [name]  Transport ab:[employer]- CSW LVM for [employer] - CSW awaiting call back to request pick up. CSW informed RN. CSW informed RN to call again at 5:00pm if CSW does not hear back by then.  ?  Per MD patient ready for DC to [facility] . RN, patient, patient's family, and facility notified of DC. Patient has Psych bed at [facility] , MD Donnelly BUTTE MD RN given number for  report is [phone] .   CSW signing off.    Final next level of care:  (Inpatient psych) Barriers to Discharge: No Barriers Identified   Patient Goals and CMS Choice            Discharge Placement                Patient to be transferred to facility by: [facility] Name of family member notified: [name] Patient and family notified of of transfer: 10/30/24  Discharge Plan and Services Additional resources added to the After Visit Summary for                                       Social Drivers of Health (SDOH) Interventions SDOH Screenings   Food Insecurity: No Food Insecurity (10/29/2024)  Housing: Low Risk  (10/29/2024)  Transportation Needs: No Transportation Needs (10/29/2024)  Utilities: Not At Risk (10/29/2024)  Depression (PHQ2-9): Medium Risk (10/26/2024)  Tobacco Use: Medium Risk (10/29/2024)     Readmission Risk Interventions     No data to display
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
## 2024-10-30 NOTE — Discharge Instructions (Signed)
 Patient is being discharged to [facility] psych facility.

## 2024-10-30 NOTE — Plan of Care (Signed)

## 2024-10-31 ENCOUNTER — Encounter: Payer: Self-pay | Admitting: Addiction Medicine

## 2024-10-31 ENCOUNTER — Other Ambulatory Visit: Payer: Self-pay

## 2024-10-31 ENCOUNTER — Inpatient Hospital Stay
Admission: RE | Admit: 2024-10-31 | Discharge: 2024-11-04 | DRG: 885 | Disposition: A | Source: Intra-hospital | Attending: Psychiatry | Admitting: Psychiatry

## 2024-10-31 DIAGNOSIS — F332 Major depressive disorder, recurrent severe without psychotic features: Principal | ICD-10-CM | POA: Diagnosis present

## 2024-10-31 DIAGNOSIS — G4733 Obstructive sleep apnea (adult) (pediatric): Secondary | ICD-10-CM | POA: Diagnosis not present

## 2024-10-31 DIAGNOSIS — J45909 Unspecified asthma, uncomplicated: Secondary | ICD-10-CM | POA: Diagnosis not present

## 2024-10-31 DIAGNOSIS — T424X2A Poisoning by benzodiazepines, intentional self-harm, initial encounter: Secondary | ICD-10-CM | POA: Diagnosis not present

## 2024-10-31 DIAGNOSIS — N200 Calculus of kidney: Secondary | ICD-10-CM | POA: Diagnosis not present

## 2024-10-31 DIAGNOSIS — Z8679 Personal history of other diseases of the circulatory system: Secondary | ICD-10-CM | POA: Diagnosis not present

## 2024-10-31 DIAGNOSIS — Z7982 Long term (current) use of aspirin: Secondary | ICD-10-CM | POA: Diagnosis not present

## 2024-10-31 DIAGNOSIS — R4182 Altered mental status, unspecified: Secondary | ICD-10-CM | POA: Diagnosis not present

## 2024-10-31 DIAGNOSIS — I10 Essential (primary) hypertension: Secondary | ICD-10-CM | POA: Diagnosis not present

## 2024-10-31 DIAGNOSIS — F419 Anxiety disorder, unspecified: Secondary | ICD-10-CM | POA: Diagnosis not present

## 2024-10-31 DIAGNOSIS — Z79899 Other long term (current) drug therapy: Secondary | ICD-10-CM | POA: Diagnosis not present

## 2024-10-31 MED ORDER — OLANZAPINE 5 MG PO TBDP: 5.0000 mg | ORAL_TABLET | Freq: Three times a day (TID) | ORAL | Status: DC | PRN

## 2024-10-31 MED ORDER — ALPRAZOLAM 0.5 MG PO TABS
0.5000 mg | ORAL_TABLET | Freq: Two times a day (BID) | ORAL | Status: DC
  Administered 2024-10-31 – 2024-11-03 (×6): 0.5 mg via ORAL
  Filled 2024-10-31 (×8): qty 1

## 2024-10-31 MED ORDER — ACETAMINOPHEN 325 MG PO TABS
650.0000 mg | ORAL_TABLET | Freq: Four times a day (QID) | ORAL | Status: DC | PRN
  Administered 2024-11-02: 650 mg via ORAL
  Filled 2024-10-31: qty 2

## 2024-10-31 MED ORDER — ALBUTEROL SULFATE (2.5 MG/3ML) 0.083% IN NEBU: 2.5000 mg | INHALATION_SOLUTION | Freq: Four times a day (QID) | RESPIRATORY_TRACT | Status: DC | PRN

## 2024-10-31 MED ORDER — ONDANSETRON HCL 4 MG/2ML IJ SOLN: 4.0000 mg | Freq: Four times a day (QID) | INTRAMUSCULAR | Status: DC | PRN

## 2024-10-31 MED ORDER — TICAGRELOR 90 MG PO TABS
90.0000 mg | ORAL_TABLET | Freq: Two times a day (BID) | ORAL | Status: DC
  Administered 2024-10-31 – 2024-11-04 (×8): 90 mg via ORAL
  Filled 2024-10-31 (×8): qty 1

## 2024-10-31 MED ORDER — VITAMIN D 25 MCG (1000 UNIT) PO TABS
5000.0000 [IU] | ORAL_TABLET | Freq: Every day | ORAL | Status: DC
  Administered 2024-10-31 – 2024-11-04 (×5): 5000 [IU] via ORAL
  Filled 2024-10-31 (×5): qty 5

## 2024-10-31 MED ORDER — RANOLAZINE ER 500 MG PO TB12
1000.0000 mg | ORAL_TABLET | Freq: Two times a day (BID) | ORAL | Status: DC
  Administered 2024-10-31 – 2024-11-04 (×8): 1000 mg via ORAL
  Filled 2024-10-31 (×8): qty 2

## 2024-10-31 MED ORDER — NITROGLYCERIN 0.4 MG SL SUBL: 0.4000 mg | SUBLINGUAL_TABLET | SUBLINGUAL | Status: DC | PRN

## 2024-10-31 MED ORDER — ENSURE PLUS HIGH PROTEIN PO LIQD: 237.0000 mL | Freq: Two times a day (BID) | ORAL | Status: DC

## 2024-10-31 MED ORDER — ALUM & MAG HYDROXIDE-SIMETH 200-200-20 MG/5ML PO SUSP: 30.0000 mL | ORAL | Status: DC | PRN

## 2024-10-31 MED ORDER — ONDANSETRON HCL 4 MG PO TABS: 4.0000 mg | ORAL_TABLET | Freq: Four times a day (QID) | ORAL | Status: DC | PRN

## 2024-10-31 MED ORDER — ASPIRIN 81 MG PO TBEC
81.0000 mg | DELAYED_RELEASE_TABLET | Freq: Every day | ORAL | Status: DC
  Administered 2024-11-01 – 2024-11-04 (×4): 81 mg via ORAL
  Filled 2024-10-31 (×4): qty 1

## 2024-10-31 NOTE — Group Note (Signed)
 Date:  10/31/2024 Time:  3:29 PM  Group Topic/Focus:  Movement Therapy    Participation Level:  Active  Participation Quality:  Appropriate  Affect:  Appropriate  Cognitive:  Appropriate  Insight: Appropriate  Engagement in Group:  Engaged  Modes of Intervention:  Activity  Additional Comments:  none  Norleen SHAUNNA Bias 10/31/2024, 3:29 PM

## 2024-10-31 NOTE — Plan of Care (Signed)

## 2024-10-31 NOTE — Group Note (Signed)
 Date:  10/31/2024 Time:  11:08 AM  Group Topic/Focus:  Coping With Mental Health Crisis:   The purpose of this group is to help patients identify strategies for coping with mental health crisis.  Group discusses possible causes of crisis and ways to manage them effectively.    Participation Level:  Did Not Attend   Additional Comments:  Pt new unit and was still in exam room   Maglione,Kennedy Bohanon E 10/31/2024, 11:08 AM

## 2024-10-31 NOTE — Progress Notes (Signed)
 Report called to Eleanor Flemings, RN. Questions answered and concerns clarified. Patient transferred out ambulatory, in stable condition. Sheriff escorted. Juleen Distel, BSN, RN
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
## 2024-10-31 NOTE — Plan of Care (Signed)
  Problem: Education: Goal: Ability to make informed decisions regarding treatment will improve Outcome: Progressing   Problem: Coping: Goal: Coping ability will improve Outcome: Progressing   Problem: Medication: Goal: Compliance with prescribed medication regimen will improve Outcome: Progressing   

## 2024-10-31 NOTE — Plan of Care (Signed)
   Problem: Coping: Goal: Coping ability will improve Outcome: Progressing

## 2024-10-31 NOTE — Progress Notes (Signed)
   10/31/24 2000  Psych Admission Type (Psych Patients Only)  Admission Status Involuntary  Psychosocial Assessment  Patient Complaints None  Eye Contact Fair  Facial Expression Anxious  Affect Appropriate to circumstance  Speech Logical/coherent  Interaction Assertive  Motor Activity Other (Comment) (WNL)  Appearance/Hygiene Unremarkable  Behavior Characteristics Cooperative;Appropriate to situation;Calm  Mood Pleasant  Thought Process  Coherency WDL  Content WDL  Delusions None reported or observed  Perception WDL  Hallucination None reported or observed  Judgment WDL  Confusion None  Danger to Self  Current suicidal ideation? Denies  Danger to Others  Danger to Others None reported or observed

## 2024-10-31 NOTE — Tx Team (Signed)
 Initial Treatment Plan 10/31/2024 11:56 AM [name] [id number]    PATIENT STRESSORS: Marital or family conflict   Other: Recent Breakup      PATIENT STRENGTHS: Ability for insight  Active sense of humor  Communication skills  Supportive family/friends    PATIENT IDENTIFIED PROBLEMS:      Anxiety                  DISCHARGE CRITERIA:  Ability to meet basic life and health needs Improved stabilization in mood, thinking, and/or behavior  PRELIMINARY DISCHARGE PLAN: Return to previous living arrangement  PATIENT/FAMILY INVOLVEMENT: This treatment plan has been presented to and reviewed with the patient, Corey [name]  The patient and family have been given the opportunity to ask questions and make suggestions.  Aeric Burnham B Icarus Partch, RN 10/31/2024, 11:56 AM

## 2024-11-01 DIAGNOSIS — F332 Major depressive disorder, recurrent severe without psychotic features: Principal | ICD-10-CM

## 2024-11-01 NOTE — Group Note (Signed)
 Date:  11/01/2024 Time:  5:21 PM  Group Topic/Focus:  Self Care:   The focus of this group is to help patients understand the importance of self-care in order to improve or restore emotional, physical, spiritual, interpersonal, and financial health.    Participation Level:  Active  Participation Quality:  Appropriate and Attentive  Affect:  Appropriate  Cognitive:  Alert and Appropriate  Insight: Appropriate  Engagement in Group:  Engaged  Modes of Intervention:  Orientation  Additional Comments:     Maglione,Delmos Velaquez E 11/01/2024, 5:21 PM

## 2024-11-01 NOTE — Group Note (Signed)
 Date:  11/01/2024 Time:  10:40 AM  Group Topic/Focus:  Goals/Emotion Regulation Group:   The focus of this group is to help patients establish daily goals to achieve during treatment and discuss how the patient can incorporate goal setting into their daily lives to aide in recovery. Pt also learned what emotion regulation was and shared their thoughts and opinion on the topic.   Participation Level:  Active  Participation Quality:  Appropriate  Affect:  Appropriate  Cognitive:  Appropriate  Insight: Appropriate  Engagement in Group:  Engaged  Modes of Intervention:  Activity and Discussion  Additional Comments:    Corey [name] 11/01/2024, 10:40 AM
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
## 2024-11-01 NOTE — Plan of Care (Signed)
  Problem: Education: Goal: Ability to make informed decisions regarding treatment will improve 11/01/2024 2312 by Addilee Neu, Shanda ORN, RN Outcome: Progressing  Problem: Coping: Goal: Coping ability will improve 11/01/2024 2312 by Hilarie Sinha, Shanda ORN, RN Outcome: Progressing

## 2024-11-01 NOTE — Progress Notes (Signed)
   11/01/24 2200  Psych Admission Type (Psych Patients Only)  Admission Status Involuntary  Psychosocial Assessment  Patient Complaints None  Eye Contact Fair  Facial Expression Anxious  Affect Appropriate to circumstance  Speech Logical/coherent  Interaction Assertive  Motor Activity Slow  Appearance/Hygiene Unremarkable  Behavior Characteristics Cooperative  Mood Pleasant  Thought Process  Coherency WDL  Content WDL  Delusions None reported or observed  Perception WDL  Hallucination None reported or observed  Judgment WDL  Confusion None  Danger to Self  Current suicidal ideation? Denies  Danger to Others  Danger to Others None reported or observed

## 2024-11-01 NOTE — Group Note (Signed)
 Date:  11/01/2024 Time:  9:12 PM  Group Topic/Focus:  Wrap-Up Group:   The focus of this group is to help patients review their daily goal of treatment and discuss progress on daily workbooks.    Participation Level:  Active  Participation Quality:  Appropriate  Affect:  Appropriate  Cognitive:  Alert  Insight: Appropriate  Engagement in Group:  Engaged  Modes of Intervention:  Discussion  Additional Comments:    Corey [name] [name] 11/01/2024, 9:12 PM

## 2024-11-01 NOTE — [facility] Suicide Risk Assessment (Signed)
 [facility] INPATIENT:  Family/Significant Other Suicide Prevention Education  Suicide Prevention Education:  Education Completed; Wadie Liew, daughter, [phone] been identified by the patient as the family member/significant other with whom the patient will be residing, and identified as the person(s) who will aid the patient in the event of a mental health crisis (suicidal ideations/suicide attempt).  With written consent from the patient, the family member/significant other has been provided the following suicide prevention education, prior to the and/or following the discharge of the patient.  The suicide prevention education provided includes the following: Suicide risk factors Suicide prevention and interventions National Suicide Hotline telephone number [facility] assessment telephone number [facility] Emergency Assistance 911 [facility] and/or Residential Mobile Crisis Unit telephone number  Request made of family/significant other to: Remove weapons (e.g., guns, rifles, knives), all items previously/currently identified as safety concern.   Remove drugs/medications (over-the-counter, prescriptions, illicit drugs), all items previously/currently identified as a safety concern.  The family member/significant other verbalizes understanding of the suicide prevention education information provided.  The family member/significant other agrees to remove the items of safety concern listed above.  Rexene LELON Mae 11/01/2024, 5:49 PM

## 2024-11-01 NOTE — Progress Notes (Signed)
   11/01/24 0900  Psych Admission Type (Psych Patients Only)  Admission Status Involuntary  Psychosocial Assessment  Patient Complaints None  Eye Contact Fair  Facial Expression Other (Comment) (appropriate)  Affect Appropriate to circumstance  Speech Logical/coherent  Interaction Assertive  Motor Activity Slow  Appearance/Hygiene Unremarkable  Behavior Characteristics Cooperative  Mood Pleasant  Thought Process  Coherency WDL  Content WDL  Delusions None reported or observed  Perception WDL  Hallucination None reported or observed  Judgment WDL  Confusion None  Danger to Self  Current suicidal ideation? Denies  Danger to Others  Danger to Others None reported or observed

## 2024-11-01 NOTE — Group Note (Signed)
 LCSW Group Therapy Note  Group Date: 11/01/2024 Start Time: 1045 End Time: 1130   Type of Therapy and Topic:  Group Therapy: Using I Statements  Participation Level:  Active  Description of Group:  Patients were asked to provide details of some interpersonal conflicts they have experienced. Patients were then educated about "I" statements, communication which focuses on feelings or views of the speaker rather than what the other person is doing. T group members were asked to reflect on past conflicts and to provide specific examples for utilizing "I" statements.  Therapeutic Goals:  Patients will verbalize understanding of ineffective communication and effective communication. Patients will be able to empathize with whom they are having conflict. Patients will practice effective communication in the form of "I" statements.    Summary of Patient Progress:The patient was present/active throughout the session and proved open to feedback from CSW and peers. Patient demonstrated helpful insight into the subject matter, was respectful of peers, and was present throughout the entire session.  Therapeutic Modalities:   Cognitive Behavioral Therapy Solution-Focused Therapy    Rexene LELON Chesley KEN 11/01/2024  12:38 PM

## 2024-11-01 NOTE — [facility] Suicide Risk Assessment (Signed)
 [facility] Admission Suicide Risk Assessment   Nursing information obtained from:  Patient Demographic factors:  Male, Divorced or widowed Current Mental Status:  NA Loss Factors:  Loss of significant relationship Historical Factors:  NA Risk Reduction Factors:  Employed, Positive social support  Total Time spent with patient: 30 minutes Principal Problem: Major depressive disorder, recurrent severe without psychotic features (HCC) Diagnosis:  Principal Problem:   Major depressive disorder, recurrent severe without psychotic features (HCC)  Subjective Data: Corey [name] is a 63 y.o. male Presented to the ED on 10/28/2024  8:37 PM following an intentional overdose on Xanax . He carries the psychiatric diagnoses of Major Depressive Disorder and has a past medical history of CAD s/p cardiac stent, essential hypertension, reactive airway disease, hyperlipidemia, generalized anxiety disorder, GERD, hypogonadism in male, morbid obesity and obstructive sleep apnea on CPAP. Patient's current presentation of low mood, irritability, insomnia, and feelings of worthlessness culminating in an intentional medication overdose are consistent with a major depressive episode.Patient is admitted to [facility] unit with Q15 min safety monitoring. Multidisciplinary team approach is offered. Medication management; group/milieu therapy is offered.   Continued Clinical Symptoms:  Alcohol Use Disorder Identification Test Final Score (AUDIT): 1 The Alcohol Use Disorders Identification Test, Guidelines for Use in Primary Care, Second Edition.  [employer] [facility]). Score between 0-7:  no or low risk or alcohol related problems. Score between 8-15:  moderate risk of alcohol related problems. Score between 16-19:  high risk of alcohol related problems. Score 20 or above:  warrants further diagnostic evaluation for alcohol dependence and treatment.   CLINICAL FACTORS:   Depression:    Hopelessness   Musculoskeletal: Strength & Muscle Tone: within normal limits Gait & Station: normal Patient leans: N/A  Psychiatric Specialty Exam:  Presentation  General Appearance: Appropriate for Environment  Eye Contact:Fair  Speech:Clear and Coherent  Speech Volume:Normal  Handedness:Right   Mood and Affect  Mood:Anxious  Affect:Appropriate   Thought Process  Thought Processes:Coherent  Descriptions of Associations:Intact  Orientation:Full (Time, Place and Person)  Thought Content:Logical  History of Schizophrenia/Schizoaffective disorder:No data recorded Duration of Psychotic Symptoms:No data recorded Hallucinations:Hallucinations: None  Ideas of Reference:None  Suicidal Thoughts:Suicidal Thoughts: No  Homicidal Thoughts:Homicidal Thoughts: No   Sensorium  Memory:Immediate Fair; Remote Fair  Judgment:Impaired  Insight:Shallow   Executive Functions  Concentration:Fair  Attention Span:Poor  Recall:Fair  Fund of Knowledge:Fair  Language:Fair   Psychomotor Activity  Psychomotor Activity:Psychomotor Activity: Normal   Assets  Assets:Communication Skills; Desire for Improvement   Sleep  Sleep:Sleep: Fair    Physical Exam: Physical Exam ROS Blood pressure 125/79, pulse 71, temperature 98.4 F (36.9 C), resp. rate 16, height 5' 5 (1.651 m), weight 89.1 kg, SpO2 97%. Body mass index is 32.7 kg/m.   COGNITIVE FEATURES THAT CONTRIBUTE TO RISK:  None    SUICIDE RISK:   Minimal: No identifiable suicidal ideation.  Patients presenting with no risk factors but with morbid ruminations; may be classified as minimal risk based on the severity of the depressive symptoms  PLAN OF CARE: Patient is admitted to [facility] psych unit with Q15 min safety monitoring. Multidisciplinary team approach is offered. Medication management; group/milieu therapy is offered.   I certify that inpatient services furnished can reasonably be expected to  improve the patient's condition.   Allyn Foil, MD 11/01/2024, 11:10 PM

## 2024-11-01 NOTE — H&P (Signed)
 Psychiatric Admission Assessment Adult  Patient Identification: LIEUTENANT ABARCA MRN:  [id number] Date of Evaluation:  11/01/2024 Chief Complaint:  Major depressive disorder, recurrent severe without psychotic features (HCC) [F33.2]   History of Present Illness: Corey [name] is a 63 y.o. male Presented to the ED on 10/28/2024  8:37 PM following an intentional overdose on Xanax . He carries the psychiatric diagnoses of Major Depressive Disorder and has a past medical history of CAD s/p cardiac stent, essential hypertension, reactive airway disease, hyperlipidemia, generalized anxiety disorder, GERD, hypogonadism in male, morbid obesity and obstructive sleep apnea on CPAP. Patient's current presentation of low mood, irritability, insomnia, and feelings of worthlessness culminating in an intentional medication overdose are consistent with a major depressive episode.Patient is admitted to [facility] unit with Q15 min safety monitoring. Multidisciplinary team approach is offered. Medication management; group/milieu therapy is offered.   On interview patient is noted to be resting in a chair.  He was able to acknowledge ongoing depression and anxiety about his situation leading up to overdose on Xanax  and hospitalization.  He reports his wife passed away few years ago after struggling with cancer 4 months.  He reports getting into a relationship with her his girlfriend and was reportedly having good time.  He reports in October they went for a trip and after coming back from the trip the girlfriend out of nowhere told him to move out and blocked him with no given reason.  Patient reports that he was stressed out so much that he had a heart attack and was admitted medically.  Since a heart attack in November 1 week in the treatment patient reports never going back to his baseline.  He reports having crying spells, mood swings, irritability.  He reports last week getting into an argument with his daughter about  his relationship with a girlfriend and how he is behaving in the loss of the relationship all erratic.  Patient reports that his daughter told him not to go around his grandson until he stabilizes in his mood.  Patient reports it day after the argument he reportedly felt hopeless and worthless, led up to taking overdose on Xanax .  He endorses depression, feeling hopeless worthless, crying spells, poor appetite, voluntary weight loss.  Patient currently denies SI/HI/plan.  He reports being anxious about his situation and panic attacks.  He denies auditory/visual hallucinations.  He denies current symptoms of mania/hypomania.  He denies nightmares or flashbacks about any traumatic events Total Time spent with patient: 1 hour Sleep  Sleep:Sleep: Fair  Past Psychiatric History:  Psychiatric History:  Information collected from patient/chart  Prev Dx/Sx: MDD Current Psych Provider:  Home Meds (current): Xanax  1 mg TID, Remeron  3.75 mg qhs Previous Med Trials: Zoloft, Prozac , hydroxyzine . Patient's daughter reports patient became psychotic while taking SSRI's. Therapy: Yes, recently started seeing a therapist   Prior Psych Hospitalization: Denies  Prior Self Harm: Denies Prior Violence: Denies   Family Psych History: Denies Family Hx suicide: Denies   Social History:  Patient is widowed. His wife passed away from COVID in November 20, 2020. States she was in the hospital for 63 days prior to passing away. Patient's mom lives with him. Patient works designer, television/film set. States after having heart surgery, he has been on short term disability.    Access to weapons/lethal means: Yes, guns but reports they are locked in a safe.    Substance History States he takes THC gummies to help with his anxiety. Patient denies other substance use history. UDS  was unremarkable. Is the patient at risk to self? Yes.    Has the patient been a risk to self in the past 6 months? No.  Has the patient been a risk to self  within the distant past? No.  Is the patient a risk to others? No.  Has the patient been a risk to others in the past 6 months? No.  Has the patient been a risk to others within the distant past? No.   Columbia Scale:  Flowsheet Row Admission (Current) from 10/31/2024 in [facility] ED to Hosp-Admission (Discharged) from 10/28/2024 in [facility] ED to Hosp-Admission (Discharged) from 09/25/2024 in [facility]  C-SSRS RISK CATEGORY No Risk No Risk No Risk     Past Medical History:  Past Medical History:  Diagnosis Date   Angina pectoris 06/04/2019   Arthritis    Benign essential HTN 02/10/2016   Bronchospasm with bronchitis, acute 12/14/2013   Coronary artery disease status post PTCA and drug-eluting stent to circumflex artery in July 2020 06/26/2019   Dyslipidemia 09/24/2020   GAD (generalized anxiety disorder) 12/14/2013   GERD (gastroesophageal reflux disease)    occ   History of kidney stones    Hypertension 12/14/2013   Hypogonadism male 12/14/2013   Kidney stones 02/10/2016   Myelopathy (HCC) 06/03/2014   Obesity    OSA (obstructive sleep apnea) 02/10/2016   Shortness of breath    occ-allergies   Sleep apnea    cpap 16 yrs    Past Surgical History:  Procedure Laterality Date   ANTERIOR CERVICAL DECOMP/DISCECTOMY FUSION N/A 06/03/2014   Procedure: ANTERIOR CERVICAL DECOMPRESSION FUSION, CERVICAL FIVE-SIX, CERVICAL SIX-SEVEN WITH INSTRUMENTATION AND ALLOGRAFT;  Surgeon: Oneil Rodgers Priestly, MD;  Location: [facility] OR;  Service: Orthopedics;  Laterality: N/A;   CORONARY IMAGING/OCT N/A 09/28/2024   Procedure: CORONARY IMAGING/OCT;  Surgeon: [name], MD;  Location: [facility] INVASIVE CV LAB;  Service: Cardiovascular;  Laterality: N/A;   CORONARY STENT INTERVENTION N/A 06/19/2019   Procedure: CORONARY STENT INTERVENTION;  Surgeon: Jordan, Peter M, MD;  Location: [facility] INVASIVE CV LAB;  Service: Cardiovascular;  Laterality: N/A;    CORONARY STENT INTERVENTION N/A 09/28/2024   Procedure: CORONARY STENT INTERVENTION;  Surgeon: [name], MD;  Location: [facility] INVASIVE CV LAB;  Service: Cardiovascular;  Laterality: N/A;   I & D EXTREMITY Left 11/10/2018   Procedure: IRRIGATION AND DEBRIDEMENT EXTREMITY, REVISION AMPUTATION OF LEFT RING FINGER;  Surgeon: [name], MD;  Location: [facility] OR;  Service: Orthopedics;  Laterality: Left;   LEFT HEART CATH AND CORONARY ANGIOGRAPHY N/A 06/19/2019   Procedure: LEFT HEART CATH AND CORONARY ANGIOGRAPHY;  Surgeon: Jordan, Peter M, MD;  Location: [facility] INVASIVE CV LAB;  Service: Cardiovascular;  Laterality: N/A;   LEFT HEART CATH AND CORONARY ANGIOGRAPHY N/A 09/28/2024   Procedure: LEFT HEART CATH AND CORONARY ANGIOGRAPHY;  Surgeon: [name], MD;  Location: [facility] INVASIVE CV LAB;  Service: Cardiovascular;  Laterality: N/A;   URETHRAL DILATION     child   Family History:  Family History  Problem Relation Age of Onset   Diabetes Mother    Arrhythmia Mother        afib   Heart failure Mother    CAD Father 63       CABG with redo   Healthy Daughter     Social History:  Social History   Substance and Sexual Activity  Alcohol Use Yes   Comment: rare  Social History   Substance and Sexual Activity  Drug Use No      Allergies:   Allergies  Allergen Reactions   Atorvastatin  Other (See Comments)    Myalgias   Prozac  [Fluoxetine  Hcl] Other (See Comments)    Suicidal ideation   Rosuvastatin  Other (See Comments)    Myalgias   Lab Results:  No results found for this or any previous visit (from the past 48 hours).   Blood Alcohol level:  Lab Results  Component Value Date   [facility] <15 10/28/2024    Metabolic Disorder Labs:  Lab Results  Component Value Date   HGBA1C 5.1 09/27/2024   MPG 99.67 09/27/2024   No results found for: PROLACTIN Lab Results  Component Value Date   CHOL 169 09/26/2024   TRIG 96 09/26/2024   HDL 37 (L) 09/26/2024   CHOLHDL 4.6  09/26/2024   VLDL 19 09/26/2024   LDLCALC 113 (H) 09/26/2024   LDLCALC 88 03/02/2024    Current Medications: Current Facility-Administered Medications  Medication Dose Route Frequency Provider Last Rate Last Admin   acetaminophen  (TYLENOL ) tablet 650 mg  650 mg Oral Q6H PRN Jacquetta Sharlot GRADE, NP       albuterol  (PROVENTIL ) (2.5 MG/3ML) 0.083% nebulizer solution 2.5 mg  2.5 mg Inhalation Q6H PRN Jacquetta Sharlot GRADE, NP       ALPRAZolam  (XANAX ) tablet 0.5 mg  0.5 mg Oral BID Jacquetta Sharlot GRADE, NP   0.5 mg at 11/01/24 2134   alum & mag hydroxide-simeth (MAALOX/MYLANTA) 200-200-20 MG/5ML suspension 30 mL  30 mL Oral Q4H PRN Lord, Jamison Y, NP       aspirin  EC tablet 81 mg  81 mg Oral Daily Jacquetta Sharlot GRADE, NP   81 mg at 11/01/24 0855   cholecalciferol  (VITAMIN D3) 25 MCG (1000 UNIT) tablet 5,000 Units  5,000 Units Oral Daily Jacquetta Sharlot GRADE, NP   5,000 Units at 11/01/24 0857   feeding supplement (ENSURE PLUS HIGH PROTEIN) liquid 237 mL  237 mL Oral BID BM Lord, Jamison Y, NP       nitroGLYCERIN  (NITROSTAT ) SL tablet 0.4 mg  0.4 mg Sublingual Q5 min PRN Lord, Jamison Y, NP       OLANZapine  zydis (ZYPREXA ) disintegrating tablet 5 mg  5 mg Oral TID PRN Lord, Jamison Y, NP       ondansetron  (ZOFRAN ) tablet 4 mg  4 mg Oral Q6H PRN Jacquetta Sharlot GRADE, NP       Or   ondansetron  (ZOFRAN ) injection 4 mg  4 mg Intravenous Q6H PRN Lord, Jamison Y, NP       ranolazine  (RANEXA ) 12 hr tablet 1,000 mg  1,000 mg Oral BID Jacquetta Sharlot GRADE, NP   1,000 mg at 11/01/24 2135   ticagrelor  (BRILINTA ) tablet 90 mg  90 mg Oral BID Lord, Jamison Y, NP   90 mg at 11/01/24 2135   PTA Medications: Medications Prior to Admission  Medication Sig Dispense Refill Last Dose/Taking   acetaminophen  (TYLENOL ) 650 MG CR tablet Take 2,600 mg by mouth every 4 (four) hours.      ALPRAZolam  (XANAX ) 1 MG tablet TAKE 1 TABLET BY MOUTH THREE TIMES DAILY AS NEEDED FOR ANXIETY 90 tablet 0    Ascorbic Acid (VITAMIN C PO) Take by mouth.      aspirin   EC 81 MG tablet Take 1 tablet (81 mg total) by mouth daily. 90 tablet 3    Cyanocobalamin  (VITAMIN B-12 PO) Take by mouth.  Evolocumab  (REPATHA  SURECLICK) 140 MG/ML SOAJ Inject 140 mg into the skin every 14 (fourteen) days. 6 mL 1    fluticasone  (FLONASE ) 50 MCG/ACT nasal spray Place 2 sprays into both nostrils daily. 16 g 1    furosemide  (LASIX ) 20 MG tablet Take 1 tablet by mouth once daily 90 tablet 1    losartan  (COZAAR ) 50 MG tablet Take 1 tablet (50 mg total) by mouth daily. 30 tablet 11    MAGNESIUM  PO Take by mouth.      mirtazapine  (REMERON ) 7.5 MG tablet Take 0.5-1 tablets (3.75-7.5 mg total) by mouth at bedtime.      nitroGLYCERIN  (NITROSTAT ) 0.4 MG SL tablet Place 1 tablet (0.4 mg total) under the tongue every 5 (five) minutes as needed for chest pain. 5 tablet 0    Omega-3 Fatty Acids (OMEGA-3 FISH OIL PO) Take 1 tablet by mouth daily.      ranolazine  (RANEXA ) 1000 MG SR tablet Take 1 tablet by mouth twice daily 180 tablet 1    sildenafil  (VIAGRA ) 100 MG tablet Take 0.5-1 tablets (50-100 mg total) by mouth daily as needed for erectile dysfunction. 30 tablet 1    ticagrelor  (BRILINTA ) 90 MG TABS tablet Take 1 tablet (90 mg total) by mouth 2 (two) times daily. 180 tablet 3    VITAMIN D  PO Take 5,000 Units by mouth daily.       Psychiatric Specialty Exam:  Presentation  General Appearance: Appropriate for Environment  Eye Contact:Fair  Speech:Clear and Coherent  Speech Volume:Normal    Mood and Affect  Mood:Anxious  Affect:Appropriate   Thought Process  Thought Processes:Coherent  Descriptions of Associations:Intact  Orientation:Full (Time, Place and Person)  Thought Content:Logical  Hallucinations:Hallucinations: None  Ideas of Reference:None  Suicidal Thoughts:Suicidal Thoughts: No  Homicidal Thoughts:Homicidal Thoughts: No   Sensorium  Memory:Immediate Fair; Remote Fair  Judgment:Impaired  Insight:Shallow   Executive Functions   Concentration:Fair  Attention Span:Poor  Recall:Fair  Fund of Knowledge:Fair  Language:Fair   Psychomotor Activity  Psychomotor Activity:Psychomotor Activity: Normal   Assets  Assets:Communication Skills; Desire for Improvement    Musculoskeletal: Strength & Muscle Tone: within normal limits Gait & Station: normal  Physical Exam: Physical Exam Vitals and nursing note reviewed.  HENT:     Head: Normocephalic.     Nose: Nose normal.     Mouth/Throat:     Mouth: Mucous membranes are moist.  Cardiovascular:     Rate and Rhythm: Normal rate.  Pulmonary:     Effort: Pulmonary effort is normal.  Abdominal:     General: Abdomen is flat.    Review of Systems  Constitutional: Negative.   HENT: Negative.    Eyes: Negative.   Cardiovascular: Negative.   Skin: Negative.    Blood pressure 125/79, pulse 71, temperature 98.4 F (36.9 C), resp. rate 16, height 5' 5 (1.651 m), weight 89.1 kg, SpO2 97%. Body mass index is 32.7 kg/m.  Principal Diagnosis: Major depressive disorder, recurrent severe without psychotic features (HCC) Diagnosis:  Principal Problem:   Major depressive disorder, recurrent severe without psychotic features [facility])   Clinical Decision Making:  Treatment Plan Summary:  Safety and Monitoring:             -- Involuntary admission to inpatient psychiatric unit for safety, stabilization and treatment             -- Daily contact with patient to assess and evaluate symptoms and progress in treatment             --  Patient's case to be discussed in multi-disciplinary team meeting             -- Observation Level: q15 minute checks             -- Vital signs:  q12 hours             -- Precautions: suicide, elopement, and assault   2. Psychiatric Diagnoses and Treatment:                xanax    -- The risks/benefits/side-effects/alternatives to this medication were discussed in detail with the patient and time was given for questions. The patient  consents to medication trial.                -- Metabolic profile and EKG monitoring obtained while on an atypical antipsychotic (BMI: Lipid Panel: HbgA1c: QTc:)              -- Encouraged patient to participate in unit milieu and in scheduled group therapies                            3. Medical Issues Being Addressed:      4. Discharge Planning:              -- Social work and case management to assist with discharge planning and identification of hospital follow-up needs prior to discharge             -- Estimated LOS: 5-7 days             -- Discharge Concerns: Need to establish a safety plan; Medication compliance and effectiveness             -- Discharge Goals: Return home with outpatient referrals follow ups  Physician Treatment Plan for Primary Diagnosis: Major depressive disorder, recurrent severe without psychotic features (HCC) Long Term Goal(s): Improvement in symptoms so as ready for discharge  Short Term Goals: Ability to identify changes in lifestyle to reduce recurrence of condition will improve, Ability to verbalize feelings will improve, Ability to disclose and discuss suicidal ideas, Ability to demonstrate self-control will improve, and Ability to identify and develop effective coping behaviors will improve  Physician Treatment Plan for Secondary Diagnosis: Principal Problem:   Major depressive disorder, recurrent severe without psychotic features (HCC)  Long Term Goal(s): Improvement in symptoms so as ready for discharge  Short Term Goals: Ability to identify changes in lifestyle to reduce recurrence of condition will improve, Ability to verbalize feelings will improve, Ability to disclose and discuss suicidal ideas, Ability to demonstrate self-control will improve, and Ability to identify and develop effective coping behaviors will improve  I certify that inpatient services furnished can reasonably be expected to improve the patient's condition.    Zaelyn Noack,  MD 12/7/202511:08 PM

## 2024-11-02 NOTE — Progress Notes (Incomplete)
   11/02/24 2100  Psych Admission Type (Psych Patients Only)  Admission Status Involuntary  Psychosocial Assessment  Patient Complaints Worrying  Eye Contact Fair  Facial Expression Animated  Affect Appropriate to circumstance  Speech Logical/coherent  Interaction Assertive  Motor Activity Other (Comment) (WNL)  Appearance/Hygiene Unremarkable  Behavior Characteristics Cooperative;Appropriate to situation  Mood Pleasant;Preoccupied  Thought Process  Coherency WDL  Content WDL  Delusions None reported or observed  Perception WDL  Hallucination None reported or observed  Judgment WDL  Confusion None  Danger to Self  Current suicidal ideation? Denies  Danger to Others  Danger to Others None reported or observed   Mood/Behavior:  Pleasant and cooperative.    Psych assessment: Denies SI/HI and AVH.  Pt expressing wanting to be discharged.     Interaction / Group attendance:  Isolative in room. Interacting with staff.  Did not attend group.   Medication/ PRNs: Compliant with scheduled medications. Required PRNs Tyelnol for blateral knee pain noted effective. Pt expressing I feel like fluid is building up in my leg and I thought  I have not been taking my Lasix .   Pain: bilateral knee pain.  15 min checks in place for safety.

## 2024-11-02 NOTE — BH IP Treatment Plan (Signed)
 Interdisciplinary Treatment and Diagnostic Plan Update  11/02/2024 Time of Session: 10:06 AM  Corey [name] MRN: [id number]  Principal Diagnosis: Major depressive disorder, recurrent severe without psychotic features (HCC)  Secondary Diagnoses: Principal Problem:   Major depressive disorder, recurrent severe without psychotic features (HCC)   Current Medications:  Current Facility-Administered Medications  Medication Dose Route Frequency Provider Last Rate Last Admin   acetaminophen  (TYLENOL ) tablet 650 mg  650 mg Oral Q6H PRN Jacquetta Sharlot GRADE, NP       albuterol  (PROVENTIL ) (2.5 MG/3ML) 0.083% nebulizer solution 2.5 mg  2.5 mg Inhalation Q6H PRN Jacquetta Sharlot GRADE, NP       ALPRAZolam  (XANAX ) tablet 0.5 mg  0.5 mg Oral BID Jacquetta Sharlot GRADE, NP   0.5 mg at 11/01/24 2134   alum & mag hydroxide-simeth (MAALOX/MYLANTA) 200-200-20 MG/5ML suspension 30 mL  30 mL Oral Q4H PRN Lord, Jamison Y, NP       aspirin  EC tablet 81 mg  81 mg Oral Daily Jacquetta Sharlot GRADE, NP   81 mg at 11/02/24 0858   cholecalciferol  (VITAMIN D3) 25 MCG (1000 UNIT) tablet 5,000 Units  5,000 Units Oral Daily Lord, Jamison Y, NP   5,000 Units at 11/02/24 0858   nitroGLYCERIN  (NITROSTAT ) SL tablet 0.4 mg  0.4 mg Sublingual Q5 min PRN Lord, Jamison Y, NP       OLANZapine  zydis (ZYPREXA ) disintegrating tablet 5 mg  5 mg Oral TID PRN Lord, Jamison Y, NP       ondansetron  (ZOFRAN ) tablet 4 mg  4 mg Oral Q6H PRN Jacquetta Sharlot GRADE, NP       Or   ondansetron  (ZOFRAN ) injection 4 mg  4 mg Intravenous Q6H PRN Lord, Jamison Y, NP       ranolazine  (RANEXA ) 12 hr tablet 1,000 mg  1,000 mg Oral BID Jacquetta Sharlot GRADE, NP   1,000 mg at 11/02/24 9140   ticagrelor  (BRILINTA ) tablet 90 mg  90 mg Oral BID Lord, Jamison Y, NP   90 mg at 11/02/24 9140   PTA Medications: Medications Prior to Admission  Medication Sig Dispense Refill Last Dose/Taking   acetaminophen  (TYLENOL ) 650 MG CR tablet Take 2,600 mg by mouth every 4 (four) hours.       ALPRAZolam  (XANAX ) 1 MG tablet TAKE 1 TABLET BY MOUTH THREE TIMES DAILY AS NEEDED FOR ANXIETY 90 tablet 0    Ascorbic Acid (VITAMIN C PO) Take by mouth.      aspirin  EC 81 MG tablet Take 1 tablet (81 mg total) by mouth daily. 90 tablet 3    Cyanocobalamin  (VITAMIN B-12 PO) Take by mouth.      Evolocumab  (REPATHA  SURECLICK) 140 MG/ML SOAJ Inject 140 mg into the skin every 14 (fourteen) days. 6 mL 1    fluticasone  (FLONASE ) 50 MCG/ACT nasal spray Place 2 sprays into both nostrils daily. 16 g 1    furosemide  (LASIX ) 20 MG tablet Take 1 tablet by mouth once daily 90 tablet 1    losartan  (COZAAR ) 50 MG tablet Take 1 tablet (50 mg total) by mouth daily. 30 tablet 11    MAGNESIUM  PO Take by mouth.      mirtazapine  (REMERON ) 7.5 MG tablet Take 0.5-1 tablets (3.75-7.5 mg total) by mouth at bedtime.      nitroGLYCERIN  (NITROSTAT ) 0.4 MG SL tablet Place 1 tablet (0.4 mg total) under the tongue every 5 (five) minutes as needed for chest pain. 5 tablet 0    Omega-3 Fatty Acids (OMEGA-3 FISH  OIL PO) Take 1 tablet by mouth daily.      ranolazine  (RANEXA ) 1000 MG SR tablet Take 1 tablet by mouth twice daily 180 tablet 1    sildenafil  (VIAGRA ) 100 MG tablet Take 0.5-1 tablets (50-100 mg total) by mouth daily as needed for erectile dysfunction. 30 tablet 1    ticagrelor  (BRILINTA ) 90 MG TABS tablet Take 1 tablet (90 mg total) by mouth 2 (two) times daily. 180 tablet 3    VITAMIN D  PO Take 5,000 Units by mouth daily.       Patient Stressors: Marital or family conflict   Other: Recent Breakup     Patient Strengths: Ability for insight  Active sense of humor  Communication skills  Supportive family/friends   Treatment Modalities: Medication Management, Group therapy, Case management,  1 to 1 session with clinician, Psychoeducation, Recreational therapy.   Physician Treatment Plan for Primary Diagnosis: Major depressive disorder, recurrent severe without psychotic features (HCC) Long Term Goal(s):  Improvement in symptoms so as ready for discharge   Short Term Goals: Ability to identify changes in lifestyle to reduce recurrence of condition will improve Ability to verbalize feelings will improve Ability to disclose and discuss suicidal ideas Ability to demonstrate self-control will improve Ability to identify and develop effective coping behaviors will improve  Medication Management: Evaluate patient's response, side effects, and tolerance of medication regimen.  Therapeutic Interventions: 1 to 1 sessions, Unit Group sessions and Medication administration.  Evaluation of Outcomes: Progressing  Physician Treatment Plan for Secondary Diagnosis: Principal Problem:   Major depressive disorder, recurrent severe without psychotic features (HCC)  Long Term Goal(s): Improvement in symptoms so as ready for discharge   Short Term Goals: Ability to identify changes in lifestyle to reduce recurrence of condition will improve Ability to verbalize feelings will improve Ability to disclose and discuss suicidal ideas Ability to demonstrate self-control will improve Ability to identify and develop effective coping behaviors will improve     Medication Management: Evaluate patient's response, side effects, and tolerance of medication regimen.  Therapeutic Interventions: 1 to 1 sessions, Unit Group sessions and Medication administration.  Evaluation of Outcomes: Progressing   RN Treatment Plan for Primary Diagnosis: Major depressive disorder, recurrent severe without psychotic features (HCC) Long Term Goal(s): Knowledge of disease and therapeutic regimen to maintain health will improve  Short Term Goals: Ability to remain free from injury will improve, Ability to verbalize frustration and anger appropriately will improve, Ability to demonstrate self-control, Ability to participate in decision making will improve, Ability to verbalize feelings will improve, Ability to disclose and discuss  suicidal ideas, Ability to identify and develop effective coping behaviors will improve, and Compliance with prescribed medications will improve  Medication Management: RN will administer medications as ordered by provider, will assess and evaluate patient's response and provide education to patient for prescribed medication. RN will report any adverse and/or side effects to prescribing provider.  Therapeutic Interventions: 1 on 1 counseling sessions, Psychoeducation, Medication administration, Evaluate responses to treatment, Monitor vital signs and CBGs as ordered, Perform/monitor CIWA, COWS, AIMS and Fall Risk screenings as ordered, Perform wound care treatments as ordered.  Evaluation of Outcomes: Progressing   LCSW Treatment Plan for Primary Diagnosis: Major depressive disorder, recurrent severe without psychotic features (HCC) Long Term Goal(s): Safe transition to appropriate next level of care at discharge, Engage patient in therapeutic group addressing interpersonal concerns.  Short Term Goals: Engage patient in aftercare planning with referrals and resources, Increase social support, Increase ability to appropriately verbalize  feelings, Increase emotional regulation, Facilitate acceptance of mental health diagnosis and concerns, Facilitate patient progression through stages of change regarding substance use diagnoses and concerns, Identify triggers associated with mental health/substance abuse issues, and Increase skills for wellness and recovery  Therapeutic Interventions: Assess for all discharge needs, 1 to 1 time with Social worker, Explore available resources and support systems, Assess for adequacy in community support network, Educate family and significant other(s) on suicide prevention, Complete Psychosocial Assessment, Interpersonal group therapy.  Evaluation of Outcomes: Progressing   Progress in Treatment: Attending groups: Yes. and No. Participating in groups: Yes. and  No. Taking medication as prescribed: Yes. Toleration medication: Yes. Family/Significant other contact made: No, will contact:  CSW will contact if given permission  Patient understands diagnosis: Yes. Discussing patient identified problems/goals with staff: Yes. Medical problems stabilized or resolved: Yes. Denies suicidal/homicidal ideation: Yes. Issues/concerns per patient self-inventory: No. Other: None   New problem(s) identified: No, Describe:  None identified   New Short Term/Long Term Goal(s):  elimination of symptoms of psychosis, medication management for mood stabilization; elimination of SI thoughts; development of comprehensive mental wellness/sobriety plan.   Patient Goals:  You've got me straightened out on all my drugs, you got me straight  Discharge Plan or Barriers: CSW will assist with appropriate discharge planning   Reason for Continuation of Hospitalization: Anxiety Depression Medication stabilization  Estimated Length of Stay: 1 to 7 days  Last 3 [place] Suicide Severity Risk Score: Flowsheet Row Admission (Current) from 10/31/2024 in [facility] ED to Hosp-Admission (Discharged) from 10/28/2024 in [facility] ED to Hosp-Admission (Discharged) from 09/25/2024 in [facility]  C-SSRS RISK CATEGORY No Risk No Risk No Risk    Last PHQ 2/9 Scores:    10/26/2024    3:23 PM 10/12/2024    4:05 PM 10/05/2024   11:07 AM  Depression screen PHQ 2/9  Decreased Interest 2 2 0  Down, Depressed, Hopeless 2  0  PHQ - 2 Score 4 2 0  Altered sleeping 2 2 0  Tired, decreased energy 1 2 0  Change in appetite 0 1 0  Feeling bad or failure about yourself  1 2 0  Trouble concentrating 1 2 0  Moving slowly or fidgety/restless 0 2 0  Suicidal thoughts 0 0 0  PHQ-9 Score 9 13 0  Difficult doing work/chores Not difficult at all Not difficult at all Not difficult at all    Scribe for Treatment Team: Lum JONETTA Croft, LCSWA 11/02/2024 2:45 PM

## 2024-11-02 NOTE — Group Note (Signed)
 Date:  11/02/2024 Time:  10:49 AM  Group Topic/Focus:  Goals/ Christmas Bingo  Group:   The focus of this group is to help patients establish daily goals to achieve during treatment and discuss how the patient can incorporate goal setting into their daily lives to aide in recovery. Patients also played 2 rounds of bingo and won cytogeneticist.     Participation Level:  Active  Participation Quality:  Appropriate  Affect:  Appropriate  Cognitive:  Appropriate  Insight: Appropriate  Engagement in Group:  Engaged  Modes of Intervention:  Activity and Discussion  Additional Comments:    Beatris ONEIDA Hasten 11/02/2024, 10:49 AM

## 2024-11-02 NOTE — Group Note (Signed)
 Date:  11/02/2024 Time:  4:40 PM  Group Topic/Focus:  Making Healthy Choices:   The focus of this group is to help patients identify negative/unhealthy choices they were using prior to admission and identify positive/healthier coping strategies to replace them upon discharge.    Participation Level:  Active  Participation Quality:  Appropriate  Affect:  Appropriate  Cognitive:  Appropriate  Insight: Appropriate  Engagement in Group:  Engaged  Modes of Intervention:  Discussion   Arland Nutting 11/02/2024, 4:40 PM

## 2024-11-02 NOTE — Group Note (Signed)
 Recreation Therapy Group Note   Group Topic:Other  Group Date: 11/02/2024 Start Time: 1415 End Time: 1500 Facilitators: Celestia Jeoffrey BRAVO, LRT, CTRS Location: Dayroom  Activity Description/Intervention: Therapeutic Drumming. Patients with peers and staff were given the opportunity to engage in a leader facilitated [facility] Group Empowerment Drumming Circle with staff from the [employer], in partnership with [employer]. Teaching laboratory technician and trained [place], Norleen Mon leading with LRT observing and documenting intervention and pt response. This evidenced-based practice targets 7 areas of health and wellbeing in the human experience including: stress-reduction, exercise, self-expression, camaraderie/support, nurturing, spirituality, and music-making (leisure).    Goal Area(s) Addresses:  Patient will engage in pro-social way in music group.  Patient will follow directions of drum leader on the first prompt. Patient will demonstrate no behavioral issues during group.  Patient will identify if a reduction in stress level occurs as a result of participation in therapeutic drum circle.   Affect/Mood: Appropriate   Participation Level: Active and Engaged   Participation Quality: Independent   Behavior: Alert and Appropriate   Speech/Thought Process: Coherent   Insight: Good   Judgement: Good   Modes of Intervention: Music   Patient Response to Interventions:  Attentive, Engaged, and Receptive   Education Outcome:  Acknowledges education   Clinical Observations/Individualized Feedback: Corey [name] was active in their participation of session activities and group discussion.    Plan: Continue to engage patient in RT group sessions 2-3x/week.   Jeoffrey BRAVO Celestia, LRT, CTRS 11/02/2024 5:03 PM

## 2024-11-02 NOTE — Plan of Care (Signed)
  Problem: Education: Goal: Ability to make informed decisions regarding treatment will improve Outcome: Progressing   

## 2024-11-02 NOTE — Progress Notes (Signed)
 [facility] MD Progress Note  11/02/2024 9:11 PM Corey [name]  MRN:  [id number] Corey [name] is a 63 y.o. male Presented to the ED on 10/28/2024  8:37 PM following an intentional overdose on Xanax . He carries the psychiatric diagnoses of Major Depressive Disorder and has a past medical history of CAD s/p cardiac stent, essential hypertension, reactive airway disease, hyperlipidemia, generalized anxiety disorder, GERD, hypogonadism in male, morbid obesity and obstructive sleep apnea on CPAP. Patient's current presentation of low mood, irritability, insomnia, and feelings of worthlessness culminating in an intentional medication overdose are consistent with a major depressive episode.Patient is admitted to [facility] unit with Q15 min safety monitoring. Multidisciplinary team approach is offered. Medication management; group/milieu therapy is offered.   Subjective:  Chart reviewed, case discussed in multidisciplinary meeting, patient seen during rounds.   Patient is noted to be participating in groups.  Patient continues to talk about his coping skills and is future oriented nests by engaging in outpatient mental health services.  Patient denies SI/HI/plan.  He is very curious to learn about cardiac problems causing mental health problems like depression and anxiety.  Patient denies auditory/visual hallucinations.  Patient continues to decline any safety concerns and the need for any adjustment or starting new medications.  He wants to engage in outpatient psychotherapy.  Past Psychiatric History: see h&P Family History:  Family History  Problem Relation Age of Onset   Diabetes Mother    Arrhythmia Mother        afib   Heart failure Mother    CAD Father 44       CABG with redo   Healthy Daughter    Social History:  Social History   Substance and Sexual Activity  Alcohol Use Yes   Comment: rare     Social History   Substance and Sexual Activity  Drug Use No    Social History    Socioeconomic History   Marital status: Widowed    Spouse name: Not on file   Number of children: Not on file   Years of education: Not on file   Highest education level: Not on file  Occupational History   Not on file  Tobacco Use   Smoking status: Former    Current packs/day: 0.00    Average packs/day: 2.0 packs/day for 30.0 years (60.0 ttl pk-yrs)    Types: Cigarettes    Start date: 05/15/1981    Quit date: 05/16/2011    Years since quitting: 13.4    Passive exposure: Past   Smokeless tobacco: Never  Vaping Use   Vaping status: Former  Substance and Sexual Activity   Alcohol use: Yes    Comment: rare   Drug use: No   Sexual activity: Not on file  Other Topics Concern   Not on file  Social History Narrative   Not on file   Social Drivers of Health   Financial Resource Strain: Not on file  Food Insecurity: No Food Insecurity (10/31/2024)   Hunger Vital Sign    Worried About Running Out of Food in the Last Year: Never true    Ran Out of Food in the Last Year: Never true  Transportation Needs: No Transportation Needs (10/31/2024)   PRAPARE - Administrator, Civil Service (Medical): No    Lack of Transportation (Non-Medical): No  Physical Activity: Not on file  Stress: Not on file  Social Connections: Not on file   Past Medical History:  Past Medical History:  Diagnosis Date   Angina pectoris 06/04/2019   Arthritis    Benign essential HTN 02/10/2016   Bronchospasm with bronchitis, acute 12/14/2013   Coronary artery disease status post PTCA and drug-eluting stent to circumflex artery in July 2020 06/26/2019   Dyslipidemia 09/24/2020   GAD (generalized anxiety disorder) 12/14/2013   GERD (gastroesophageal reflux disease)    occ   History of kidney stones    Hypertension 12/14/2013   Hypogonadism male 12/14/2013   Kidney stones 02/10/2016   Myelopathy (HCC) 06/03/2014   Obesity    OSA (obstructive sleep apnea) 02/10/2016   Shortness of breath     occ-allergies   Sleep apnea    cpap 16 yrs    Past Surgical History:  Procedure Laterality Date   ANTERIOR CERVICAL DECOMP/DISCECTOMY FUSION N/A 06/03/2014   Procedure: ANTERIOR CERVICAL DECOMPRESSION FUSION, CERVICAL FIVE-SIX, CERVICAL SIX-SEVEN WITH INSTRUMENTATION AND ALLOGRAFT;  Surgeon: Oneil Rodgers Priestly, MD;  Location: [facility] OR;  Service: Orthopedics;  Laterality: N/A;   CORONARY IMAGING/OCT N/A 09/28/2024   Procedure: CORONARY IMAGING/OCT;  Surgeon: [name], MD;  Location: [facility] INVASIVE CV LAB;  Service: Cardiovascular;  Laterality: N/A;   CORONARY STENT INTERVENTION N/A 06/19/2019   Procedure: CORONARY STENT INTERVENTION;  Surgeon: Jordan, Peter M, MD;  Location: [facility] INVASIVE CV LAB;  Service: Cardiovascular;  Laterality: N/A;   CORONARY STENT INTERVENTION N/A 09/28/2024   Procedure: CORONARY STENT INTERVENTION;  Surgeon: [name], MD;  Location: [facility] INVASIVE CV LAB;  Service: Cardiovascular;  Laterality: N/A;   I & D EXTREMITY Left 11/10/2018   Procedure: IRRIGATION AND DEBRIDEMENT EXTREMITY, REVISION AMPUTATION OF LEFT RING FINGER;  Surgeon: [name], MD;  Location: [facility] OR;  Service: Orthopedics;  Laterality: Left;   LEFT HEART CATH AND CORONARY ANGIOGRAPHY N/A 06/19/2019   Procedure: LEFT HEART CATH AND CORONARY ANGIOGRAPHY;  Surgeon: Jordan, Peter M, MD;  Location: [facility] INVASIVE CV LAB;  Service: Cardiovascular;  Laterality: N/A;   LEFT HEART CATH AND CORONARY ANGIOGRAPHY N/A 09/28/2024   Procedure: LEFT HEART CATH AND CORONARY ANGIOGRAPHY;  Surgeon: [name], MD;  Location: [facility] INVASIVE CV LAB;  Service: Cardiovascular;  Laterality: N/A;   URETHRAL DILATION     child    Current Medications: Current Facility-Administered Medications  Medication Dose Route Frequency Provider Last Rate Last Admin   acetaminophen  (TYLENOL ) tablet 650 mg  650 mg Oral Q6H PRN Lord, Jamison Y, NP       albuterol  (PROVENTIL ) (2.5 MG/3ML) 0.083% nebulizer solution 2.5 mg  2.5 mg  Inhalation Q6H PRN Jacquetta Sharlot GRADE, NP       ALPRAZolam  (XANAX ) tablet 0.5 mg  0.5 mg Oral BID Jacquetta Sharlot GRADE, NP   0.5 mg at 11/01/24 2134   alum & mag hydroxide-simeth (MAALOX/MYLANTA) 200-200-20 MG/5ML suspension 30 mL  30 mL Oral Q4H PRN Lord, Jamison Y, NP       aspirin  EC tablet 81 mg  81 mg Oral Daily Lord, Jamison Y, NP   81 mg at 11/02/24 0858   cholecalciferol  (VITAMIN D3) 25 MCG (1000 UNIT) tablet 5,000 Units  5,000 Units Oral Daily Jacquetta Sharlot GRADE, NP   5,000 Units at 11/02/24 0858   nitroGLYCERIN  (NITROSTAT ) SL tablet 0.4 mg  0.4 mg Sublingual Q5 min PRN Lord, Jamison Y, NP       OLANZapine  zydis (ZYPREXA ) disintegrating tablet 5 mg  5 mg Oral TID PRN Jacquetta Sharlot GRADE, NP       ondansetron  (ZOFRAN ) tablet 4 mg  4 mg  Oral Q6H PRN Jacquetta Sharlot GRADE, NP       Or   ondansetron  (ZOFRAN ) injection 4 mg  4 mg Intravenous Q6H PRN Lord, Jamison Y, NP       ranolazine  (RANEXA ) 12 hr tablet 1,000 mg  1,000 mg Oral BID Jacquetta Sharlot GRADE, NP   1,000 mg at 11/02/24 0859   ticagrelor  (BRILINTA ) tablet 90 mg  90 mg Oral BID Jacquetta Sharlot GRADE, NP   90 mg at 11/02/24 9140    Lab Results: No results found for this or any previous visit (from the past 48 hours).  Blood Alcohol level:  Lab Results  Component Value Date   [facility] <15 10/28/2024    Metabolic Disorder Labs: Lab Results  Component Value Date   HGBA1C 5.1 09/27/2024   MPG 99.67 09/27/2024   No results found for: PROLACTIN Lab Results  Component Value Date   CHOL 169 09/26/2024   TRIG 96 09/26/2024   HDL 37 (L) 09/26/2024   CHOLHDL 4.6 09/26/2024   VLDL 19 09/26/2024   LDLCALC 113 (H) 09/26/2024   LDLCALC 88 03/02/2024    Physical Findings: AIMS:  , ,  ,  ,    CIWA:    COWS:      Psychiatric Specialty Exam:  Presentation  General Appearance:  Appropriate for Environment  Eye Contact: Fair  Speech: Clear and Coherent  Speech Volume: Normal    Mood and Affect   Mood: Anxious  Affect: Appropriate   Thought Process  Thought Processes: Coherent  Orientation:Full (Time, Place and Person)  Thought Content:Logical  Hallucinations:Hallucinations: None  Ideas of Reference:None  Suicidal Thoughts:Suicidal Thoughts: No  Homicidal Thoughts:Homicidal Thoughts: No   Sensorium  Memory: Immediate Fair; Remote Fair  Judgment: Impaired  Insight: Shallow   Executive Functions  Concentration: Fair  Attention Span: Poor  Recall: [employer] of Knowledge: Fair  Language: Fair   Psychomotor Activity  Psychomotor Activity: Psychomotor Activity: Normal  Musculoskeletal: Strength & Muscle Tone: within normal limits Gait & Station: normal Assets  Assets: Manufacturing Systems Engineer; Desire for Improvement    Physical Exam: Physical Exam ROS Blood pressure 113/83, pulse 68, temperature 99.1 F (37.3 C), resp. rate 16, height 5' 5 (1.651 m), weight 89.1 kg, SpO2 98%. Body mass index is 32.7 kg/m.  Diagnosis: Principal Problem:   Major depressive disorder, recurrent severe without psychotic features (HCC)   PLAN: Safety and Monitoring:  -- Voluntary admission to inpatient psychiatric unit for safety, stabilization and treatment  -- Daily contact with patient to assess and evaluate symptoms and progress in treatment  -- Patient's case to be discussed in multi-disciplinary team meeting  -- Observation Level : q15 minute checks  -- Vital signs:  q12 hours  -- Precautions: suicide, elopement, and assault -- Encouraged patient to participate in unit milieu and in scheduled group therapies  2. Psychiatric Treatment:  Scheduled Medications:  Xanax  0.5 mg BID. Patient is declining any other medication adjustments as he reports side effects to all medications that he has tried so far.   -- The risks/benefits/side-effects/alternatives to this medication were discussed in detail with the patient and time was given for questions.  The patient consents to medication trial.  3. Medical Issues Being Addressed:     4. Discharge Planning:   -- Social work and case management to assist with discharge planning and identification of hospital follow-up needs prior to discharge  -- Estimated LOS: 3-4 days  Allyn Foil, MD 11/02/2024, 9:11 PM

## 2024-11-02 NOTE — Plan of Care (Signed)
  Problem: Education: Goal: Ability to make informed decisions regarding treatment will improve Outcome: Progressing   Problem: Coping: Goal: Coping ability will improve Outcome: Progressing   Problem: Health Behavior/Discharge Planning: Goal: Identification of resources available to assist in meeting health care needs will improve Outcome: Progressing   Problem: Medication: Goal: Compliance with prescribed medication regimen will improve Outcome: Progressing   Problem: Self-Concept: Goal: Ability to disclose and discuss suicidal ideas will improve Outcome: Progressing Goal: Will verbalize positive feelings about self Outcome: Progressing Note: Patient is on track. Patient will maintain adherence, avoid flare triggers, be monitored by provider to determine if a change in treatment plan is warranted, and be evaluated at upcoming provider appointment to assess progress    Problem: Education: Goal: Ability to state activities that reduce stress will improve Outcome: Progressing   Problem: Coping: Goal: Ability to identify and develop effective coping behavior will improve Outcome: Progressing   Problem: Self-Concept: Goal: Ability to identify factors that promote anxiety will improve Outcome: Progressing Goal: Level of anxiety will decrease Outcome: Progressing Goal: Ability to modify response to factors that promote anxiety will improve Outcome: Progressing

## 2024-11-02 NOTE — Group Note (Signed)
 Date:  11/02/2024 Time:  9:36 PM  Group Topic/Focus:  Wrap-Up Group:   The focus of this group is to help patients review their daily goal of treatment and discuss progress on daily workbooks.    Participation Level:  Did Not Attend  Participation Quality:     Affect:     Cognitive:     Insight: None  Engagement in Group:  None  Modes of Intervention:     Additional Comments:    Corey CHRISTELLA [name] 11/02/2024, 9:36 PM

## 2024-11-02 NOTE — Progress Notes (Signed)
 Pt denies SI/HI AVH, refused morning Xanax  this AM. Reports I only need at night. Notified MD Jadapalle. Pt engages appropriately with peers and staff. Participated in group therapy and expressed interest in discharging. Pt is future oriented,  no acute safety concerns at this time. Will continue to monitor and provide support.      11/02/24 1000  Psych Admission Type (Psych Patients Only)  Admission Status Involuntary  Psychosocial Assessment  Patient Complaints None  Eye Contact Fair  Facial Expression Other (Comment) (WNL)  Affect Appropriate to circumstance  Speech Logical/coherent  Interaction Assertive  Motor Activity Other (Comment) (WNL)  Appearance/Hygiene Unremarkable  Behavior Characteristics Cooperative;Appropriate to situation  Mood Pleasant  Thought Process  Coherency WDL  Content WDL  Delusions None reported or observed  Perception WDL  Hallucination None reported or observed  Judgment WDL  Confusion None  Danger to Self  Current suicidal ideation? Denies  Danger to Others  Danger to Others None reported or observed

## 2024-11-03 ENCOUNTER — Encounter: Admitting: Psychology

## 2024-11-03 MED ORDER — FUROSEMIDE 20 MG PO TABS
20.0000 mg | ORAL_TABLET | Freq: Every day | ORAL | Status: DC
  Administered 2024-11-03 – 2024-11-04 (×2): 20 mg via ORAL
  Filled 2024-11-03 (×2): qty 1

## 2024-11-03 NOTE — Group Note (Signed)
 Date:  11/03/2024 Time:  10:58 AM  Group Topic/Focus:  Building Self Esteem:   The Focus of this group is helping patients become aware of the effects of self-esteem on their lives, the things they and others do that enhance or undermine their self-esteem, seeing the relationship between their level of self-esteem and the choices they make and learning ways to enhance self-esteem.    Participation Level:  Active  Participation Quality:  Appropriate  Affect:  Appropriate  Cognitive:  Appropriate  Insight: Appropriate  Engagement in Group:  Engaged  Modes of Intervention:  Activity  Additional Comments:  N/A  Harlene LITTIE Gavel 11/03/2024, 10:58 AM

## 2024-11-03 NOTE — [facility] Suicide Risk Assessment (Signed)
 CSW called pt's daughter per provider's request to speak about discharge.   CSW informed pt's daughter about pt's discharge.   Pt's daughter reports that she feels pt needs an earlier appointment.   CSW will contact pt's outpatient provider [facility]).   Pt's daughter reports that she would like to speak with provider about medication changes made.   CSW informed provider.   Pt's daughter agreeable to discharge, but reports that pt's mother will pick him up from the hospital.   Lum Croft, MSW, [facility] 11/03/2024 3:33 PM

## 2024-11-03 NOTE — Group Note (Signed)
 [facility] LCSW Group Therapy Note   Group Date: 11/03/2024 Start Time: 1330 End Time: 1400   Type of Therapy/Topic:  Group Therapy:  Emotion Regulation  Participation Level:  Active   Mood:  Description of Group:    The purpose of this group is to assist patients in learning to regulate negative emotions and experience positive emotions. Patients will be guided to discuss ways in which they have been vulnerable to their negative emotions. These vulnerabilities will be juxtaposed with experiences of positive emotions or situations, and patients challenged to use positive emotions to combat negative ones. Special emphasis will be placed on coping with negative emotions in conflict situations, and patients will process healthy conflict resolution skills.  Therapeutic Goals: Patient will identify two positive emotions or experiences to reflect on in order to balance out negative emotions:  Patient will label two or more emotions that they find the most difficult to experience:  Patient will be able to demonstrate positive conflict resolution skills through discussion or role plays:   Summary of Patient Progress:   Pt was active and appropriate throughout group     Therapeutic Modalities:   Cognitive Behavioral Therapy Feelings Identification Dialectical Behavioral Therapy   Lum JONETTA Croft, LCSWA

## 2024-11-03 NOTE — Progress Notes (Signed)
   11/03/24 1500  Psych Admission Type (Psych Patients Only)  Admission Status Involuntary  Psychosocial Assessment  Patient Complaints Worrying  Eye Contact Fair  Facial Expression Animated  Affect Appropriate to circumstance  Speech Logical/coherent  Interaction Assertive  Motor Activity Other (Comment) (WNL)  Appearance/Hygiene Unremarkable  Behavior Characteristics Cooperative;Appropriate to situation  Mood Pleasant;Preoccupied  Thought Process  Coherency WDL  Content WDL  Delusions None reported or observed  Perception WDL  Hallucination None reported or observed  Judgment WDL  Confusion None  Danger to Self  Current suicidal ideation? Denies  Danger to Others  Danger to Others None reported or observed

## 2024-11-03 NOTE — Progress Notes (Signed)
 [facility] MD Progress Note  11/03/2024 10:03 PM RANIER COACH  MRN:  [id number] Corey [name] is a 63 y.o. male Presented to the ED on 10/28/2024  8:37 PM following an intentional overdose on Xanax . He carries the psychiatric diagnoses of Major Depressive Disorder and has a past medical history of CAD s/p cardiac stent, essential hypertension, reactive airway disease, hyperlipidemia, generalized anxiety disorder, GERD, hypogonadism in male, morbid obesity and obstructive sleep apnea on CPAP. Patient's current presentation of low mood, irritability, insomnia, and feelings of worthlessness culminating in an intentional medication overdose are consistent with a major depressive episode.Patient is admitted to [facility] unit with Q15 min safety monitoring. Multidisciplinary team approach is offered. Medication management; group/milieu therapy is offered.   Subjective:  Chart reviewed, case discussed in multidisciplinary meeting, patient seen during rounds.   Patient is noted to be sitting in his room.  Patient reports that he got very anxious about the morning episode of agitation by another patient.  Patient denies SI/HI/plan.  He remains discharge focused and wants to follow-up with outpatient mental health services.  He denies SI/HI/plan.  He denies auditory/visual hallucinations.  Discussed the discharge planning with the daughter. Past Psychiatric History: see h&P Family History:  Family History  Problem Relation Age of Onset   Diabetes Mother    Arrhythmia Mother        afib   Heart failure Mother    CAD Father 44       CABG with redo   Healthy Daughter    Social History:  Social History   Substance and Sexual Activity  Alcohol Use Yes   Comment: rare     Social History   Substance and Sexual Activity  Drug Use No    Social History   Socioeconomic History   Marital status: Widowed    Spouse name: Not on file   Number of children: Not on file   Years of education: Not on file    Highest education level: Not on file  Occupational History   Not on file  Tobacco Use   Smoking status: Former    Current packs/day: 0.00    Average packs/day: 2.0 packs/day for 30.0 years (60.0 ttl pk-yrs)    Types: Cigarettes    Start date: 05/15/1981    Quit date: 05/16/2011    Years since quitting: 13.4    Passive exposure: Past   Smokeless tobacco: Never  Vaping Use   Vaping status: Former  Substance and Sexual Activity   Alcohol use: Yes    Comment: rare   Drug use: No   Sexual activity: Not on file  Other Topics Concern   Not on file  Social History Narrative   Not on file   Social Drivers of Health   Financial Resource Strain: Not on file  Food Insecurity: No Food Insecurity (10/31/2024)   Hunger Vital Sign    Worried About Running Out of Food in the Last Year: Never true    Ran Out of Food in the Last Year: Never true  Transportation Needs: No Transportation Needs (10/31/2024)   PRAPARE - Administrator, Civil Service (Medical): No    Lack of Transportation (Non-Medical): No  Physical Activity: Not on file  Stress: Not on file  Social Connections: Not on file   Past Medical History:  Past Medical History:  Diagnosis Date   Angina pectoris 06/04/2019   Arthritis    Benign essential HTN 02/10/2016   Bronchospasm with bronchitis,  acute 12/14/2013   Coronary artery disease status post PTCA and drug-eluting stent to circumflex artery in July 2020 06/26/2019   Dyslipidemia 09/24/2020   GAD (generalized anxiety disorder) 12/14/2013   GERD (gastroesophageal reflux disease)    occ   History of kidney stones    Hypertension 12/14/2013   Hypogonadism male 12/14/2013   Kidney stones 02/10/2016   Myelopathy (HCC) 06/03/2014   Obesity    OSA (obstructive sleep apnea) 02/10/2016   Shortness of breath    occ-allergies   Sleep apnea    cpap 16 yrs    Past Surgical History:  Procedure Laterality Date   ANTERIOR CERVICAL DECOMP/DISCECTOMY FUSION N/A 06/03/2014    Procedure: ANTERIOR CERVICAL DECOMPRESSION FUSION, CERVICAL FIVE-SIX, CERVICAL SIX-SEVEN WITH INSTRUMENTATION AND ALLOGRAFT;  Surgeon: Oneil Rodgers Priestly, MD;  Location: [facility] OR;  Service: Orthopedics;  Laterality: N/A;   CORONARY IMAGING/OCT N/A 09/28/2024   Procedure: CORONARY IMAGING/OCT;  Surgeon: [name], MD;  Location: [facility] INVASIVE CV LAB;  Service: Cardiovascular;  Laterality: N/A;   CORONARY STENT INTERVENTION N/A 06/19/2019   Procedure: CORONARY STENT INTERVENTION;  Surgeon: Jordan, Peter M, MD;  Location: [facility] INVASIVE CV LAB;  Service: Cardiovascular;  Laterality: N/A;   CORONARY STENT INTERVENTION N/A 09/28/2024   Procedure: CORONARY STENT INTERVENTION;  Surgeon: [name], MD;  Location: [facility] INVASIVE CV LAB;  Service: Cardiovascular;  Laterality: N/A;   I & D EXTREMITY Left 11/10/2018   Procedure: IRRIGATION AND DEBRIDEMENT EXTREMITY, REVISION AMPUTATION OF LEFT RING FINGER;  Surgeon: [name], MD;  Location: [facility] OR;  Service: Orthopedics;  Laterality: Left;   LEFT HEART CATH AND CORONARY ANGIOGRAPHY N/A 06/19/2019   Procedure: LEFT HEART CATH AND CORONARY ANGIOGRAPHY;  Surgeon: Jordan, Peter M, MD;  Location: [facility] INVASIVE CV LAB;  Service: Cardiovascular;  Laterality: N/A;   LEFT HEART CATH AND CORONARY ANGIOGRAPHY N/A 09/28/2024   Procedure: LEFT HEART CATH AND CORONARY ANGIOGRAPHY;  Surgeon: [name], MD;  Location: [facility] INVASIVE CV LAB;  Service: Cardiovascular;  Laterality: N/A;   URETHRAL DILATION     child    Current Medications: Current Facility-Administered Medications  Medication Dose Route Frequency Provider Last Rate Last Admin   acetaminophen  (TYLENOL ) tablet 650 mg  650 mg Oral Q6H PRN Jacquetta Sharlot GRADE, NP   650 mg at 11/02/24 2131   albuterol  (PROVENTIL ) (2.5 MG/3ML) 0.083% nebulizer solution 2.5 mg  2.5 mg Inhalation Q6H PRN Lord, Jamison Y, NP       ALPRAZolam  (XANAX ) tablet 0.5 mg  0.5 mg Oral BID Jacquetta Sharlot GRADE, NP   0.5 mg at 11/03/24 2110    alum & mag hydroxide-simeth (MAALOX/MYLANTA) 200-200-20 MG/5ML suspension 30 mL  30 mL Oral Q4H PRN Lord, Jamison Y, NP       aspirin  EC tablet 81 mg  81 mg Oral Daily Jacquetta Sharlot GRADE, NP   81 mg at 11/03/24 1016   cholecalciferol  (VITAMIN D3) 25 MCG (1000 UNIT) tablet 5,000 Units  5,000 Units Oral Daily Jacquetta Sharlot GRADE, NP   5,000 Units at 11/03/24 1013   furosemide  (LASIX ) tablet 20 mg  20 mg Oral Daily Jackilyn Umphlett, MD   20 mg at 11/03/24 1551   nitroGLYCERIN  (NITROSTAT ) SL tablet 0.4 mg  0.4 mg Sublingual Q5 min PRN Lord, Jamison Y, NP       OLANZapine  zydis (ZYPREXA ) disintegrating tablet 5 mg  5 mg Oral TID PRN Lord, Jamison Y, NP       ondansetron  (ZOFRAN ) tablet 4 mg  4 mg Oral Q6H PRN Jacquetta Sharlot GRADE, NP       Or   ondansetron  (ZOFRAN ) injection 4 mg  4 mg Intravenous Q6H PRN Lord, Jamison Y, NP       ranolazine  (RANEXA ) 12 hr tablet 1,000 mg  1,000 mg Oral BID Jacquetta Sharlot GRADE, NP   1,000 mg at 11/03/24 2110   ticagrelor  (BRILINTA ) tablet 90 mg  90 mg Oral BID Jacquetta Sharlot GRADE, NP   90 mg at 11/03/24 2110    Lab Results: No results found for this or any previous visit (from the past 48 hours).  Blood Alcohol level:  Lab Results  Component Value Date   [facility] <15 10/28/2024    Metabolic Disorder Labs: Lab Results  Component Value Date   HGBA1C 5.1 09/27/2024   MPG 99.67 09/27/2024   No results found for: PROLACTIN Lab Results  Component Value Date   CHOL 169 09/26/2024   TRIG 96 09/26/2024   HDL 37 (L) 09/26/2024   CHOLHDL 4.6 09/26/2024   VLDL 19 09/26/2024   LDLCALC 113 (H) 09/26/2024   LDLCALC 88 03/02/2024    Physical Findings: AIMS:  , ,  ,  ,    CIWA:    COWS:      Psychiatric Specialty Exam:  Presentation  General Appearance:  Appropriate for Environment  Eye Contact: Fair  Speech: Clear and Coherent  Speech Volume: Normal    Mood and Affect  Mood: Anxious  Affect: Appropriate   Thought Process  Thought  Processes: Coherent  Orientation:Full (Time, Place and Person)  Thought Content:Logical  Hallucinations:No data recorded  Ideas of Reference:None  Suicidal Thoughts:No data recorded  Homicidal Thoughts:No data recorded   Sensorium  Memory: Immediate Fair; Remote Fair  Judgment: Impaired  Insight: Shallow   Executive Functions  Concentration: Fair  Attention Span: Poor  Recall: [employer] of Knowledge: Fair  Language: Fair   Psychomotor Activity  Psychomotor Activity: No data recorded  Musculoskeletal: Strength & Muscle Tone: within normal limits Gait & Station: normal Assets  Assets: Manufacturing Systems Engineer; Desire for Improvement    Physical Exam: Physical Exam ROS Blood pressure 120/85, pulse 75, temperature 98.5 F (36.9 C), resp. rate 16, height 5' 5 (1.651 m), weight 89.1 kg, SpO2 98%. Body mass index is 32.7 kg/m.  Diagnosis: Principal Problem:   Major depressive disorder, recurrent severe without psychotic features (HCC)   PLAN: Safety and Monitoring:  -- Voluntary admission to inpatient psychiatric unit for safety, stabilization and treatment  -- Daily contact with patient to assess and evaluate symptoms and progress in treatment  -- Patient's case to be discussed in multi-disciplinary team meeting  -- Observation Level : q15 minute checks  -- Vital signs:  q12 hours  -- Precautions: suicide, elopement, and assault -- Encouraged patient to participate in unit milieu and in scheduled group therapies  2. Psychiatric Treatment:  Scheduled Medications:  Xanax  0.5 mg BID. Patient is declining any other medication adjustments as he reports side effects to all medications that he has tried so far.   -- The risks/benefits/side-effects/alternatives to this medication were discussed in detail with the patient and time was given for questions. The patient consents to medication trial.  3. Medical Issues Being Addressed:     4. Discharge  Planning:   -- Social work and case management to assist with discharge planning and identification of hospital follow-up needs prior to discharge  -- Estimated LOS: 3-4 days  Allyn Foil, MD 11/03/2024, 10:03 PM

## 2024-11-03 NOTE — Progress Notes (Deleted)
                Corey [name]

## 2024-11-03 NOTE — Plan of Care (Signed)
   Problem: Education: Goal: Ability to make informed decisions regarding treatment will improve Outcome: Progressing   Problem: Coping: Goal: Coping ability will improve Outcome: Progressing

## 2024-11-03 NOTE — Group Note (Signed)
 Date:  11/03/2024 Time:  9:20 PM  Group Topic/Focus:  Wrap-Up Group:   The focus of this group is to help patients review their daily goal of treatment and discuss progress on daily workbooks.    Participation Level:  Active  Participation Quality:  Appropriate  Affect:  Appropriate  Cognitive:  Alert  Insight: Appropriate  Engagement in Group:  Engaged  Modes of Intervention:  Discussion  Additional Comments:    Tommas [name] 11/03/2024, 9:20 PM

## 2024-11-03 NOTE — Progress Notes (Signed)
 This encounter was created in error - please disregard.

## 2024-11-03 NOTE — Group Note (Signed)
 Recreation Therapy Group Note   Group Topic:Communication  Group Date: 11/03/2024 Start Time: 1415 End Time: 1500 Facilitators: Celestia Jeoffrey BRAVO, LRT, CTRS Location: Dayroom  Group Description: Name That Food. Pt will randomly select 1 cut paper from laminated stack of popular food images from the 1970's-1990's from LRT. Without showing the group the image they selected, pt will use descriptive words to explain what food they selected. Once the image is correctly guessed, LRT and pts will reminisce and discuss past fond memories associated with the food as well as the importance of communication.  Goal Area(s) Addressed: Patient will increase communication skills.  Patient will increase frustration tolerance skills. Patient will reminisce a fond memory in their life.     Affect/Mood: Appropriate   Participation Level: Active and Engaged   Participation Quality: Independent   Behavior: Calm and Cooperative   Speech/Thought Process: Coherent   Insight: Good   Judgement: Good   Modes of Intervention: Guided Discussion, Rapport Building, and Socialization   Patient Response to Interventions:  Attentive, Engaged, Interested , and Receptive   Education Outcome:  Acknowledges education   Clinical Observations/Individualized Feedback: Corey [name] was active in their participation of session activities and group discussion. Pt interacted well with LRT and peers duration of session.    Plan: Continue to engage patient in RT group sessions 2-3x/week.   Jeoffrey BRAVO Celestia, LRT, CTRS 11/03/2024 4:24 PM

## 2024-11-03 NOTE — Group Note (Signed)
 Date:  11/03/2024 Time:  4:22 PM  Group Topic/Focus:  Overcoming Stress:   The focus of this group is to define stress and help patients assess their triggers.    Participation Level:  Did Not Attend   Corey [name] 11/03/2024, 4:22 PM

## 2024-11-04 ENCOUNTER — Ambulatory Visit: Admitting: Family Medicine

## 2024-11-04 MED ORDER — ALBUTEROL SULFATE (2.5 MG/3ML) 0.083% IN NEBU: 2.5000 mg | INHALATION_SOLUTION | Freq: Four times a day (QID) | RESPIRATORY_TRACT | 12 refills | Status: AC | PRN

## 2024-11-04 MED ORDER — ALPRAZOLAM 0.5 MG PO TABS: 0.5000 mg | ORAL_TABLET | Freq: Two times a day (BID) | ORAL | 0 refills | Status: AC

## 2024-11-04 NOTE — [facility] Suicide Risk Assessment (Signed)
 [facility] Discharge Suicide Risk Assessment   Principal Problem: Major depressive disorder, recurrent severe without psychotic features (HCC) Discharge Diagnoses: Principal Problem:   Major depressive disorder, recurrent severe without psychotic features (HCC)   Total Time spent with patient: 30 minutes  Musculoskeletal: Strength & Muscle Tone: within normal limits Gait & Station: normal Patient leans: N/A  Psychiatric Specialty Exam  Presentation  General Appearance:  Appropriate for Environment  Eye Contact: Fair  Speech: Clear and Coherent  Speech Volume: Normal  Handedness: Right   Mood and Affect  Mood: Euthymic  Duration of Depression Symptoms: No data recorded Affect: Appropriate   Thought Process  Thought Processes: Coherent  Descriptions of Associations:Intact  Orientation:Partial  Thought Content:Logical  History of Schizophrenia/Schizoaffective disorder:No data recorded Duration of Psychotic Symptoms:No data recorded Hallucinations:Hallucinations: None  Ideas of Reference:None  Suicidal Thoughts:Suicidal Thoughts: No  Homicidal Thoughts:Homicidal Thoughts: No   Sensorium  Memory: Immediate Fair; Recent Fair  Judgment: Fair  Insight: Fair   Art Therapist  Concentration: Fair  Attention Span: Fair  Recall: Fair  Fund of Knowledge: Fair  Language: Fair   Psychomotor Activity  Psychomotor Activity: Psychomotor Activity: Normal   Assets  Assets: Desire for Improvement; Communication Skills   Sleep  Sleep: Sleep: Fair  Estimated Sleeping Duration (Last 24 Hours): 6.50-8.50 hours  Physical Exam: Physical Exam ROS Blood pressure 118/78, pulse 74, temperature 97.7 F (36.5 C), resp. rate 16, height 5' 5 (1.651 m), weight 89.1 kg, SpO2 96%. Body mass index is 32.7 kg/m.  Mental Status Per Nursing Assessment::   On Admission:  NA  Demographic Factors:  Caucasian  Loss Factors: Decrease in  vocational status  Historical Factors: Impulsivity  Risk Reduction Factors:   Living with another person, especially a relative, Positive social support, Positive therapeutic relationship, and Positive coping skills or problem solving skills  Continued Clinical Symptoms:  Depression:   Impulsivity  Cognitive Features That Contribute To Risk:  None    Suicide Risk:  Minimal: No identifiable suicidal ideation.  Patients presenting with no risk factors but with morbid ruminations; may be classified as minimal risk based on the severity of the depressive symptoms   Follow-up Information     [facility] up on 12/21/2024.   Why: Your appointment is scheduled for 12/21/24 at 9:30 AM. Contact information: [address] [place] [address] [phone]         [facility] at [facility] Go on 11/23/2024.   Why: Your appointment is scheduled for 11/23/24 at 10:00 AM for therapy. Contact information: [address], [place], [place] [address]  [phone]        [facility] at [facility] Follow up on 11/24/2024.   Why: Your appointment for psychiatry is scheduled for 11/24/24 at 11:00 AM. Contact information: [address], [place], [place] [address]  [phone]                Plan Of Care/Follow-up recommendations:  Activity:  As tolerated  Allyn Foil, MD 11/04/2024, 9:26 AM

## 2024-11-04 NOTE — Progress Notes (Addendum)
 Estimated Sleeping Duration (Last 24 Hours): 6.75-8.75 hours    11/03/24 2221  Psych Admission Type (Psych Patients Only)  Admission Status Involuntary  Psychosocial Assessment  Patient Complaints Worrying  Eye Contact Fair  Facial Expression Animated  Affect Appropriate to circumstance  Speech Logical/coherent  Interaction Assertive  Motor Activity Other (Comment)  Appearance/Hygiene Unremarkable  Behavior Characteristics Cooperative;Appropriate to situation  Mood Pleasant;Preoccupied  Thought Process  Coherency WDL  Content WDL  Delusions None reported or observed  Perception WDL  Hallucination None reported or observed  Judgment WDL  Confusion None  Danger to Self  Current suicidal ideation? Denies  Danger to Others  Danger to Others None reported or observed

## 2024-11-04 NOTE — Group Note (Signed)
 Date:  11/04/2024 Time:  10:24 AM  Group Topic/Focus:  Morning Stretch with Norleen, Movement therapy    Participation Level:  Active  Participation Quality:  Appropriate  Affect:  Appropriate  Cognitive:  Appropriate  Insight: Appropriate  Engagement in Group:  Engaged  Modes of Intervention:  Activity  Additional Comments:  none  Norleen SHAUNNA Bias 11/04/2024, 10:24 AM

## 2024-11-04 NOTE — Progress Notes (Signed)
°  [facility] Adult Case Management Discharge Plan :  Will you be returning to the same living situation after discharge:  Yes,  pt will return home  At discharge, do you have transportation home?: Yes,  pt's mother will pick him up  Do you have the ability to pay for your medications: Yes,  BLUE CROSS BLUE SHIELD / BCBS COMM PPO  Release of information consent forms completed and in the chart;  Patient's signature needed at discharge.  Patient to Follow up at:  Follow-up Information     [facility] [facility] Follow up on 12/21/2024.   Why: Your appointment is scheduled for 12/21/24 at 9:30 AM. Contact information: [address] [place] [address] [phone]         [facility] at [facility]. Go on 11/23/2024.   Why: Your appointment is scheduled for 11/23/24 at 10:00 AM for therapy. Contact information: [address], [place], [place] [address]  [phone]        [facility] Outpatient Behavioral Health at [facility] Follow up on 11/24/2024.   Why: Your appointment for psychiatry is scheduled for 11/24/24 at 11:00 AM. Contact information: [address], [place], [place] [address]  [phone]                Next level of care provider has access to [facility] Link:no  Safety Planning and Suicide Prevention discussed: Yes,  Alfredo Spong, daughter, [phone]     Has patient been referred to the Quitline?: Patient refused referral for treatment  Patient has been referred for addiction treatment: No known substance use disorder.  [address], LCSWA 11/04/2024, 9:26 AM

## 2024-11-04 NOTE — Discharge Summary (Signed)
 Physician Discharge Summary Note  Patient:  Corey [name] is an 63 y.o., male MRN:  [id number] DOB:  1961-01-11 Patient phone:  [phone] (home)  Patient address:   [address] [place] [address],   Total time spent: 40 min Date of Admission:  10/31/2024 Date of Discharge: 11/04/2024  Reason for Admission:   Corey [name] is a 63 y.o. male Presented to the ED on 10/28/2024  8:37 PM following an intentional overdose on Xanax . He carries the psychiatric diagnoses of Major Depressive Disorder and has a past medical history of CAD s/p cardiac stent, essential hypertension, reactive airway disease, hyperlipidemia, generalized anxiety disorder, GERD, hypogonadism in male, morbid obesity and obstructive sleep apnea on CPAP. Patient's current presentation of low mood, irritability, insomnia, and feelings of worthlessness culminating in an intentional medication overdose are consistent with a major depressive episode.Patient is admitted to [facility] unit with Q15 min safety monitoring. Multidisciplinary team approach is offered. Medication management; group/milieu therapy is offered.   Principal Problem: Major depressive disorder, recurrent severe without psychotic features [facility]) Discharge Diagnoses: Principal Problem:   Major depressive disorder, recurrent severe without psychotic features (HCC)   Past Psychiatric History: see h&p  Family Psychiatric  History: see h&p Social History:  Social History   Substance and Sexual Activity  Alcohol Use Yes   Comment: rare     Social History   Substance and Sexual Activity  Drug Use No    Social History   Socioeconomic History   Marital status: Widowed    Spouse name: Not on file   Number of children: Not on file   Years of education: Not on file   Highest education level: Not on file  Occupational History   Not on file  Tobacco Use   Smoking status: Former    Current packs/day: 0.00    Average packs/day: 2.0 packs/day  for 30.0 years (60.0 ttl pk-yrs)    Types: Cigarettes    Start date: 05/15/1981    Quit date: 05/16/2011    Years since quitting: 13.4    Passive exposure: Past   Smokeless tobacco: Never  Vaping Use   Vaping status: Former  Substance and Sexual Activity   Alcohol use: Yes    Comment: rare   Drug use: No   Sexual activity: Not on file  Other Topics Concern   Not on file  Social History Narrative   Not on file   Social Drivers of Health   Financial Resource Strain: Not on file  Food Insecurity: No Food Insecurity (10/31/2024)   Hunger Vital Sign    Worried About Running Out of Food in the Last Year: Never true    Ran Out of Food in the Last Year: Never true  Transportation Needs: No Transportation Needs (10/31/2024)   PRAPARE - Administrator, Civil Service (Medical): No    Lack of Transportation (Non-Medical): No  Physical Activity: Not on file  Stress: Not on file  Social Connections: Not on file   Past Medical History:  Past Medical History:  Diagnosis Date   Angina pectoris 06/04/2019   Arthritis    Benign essential HTN 02/10/2016   Bronchospasm with bronchitis, acute 12/14/2013   Coronary artery disease status post PTCA and drug-eluting stent to circumflex artery in July 2020 06/26/2019   Dyslipidemia 09/24/2020   GAD (generalized anxiety disorder) 12/14/2013   GERD (gastroesophageal reflux disease)    occ   History of kidney stones    Hypertension 12/14/2013  Hypogonadism male 12/14/2013   Kidney stones 02/10/2016   Myelopathy (HCC) 06/03/2014   Obesity    OSA (obstructive sleep apnea) 02/10/2016   Shortness of breath    occ-allergies   Sleep apnea    cpap 16 yrs    Past Surgical History:  Procedure Laterality Date   ANTERIOR CERVICAL DECOMP/DISCECTOMY FUSION N/A 06/03/2014   Procedure: ANTERIOR CERVICAL DECOMPRESSION FUSION, CERVICAL FIVE-SIX, CERVICAL SIX-SEVEN WITH INSTRUMENTATION AND ALLOGRAFT;  Surgeon: Oneil Rodgers Priestly, MD;  Location: [facility] OR;   Service: Orthopedics;  Laterality: N/A;   CORONARY IMAGING/OCT N/A 09/28/2024   Procedure: CORONARY IMAGING/OCT;  Surgeon: [name], MD;  Location: [facility] INVASIVE CV LAB;  Service: Cardiovascular;  Laterality: N/A;   CORONARY STENT INTERVENTION N/A 06/19/2019   Procedure: CORONARY STENT INTERVENTION;  Surgeon: Jordan, Peter M, MD;  Location: [facility] INVASIVE CV LAB;  Service: Cardiovascular;  Laterality: N/A;   CORONARY STENT INTERVENTION N/A 09/28/2024   Procedure: CORONARY STENT INTERVENTION;  Surgeon: [name], MD;  Location: [facility] INVASIVE CV LAB;  Service: Cardiovascular;  Laterality: N/A;   I & D EXTREMITY Left 11/10/2018   Procedure: IRRIGATION AND DEBRIDEMENT EXTREMITY, REVISION AMPUTATION OF LEFT RING FINGER;  Surgeon: [name], MD;  Location: [facility] OR;  Service: Orthopedics;  Laterality: Left;   LEFT HEART CATH AND CORONARY ANGIOGRAPHY N/A 06/19/2019   Procedure: LEFT HEART CATH AND CORONARY ANGIOGRAPHY;  Surgeon: Jordan, Peter M, MD;  Location: [facility] INVASIVE CV LAB;  Service: Cardiovascular;  Laterality: N/A;   LEFT HEART CATH AND CORONARY ANGIOGRAPHY N/A 09/28/2024   Procedure: LEFT HEART CATH AND CORONARY ANGIOGRAPHY;  Surgeon: [name], MD;  Location: [facility] INVASIVE CV LAB;  Service: Cardiovascular;  Laterality: N/A;   URETHRAL DILATION     child   Family History:  Family History  Problem Relation Age of Onset   Diabetes Mother    Arrhythmia Mother        afib   Heart failure Mother    CAD Father 58       CABG with redo   Healthy Daughter     Hospital Course:   Corey [name] is a 63 y.o. male Presented to the ED on 10/28/2024  8:37 PM following an intentional overdose on Xanax . He carries the psychiatric diagnoses of Major Depressive Disorder and has a past medical history of CAD s/p cardiac stent, essential hypertension, reactive airway disease, hyperlipidemia, generalized anxiety disorder, GERD, hypogonadism in male, morbid obesity and obstructive sleep apnea on  CPAP. Patient's current presentation of low mood, irritability, insomnia, and feelings of worthlessness culminating in an intentional medication overdose are consistent with a major depressive episode.Patient is admitted to [facility] unit with Q15 min safety monitoring. Multidisciplinary team approach is offered. Medication management; group/milieu therapy is offered.   On admission, patient and his daughter consistently declined any trial of new psychotropic medications stating he had bad side effects and there was rapid titration of medications in the last few weeks which the family thinks is responsible for the patient's mental status leading up to the admission.  Patient maintains safe behaviors on the unit.  Xanax  0.5 mg BID. Patient is declining any other medication adjustments as he reports side effects to all medications that he has tried so far.  Treatment and discharge planning was discussed with patient and his daughter.  Detailed risk assessment is complete based on clinical exam and individual risk factors and acute suicide risk is low and acute violence risk is low.  On the day of discharge, patient denies SI/HI/plan and denies hallucinations.  Patient remains future oriented and is willing to participate in outpatient mental health services.  Currently, all modifiable risk of harm to self/harm to others have been addressed and patient is no longer appropriate for the acute inpatient setting and is able to continue treatment for mental health needs in the community with the supports as indicated below.  Patient is educated and verbalized understanding of discharge plan of care including medications, follow-up appointments, mental health resources and further crisis services in the community.  He is instructed to call 911 or present to the nearest emergency room should he experience any decompensation in mood, disturbance of bowel or return of suicidal/homicidal ideations.  Patient verbalizes  understanding of this education and agrees to this plan of care  Physical Findings: AIMS:  , ,  ,  ,    CIWA:    COWS:      Psychiatric Specialty Exam:  Presentation  General Appearance:  Appropriate for Environment  Eye Contact: Fair  Speech: Clear and Coherent  Speech Volume: Normal    Mood and Affect  Mood: Euthymic  Affect: Appropriate   Thought Process  Thought Processes: Coherent  Descriptions of Associations:Intact  Orientation:Partial  Thought Content:Logical  Hallucinations:Hallucinations: None  Ideas of Reference:None  Suicidal Thoughts:Suicidal Thoughts: No  Homicidal Thoughts:Homicidal Thoughts: No   Sensorium  Memory: Immediate Fair; Recent Fair  Judgment: Fair  Insight: Fair   Art Therapist  Concentration: Fair  Attention Span: Fair  Recall: [employer] of Knowledge: Fair  Language: Fair   Psychomotor Activity  Psychomotor Activity: Psychomotor Activity: Normal  Musculoskeletal: Strength & Muscle Tone: within normal limits Gait & Station: normal Assets  Assets: Desire for Improvement; Communication Skills   Sleep  Sleep: Sleep: Fair    Physical Exam: Physical Exam Vitals and nursing note reviewed.    ROS Blood pressure 118/78, pulse 74, temperature 97.7 F (36.5 C), resp. rate 16, height 5' 5 (1.651 m), weight 89.1 kg, SpO2 96%. Body mass index is 32.7 kg/m.   Social History   Tobacco Use  Smoking Status Former   Current packs/day: 0.00   Average packs/day: 2.0 packs/day for 30.0 years (60.0 ttl pk-yrs)   Types: Cigarettes   Start date: 05/15/1981   Quit date: 05/16/2011   Years since quitting: 13.4   Passive exposure: Past  Smokeless Tobacco Never   Tobacco Cessation:  A prescription for an FDA-approved tobacco cessation medication was offered at discharge and the patient refused   Blood Alcohol level:  Lab Results  Component Value Date   [facility] <15 10/28/2024     Metabolic Disorder Labs:  Lab Results  Component Value Date   HGBA1C 5.1 09/27/2024   MPG 99.67 09/27/2024   No results found for: PROLACTIN Lab Results  Component Value Date   CHOL 169 09/26/2024   TRIG 96 09/26/2024   HDL 37 (L) 09/26/2024   CHOLHDL 4.6 09/26/2024   VLDL 19 09/26/2024   LDLCALC 113 (H) 09/26/2024   LDLCALC 88 03/02/2024    See Psychiatric Specialty Exam and Suicide Risk Assessment completed by Attending Physician prior to discharge.  Discharge destination:  Home  Is patient on multiple antipsychotic therapies at discharge:  No   Has Patient had three or more failed trials of antipsychotic monotherapy by history:  No  Recommended Plan for Multiple Antipsychotic Therapies: NA  Discharge Instructions     Diet - low sodium heart healthy   Complete  by: As directed    Increase activity slowly   Complete by: As directed       Allergies as of 11/04/2024       Reactions   Atorvastatin  Other (See Comments)   Myalgias   Prozac  [fluoxetine  Hcl] Other (See Comments)   Suicidal ideation   Rosuvastatin  Other (See Comments)   Myalgias        Medication List     STOP taking these medications    mirtazapine  7.5 MG tablet Commonly known as: REMERON    sildenafil  100 MG tablet Commonly known as: Viagra        TAKE these medications      Indication  acetaminophen  650 MG CR tablet Commonly known as: TYLENOL  Take 2,600 mg by mouth every 4 (four) hours.    albuterol  (2.5 MG/3ML) 0.083% nebulizer solution Commonly known as: PROVENTIL  Inhale 3 mLs (2.5 mg total) into the lungs every 6 (six) hours as needed for wheezing or shortness of breath.    ALPRAZolam  0.5 MG tablet Commonly known as: XANAX  Take 1 tablet (0.5 mg total) by mouth 2 (two) times daily. What changed:  medication strength how much to take when to take this reasons to take this additional instructions    aspirin  EC 81 MG tablet Take 1 tablet (81 mg total) by mouth  daily.    fluticasone  50 MCG/ACT nasal spray Commonly known as: FLONASE  Place 2 sprays into both nostrils daily.    furosemide  20 MG tablet Commonly known as: LASIX  Take 1 tablet by mouth once daily    losartan  50 MG tablet Commonly known as: Cozaar  Take 1 tablet (50 mg total) by mouth daily.    MAGNESIUM  PO Take by mouth.    nitroGLYCERIN  0.4 MG SL tablet Commonly known as: NITROSTAT  Place 1 tablet (0.4 mg total) under the tongue every 5 (five) minutes as needed for chest pain.    OMEGA-3 FISH OIL PO Take 1 tablet by mouth daily.    ranolazine  1000 MG SR tablet Commonly known as: RANEXA  Take 1 tablet by mouth twice daily    Repatha  SureClick 140 MG/ML Soaj Generic drug: Evolocumab  Inject 140 mg into the skin every 14 (fourteen) days.    ticagrelor  90 MG Tabs tablet Commonly known as: BRILINTA  Take 1 tablet (90 mg total) by mouth 2 (two) times daily.    VITAMIN B-12 PO Take by mouth.    VITAMIN C PO Take by mouth.    VITAMIN D  PO Take 5,000 Units by mouth daily.         Follow-up Information     [facility]  Outpatient Behavioral Follow up on 12/21/2024.   Why: Your appointment is scheduled for 12/21/24 at 9:30 AM. Contact information: [address] [place] [address] [phone]         [facility] Outpatient Behavioral Health at [facility]. Go on 11/23/2024.   Why: Your appointment is scheduled for 11/23/24 at 10:00 AM for therapy. Contact information: [address], [place], [place] [address]  [phone]        [facility] Outpatient Behavioral Health at [facility] Follow up on 11/24/2024.   Why: Your appointment for psychiatry is scheduled for 11/24/24 at 11:00 AM. Contact information: [address], [place], [place] [address]  [phone]                Follow-up recommendations:  Activity:  As tolerated    Signed: Adrine Hayworth, MD 11/04/2024, 9:26  AM

## 2024-11-04 NOTE — Progress Notes (Signed)
 Patient was discharged from [facility] unit at approx 1200 pm escorted by staff. Patient denies SI/HI/AVH. Discharge packet to include printed AVS, Suicide Risk Assessment, and Transition Record reviewed with patient. Belongings to include CPAP machine and dentures returned. Suicide safety plan completed with a copy kept in chart.

## 2024-11-04 NOTE — Plan of Care (Signed)
   Problem: Coping: Goal: Coping ability will improve Outcome: Progressing   Problem: Medication: Goal: Compliance with prescribed medication regimen will improve Outcome: Progressing

## 2024-11-05 ENCOUNTER — Encounter (HOSPITAL_COMMUNITY): Payer: Self-pay

## 2024-11-05 ENCOUNTER — Other Ambulatory Visit: Payer: Self-pay | Admitting: Family Medicine

## 2024-11-11 ENCOUNTER — Telehealth: Payer: Self-pay

## 2024-11-11 ENCOUNTER — Encounter: Payer: Self-pay | Admitting: Family Medicine

## 2024-11-11 ENCOUNTER — Ambulatory Visit: Admitting: Family Medicine

## 2024-11-11 ENCOUNTER — Other Ambulatory Visit (HOSPITAL_BASED_OUTPATIENT_CLINIC_OR_DEPARTMENT_OTHER): Payer: Self-pay

## 2024-11-11 VITALS — BP 124/80 | HR 75 | Temp 98.0°F | Resp 16 | Ht 65.0 in | Wt 190.0 lb

## 2024-11-11 DIAGNOSIS — F4310 Post-traumatic stress disorder, unspecified: Secondary | ICD-10-CM | POA: Insufficient documentation

## 2024-11-11 DIAGNOSIS — R531 Weakness: Secondary | ICD-10-CM | POA: Diagnosis not present

## 2024-11-11 DIAGNOSIS — M170 Bilateral primary osteoarthritis of knee: Secondary | ICD-10-CM | POA: Diagnosis not present

## 2024-11-11 LAB — BENZODIAZEPINES,MS,WB/SP RFX
7-Aminoclonazepam: NEGATIVE ng/mL
Alprazolam: 50.2 ng/mL
Benzodiazepines Confirm: POSITIVE
Chlordiazepoxide: NEGATIVE
Clonazepam: NEGATIVE ng/mL
Desalkylflurazepam: NEGATIVE ng/mL
Desmethylchlordiazepoxide: NEGATIVE
Desmethyldiazepam: NEGATIVE ng/mL
Diazepam: NEGATIVE ng/mL
Flurazepam: NEGATIVE ng/mL
Lorazepam: NEGATIVE ng/mL
Midazolam: NEGATIVE ng/mL
Oxazepam: NEGATIVE ng/mL
Temazepam: NEGATIVE ng/mL
Triazolam: NEGATIVE ng/mL

## 2024-11-11 LAB — DRUG SCREEN 10 W/CONF, SERUM
Amphetamines, IA: NEGATIVE ng/mL
Barbiturates, IA: NEGATIVE ug/mL
Benzodiazepines, IA: POSITIVE ng/mL — AB
Cocaine & Metabolite, IA: NEGATIVE ng/mL
Methadone, IA: NEGATIVE ng/mL
Opiates, IA: NEGATIVE ng/mL
Oxycodones, IA: NEGATIVE ng/mL
Phencyclidine, IA: NEGATIVE ng/mL
Propoxyphene, IA: NEGATIVE ng/mL
THC(Marijuana) Metabolite, IA: NEGATIVE ng/mL

## 2024-11-11 NOTE — Telephone Encounter (Signed)
 Copied from CRM #[id number]. Topic: Clinical - Medical Advice >> Nov 10, 2024  4:43 PM Rea ORN wrote: Reason for CRM: [name] called to advise PCP that her dad has been repeating himself a lot lately. Pt lives with his mother and she has stated he repeats him self all day long. [name] stated  he is not like himself lately and wonder if he has had a stroke or is schizophrenic. She is wanting PCP to be aware prior to his hospital follow up appt tomorrow. PT was told in the psych ward that he might have chemical imbalance due to double heart attacks. Now pt state he thinks he has PTSD. Pt whole demeanor has changed since his heart attacks. [name] is asking if he can have a brain scan or MRA.  He will see psych on 11/12/24 [name] stated everyday around 4-5pm he starts to act different like he has sun downers. He is very anxious in the evening. She said pt threw a blood clot and wonders if something is stuck in his brain.  He has a CT of brain, abdomen and pelvis but [name] never got results for them  Please call back prior to pt appt [phone]

## 2024-11-11 NOTE — Progress Notes (Signed)
 Chief Complaint  Patient presents with   Hospitalization Follow-up    Hospital Follow Up    Subjective: Patient is a 63 y.o. male here for hospital follow up.  Pt got into an accident after OD'ing on Xanax . He was transferred to the [facility].  He has no recollection of this.  He is off of most of his medications.  He found out that people do have mental health issues following heart attacks.  This has caused him great relief.  He feels better overall.  He denies any homicidal or suicidal ideation.  No self-medication.  He is taking less Xanax .  He has an appointment with his psychiatrist tomorrow.  He is unsure what he is able to do for work.  He continues to have bilateral knee pain and weakness of both of his upper extremities.  Past Medical History:  Diagnosis Date   Angina pectoris 06/04/2019   Arthritis    Benign essential HTN 02/10/2016   Bronchospasm with bronchitis, acute 12/14/2013   Coronary artery disease status post PTCA and drug-eluting stent to circumflex artery in July 2020 06/26/2019   Dyslipidemia 09/24/2020   GAD (generalized anxiety disorder) 12/14/2013   GERD (gastroesophageal reflux disease)    occ   History of kidney stones    Hypertension 12/14/2013   Hypogonadism male 12/14/2013   Kidney stones 02/10/2016   Myelopathy (HCC) 06/03/2014   Obesity    OSA (obstructive sleep apnea) 02/10/2016   Shortness of breath    occ-allergies   Sleep apnea    cpap 16 yrs    Objective: BP 124/80 (BP Location: Left Arm, Patient Position: Sitting)   Pulse 75   Temp 98 F (36.7 C) (Oral)   Resp 16   Ht 5' 5 (1.651 m)   Wt 190 lb (86.2 kg)   SpO2 96%   BMI 31.62 kg/m  General: Awake, appears stated age Lungs: No accessory muscle use Neuro: DTRs equal and symmetric throughout, no clonus, no cerebellar signs, gait is normal, 5/5 strength throughout; CN II through XII intact Psych: Age appropriate judgment and insight, normal affect and mood  Assessment and Plan: Generalized  weakness - Plan: Ambulatory referral to Physical Therapy  Primary osteoarthritis of both knees - Plan: Ambulatory referral to Physical Therapy  PTSD (post-traumatic stress disorder)  Refer to physical therapy.  Appreciate psychiatry input.  Will defer decisions with his job until he is evaluated by psychiatry. The patient voiced understanding and agreement to the plan.  I spent 30 minutes with the patient discussing the above plans in addition to reviewing his chart on the same day of the visit.  Mabel [place], DO 11/11/2024  10:31 AM

## 2024-11-11 NOTE — Patient Instructions (Addendum)
 If you do not hear anything about your referral in the next 1-2 weeks, call our office and ask for an update.  Keep the diet clean and stay active.  Your neuro exam is sound today. I am not convinced you need a scan beyond this. Your CT scans in the hospital were normal.   Let us  know if you need anything.

## 2024-11-12 ENCOUNTER — Ambulatory Visit (INDEPENDENT_AMBULATORY_CARE_PROVIDER_SITE_OTHER): Admitting: Psychiatry

## 2024-11-12 ENCOUNTER — Encounter (HOSPITAL_COMMUNITY): Payer: Self-pay | Admitting: Psychiatry

## 2024-11-12 DIAGNOSIS — F411 Generalized anxiety disorder: Secondary | ICD-10-CM | POA: Diagnosis not present

## 2024-11-12 DIAGNOSIS — F332 Major depressive disorder, recurrent severe without psychotic features: Secondary | ICD-10-CM | POA: Diagnosis not present
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
## 2024-11-13 NOTE — Telephone Encounter (Signed)
 Called pt was advised and he stated understand.

## 2024-11-13 NOTE — Telephone Encounter (Signed)
 Might have to change to Voltaren gel as that has some data for knee arthritis. Celebrex  is not a safe combo with aspirin  and Brilinta .

## 2024-11-13 NOTE — Telephone Encounter (Signed)
 Pt call about refill on celecoxib  (CELEBREX ) 200 MG and wanted to know why couldn't get it refilled.

## 2024-11-20 ENCOUNTER — Ambulatory Visit (HOSPITAL_COMMUNITY): Admitting: Licensed Clinical Social Worker

## 2024-11-20 DIAGNOSIS — F411 Generalized anxiety disorder: Secondary | ICD-10-CM

## 2024-11-20 DIAGNOSIS — F4310 Post-traumatic stress disorder, unspecified: Secondary | ICD-10-CM | POA: Diagnosis not present

## 2024-11-20 DIAGNOSIS — F332 Major depressive disorder, recurrent severe without psychotic features: Secondary | ICD-10-CM | POA: Diagnosis not present

## 2024-11-20 DIAGNOSIS — G4733 Obstructive sleep apnea (adult) (pediatric): Secondary | ICD-10-CM | POA: Diagnosis not present
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
## 2024-11-23 ENCOUNTER — Ambulatory Visit (HOSPITAL_COMMUNITY): Admitting: Licensed Clinical Social Worker

## 2024-11-24 ENCOUNTER — Ambulatory Visit (HOSPITAL_COMMUNITY): Admitting: Psychiatry

## 2024-11-30 ENCOUNTER — Ambulatory Visit: Admitting: Family Medicine

## 2024-11-30 ENCOUNTER — Encounter: Payer: Self-pay | Admitting: Family Medicine

## 2024-11-30 VITALS — BP 122/78 | HR 62 | Temp 98.0°F | Resp 16 | Ht 65.0 in | Wt 190.0 lb

## 2024-11-30 DIAGNOSIS — F411 Generalized anxiety disorder: Secondary | ICD-10-CM

## 2024-11-30 DIAGNOSIS — F332 Major depressive disorder, recurrent severe without psychotic features: Secondary | ICD-10-CM

## 2024-11-30 DIAGNOSIS — M170 Bilateral primary osteoarthritis of knee: Secondary | ICD-10-CM | POA: Diagnosis not present
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
## 2024-11-30 NOTE — Patient Instructions (Signed)
 Keep the diet clean and stay active.  Let us know if you need anything.

## 2024-11-30 NOTE — Progress Notes (Signed)
 Chief Complaint  Patient presents with   Follow-up    Follow Up     Subjective: Patient is a 64 y.o. male here for f/u.  Patient is currently following with both psychiatry and the counseling team.  He has had 1 visit with each.  Mood is much more stable.  He reports taking the alprazolam  twice daily but does not think he needs it anymore.  No homicidal or suicidal ideation.  Although that his family members frustrating him, he feels his mood is very good now.  But is not good or both of his knees.  He has end-stage arthritis of both.  He has failed physical therapy, viscosupplementation, cortisone injections, and oral medication.  He is unable to take NSAIDs due to heart disease and dual antiplatelet therapy for recent coronary event.  He is unable to have surgery until he is off of dual antiplatelet therapy.  His knee situation makes it very difficult for him to work as he cannot lift or maneuver on his hands and knees without pain.  Past Medical History:  Diagnosis Date   Angina pectoris 06/04/2019   Arthritis    Benign essential HTN 02/10/2016   Bronchospasm with bronchitis, acute 12/14/2013   Coronary artery disease status post PTCA and drug-eluting stent to circumflex artery in July 2020 06/26/2019   Dyslipidemia 09/24/2020   GAD (generalized anxiety disorder) 12/14/2013   GERD (gastroesophageal reflux disease)    occ   History of kidney stones    Hypertension 12/14/2013   Hypogonadism male 12/14/2013   Kidney stones 02/10/2016   Myelopathy (HCC) 06/03/2014   Obesity    OSA (obstructive sleep apnea) 02/10/2016   Shortness of breath    occ-allergies   Sleep apnea    cpap 16 yrs    Objective: BP 122/78 (BP Location: Left Arm, Patient Position: Sitting)   Pulse 62   Temp 98 F (36.7 C) (Oral)   Resp 16   Ht 5' 5 (1.651 m)   Wt 190 lb (86.2 kg)   SpO2 97%   BMI 31.62 kg/m  General: Awake, appears stated age Lungs:  No accessory muscle use Psych: Age appropriate judgment and  insight, normal affect and mood  Assessment and Plan: Primary osteoarthritis of both knees  Generalized anxiety disorder  Major depressive disorder, recurrent severe without psychotic features (HCC)  Appreciate the Ortho team.  Consider topical NSAIDs.  Heat, ice, Tylenol . 2/3.  He looks good today. Continue with psychiatry team for now. Stay active. Consider Xanax  prn at this point. Try to avoid aggravating situations when able.  F/u in 6 mo for a CPE or prn.  The patient voiced understanding and agreement to the plan.  I spent 30 min w the pt discussing the above plans in addition to reviewing his chart on the same day of the visit.   Corey Mt [place], DO 11/30/2024  4:48 PM

## 2024-12-01 ENCOUNTER — Ambulatory Visit

## 2024-12-03 ENCOUNTER — Ambulatory Visit (HOSPITAL_COMMUNITY): Admitting: Licensed Clinical Social Worker

## 2024-12-03 DIAGNOSIS — F411 Generalized anxiety disorder: Secondary | ICD-10-CM | POA: Diagnosis not present

## 2024-12-03 NOTE — Progress Notes (Signed)
 "  THERAPIST PROGRESS NOTE Virtual Visit via Video Note  I connected with [name] on 12/03/2024 at  3:00 PM EST by a video enabled telemedicine application and verified that I am speaking with the correct person using two identifiers.  Location: Patient: [place] was in [place]  Provider: Provider's home office   I discussed the limitations of evaluation and management by telemedicine and the availability of in person appointments. The patient expressed understanding and agreed to proceed.    I discussed the assessment and treatment plan with the patient. The patient was provided an opportunity to ask questions and all were answered. The patient agreed with the plan and demonstrated an understanding of the instructions.   The patient was advised to call back or seek an in-person evaluation if the symptoms worsen or if the condition fails to improve as anticipated.  I provided 30 minutes of non-face-to-face time during this encounter.   [name], LCSW   Participation Level: Active  Behavioral Response: CasualAlertEuthymic  Type of Therapy: Individual Therapy  Treatment Goals addressed:  Resolved     Anxiety     LTG: Orvan will score less than 5 on the Generalized Anxiety Disorder 7 Scale (GAD-7)  (Completed/Met)     Start:  11/20/24    Expected End:  06/04/25    Resolved:  12/03/24      STG: Will will complete at least 80% of assigned homework  (Completed/Met)     Start:  11/20/24    Expected End:  06/04/25    Resolved:  12/03/24      STG: Will will attend couples/family counseling sessions with partner/family member 1x per week for the next 4 weeks.  (Completed/Met)     Start:  11/20/24    Expected End:  06/04/25    Resolved:  12/03/24      STG: Will will practice problem solving skills 3 times per week for the next 4 weeks.  (Completed/Met)     Start:  11/20/24    Expected End:  06/04/25    Resolved:  12/03/24      STG: Will will reduce  frequency of avoidant behaviors by 50% as evidenced by self-report in therapy sessions (Completed/Met)     Start:  11/20/24    Expected End:  06/04/25    Resolved:  12/03/24         ProgressTowards Goals: Met  Interventions: Motivational Interviewing, Supportive, and Reframing   Suicidal/Homicidal: Nowithout intent/plan  Therapist Response:   Subjective / Objective: Tauno presented to the session alert and oriented 5. He was pleasant, cooperative, and maintained good eye contact throughout the session. He engaged well in therapy and was dressed casually. He presented with a euthymic mood and appropriate affect.  The patient arrived approximately 10 minutes late to the session due to visiting a friend who is currently hospitalized. Ahmari reported that his friend has been hospitalized for over 14 days due to complications related to a cold following surgery.  The patient reported that he met with his psychiatrist last week and does not feel that ongoing psychiatric services are necessary at this time. Brenon stated that his psychiatric hospitalization over one month ago served as a wake-up call and that he feels emotionally stable. He reported that he is no longer prescribed Xanax  and stated that discontinuation of this medication has been beneficial.  The patient adamantly denied any current suicidal or homicidal ideation. No auditory or visual hallucinations were reported. LCSW observed no indicators  of imminent safety concerns during the session.  Assessment: The patient presents with stable mood, appropriate affect, and good insight into his mental health needs. He reports symptom improvement following psychiatric hospitalization and medication changes. The patient appears to be functioning well and demonstrates readiness for discharge from therapy services at this time.  Plan / Intervention: LCSW utilized supportive therapy to provide praise and encouragement and psychodynamic  therapy to allow the patient to express thoughts, feelings, and concerns in a nonjudgmental environment. Given the patients reported stability and lack of current clinical need, LCSW and patient mutually agreed to discharge from therapy services at this time. The patient was informed that he may return for reassessment in the future should symptoms recur or services be needed.  LCSW provided crisis resources, including the 988 Suicide & Crisis Lifeline and information for Behavioral Health Urgent Care located at [address], [place], [place] . The patient verbalized understanding of available resources and agreed to seek support if needed.   Diagnosis: Generalized anxiety disorder  Collaboration of Care: Other patient to follow-up with medication provider at [facility] psychiatric outpatient services through [facility] health  Patient/Guardian was advised Release of Information must be obtained prior to any record release in order to collaborate their care with an outside provider. Patient/Guardian was advised if they have not already done so to contact the registration department to sign all necessary forms in order for us  to release information regarding their care.   Consent: Patient/Guardian gives verbal consent for treatment and assignment of benefits for services provided during this visit. Patient/Guardian expressed understanding and agreed to proceed.   [name], LCSW 12/03/2024  "

## 2024-12-07 ENCOUNTER — Other Ambulatory Visit (HOSPITAL_BASED_OUTPATIENT_CLINIC_OR_DEPARTMENT_OTHER): Payer: Self-pay

## 2024-12-07 ENCOUNTER — Other Ambulatory Visit: Payer: Self-pay

## 2024-12-08 ENCOUNTER — Ambulatory Visit: Attending: Family Medicine | Admitting: Physical Therapy

## 2024-12-08 DIAGNOSIS — M6281 Muscle weakness (generalized): Secondary | ICD-10-CM | POA: Insufficient documentation

## 2024-12-08 DIAGNOSIS — G8929 Other chronic pain: Secondary | ICD-10-CM | POA: Insufficient documentation

## 2024-12-08 DIAGNOSIS — M25562 Pain in left knee: Secondary | ICD-10-CM | POA: Insufficient documentation

## 2024-12-08 DIAGNOSIS — M25561 Pain in right knee: Secondary | ICD-10-CM | POA: Insufficient documentation

## 2024-12-08 DIAGNOSIS — R262 Difficulty in walking, not elsewhere classified: Secondary | ICD-10-CM | POA: Insufficient documentation
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
## 2024-12-09 ENCOUNTER — Ambulatory Visit

## 2024-12-09 ENCOUNTER — Other Ambulatory Visit: Payer: Self-pay

## 2024-12-09 DIAGNOSIS — M25561 Pain in right knee: Secondary | ICD-10-CM | POA: Diagnosis present

## 2024-12-09 DIAGNOSIS — M6281 Muscle weakness (generalized): Secondary | ICD-10-CM | POA: Diagnosis present

## 2024-12-09 DIAGNOSIS — G8929 Other chronic pain: Secondary | ICD-10-CM

## 2024-12-09 DIAGNOSIS — R262 Difficulty in walking, not elsewhere classified: Secondary | ICD-10-CM | POA: Diagnosis present

## 2024-12-09 DIAGNOSIS — M25562 Pain in left knee: Secondary | ICD-10-CM | POA: Diagnosis present

## 2024-12-11 ENCOUNTER — Telehealth (HOSPITAL_COMMUNITY): Admitting: Psychiatry

## 2024-12-11 ENCOUNTER — Encounter (HOSPITAL_COMMUNITY): Payer: Self-pay | Admitting: Psychiatry

## 2024-12-11 DIAGNOSIS — F411 Generalized anxiety disorder: Secondary | ICD-10-CM

## 2024-12-11 DIAGNOSIS — F332 Major depressive disorder, recurrent severe without psychotic features: Secondary | ICD-10-CM | POA: Diagnosis not present

## 2024-12-11 DIAGNOSIS — F4310 Post-traumatic stress disorder, unspecified: Secondary | ICD-10-CM
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
## 2024-12-15 ENCOUNTER — Other Ambulatory Visit: Payer: Self-pay | Admitting: Cardiology
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
## 2024-12-17 ENCOUNTER — Ambulatory Visit

## 2024-12-17 ENCOUNTER — Encounter (HOSPITAL_COMMUNITY): Payer: Self-pay | Admitting: Psychiatry

## 2024-12-17 DIAGNOSIS — M25562 Pain in left knee: Secondary | ICD-10-CM | POA: Diagnosis not present

## 2024-12-17 DIAGNOSIS — M6281 Muscle weakness (generalized): Secondary | ICD-10-CM

## 2024-12-17 DIAGNOSIS — R262 Difficulty in walking, not elsewhere classified: Secondary | ICD-10-CM

## 2024-12-17 DIAGNOSIS — G8929 Other chronic pain: Secondary | ICD-10-CM

## 2024-12-21 ENCOUNTER — Ambulatory Visit (HOSPITAL_COMMUNITY): Admitting: Student in an Organized Health Care Education/Training Program

## 2024-12-30 ENCOUNTER — Ambulatory Visit

## 2024-12-30 DIAGNOSIS — M6281 Muscle weakness (generalized): Secondary | ICD-10-CM

## 2024-12-30 DIAGNOSIS — R262 Difficulty in walking, not elsewhere classified: Secondary | ICD-10-CM

## 2024-12-30 DIAGNOSIS — G8929 Other chronic pain: Secondary | ICD-10-CM

## 2025-01-19 ENCOUNTER — Ambulatory Visit

## 2025-01-22 ENCOUNTER — Ambulatory Visit: Admitting: Cardiology
